# Patient Record
Sex: Female | Born: 1975 | Race: Black or African American | Hispanic: No | Marital: Single | Smoking: Never smoker
Health system: Southern US, Community
[De-identification: ages and names within clinical notes are randomized; demographics above are authoritative.]

## PROBLEM LIST (undated history)

## (undated) ENCOUNTER — Ambulatory Visit

## (undated) DIAGNOSIS — N390 Urinary tract infection, site not specified: Secondary | ICD-10-CM

## (undated) DIAGNOSIS — L0291 Cutaneous abscess, unspecified: Secondary | ICD-10-CM

## (undated) DIAGNOSIS — G8929 Other chronic pain: Secondary | ICD-10-CM

## (undated) DIAGNOSIS — E1140 Type 2 diabetes mellitus with diabetic neuropathy, unspecified: Secondary | ICD-10-CM

## (undated) DIAGNOSIS — D649 Anemia, unspecified: Secondary | ICD-10-CM

## (undated) DIAGNOSIS — H269 Unspecified cataract: Secondary | ICD-10-CM

## (undated) DIAGNOSIS — IMO0001 Reserved for inherently not codable concepts without codable children: Secondary | ICD-10-CM

## (undated) DIAGNOSIS — S72009A Fracture of unspecified part of neck of unspecified femur, initial encounter for closed fracture: Secondary | ICD-10-CM

## (undated) DIAGNOSIS — M549 Dorsalgia, unspecified: Secondary | ICD-10-CM

## (undated) DIAGNOSIS — B009 Herpesviral infection, unspecified: Secondary | ICD-10-CM

## (undated) DIAGNOSIS — G709 Myoneural disorder, unspecified: Secondary | ICD-10-CM

## (undated) DIAGNOSIS — M1990 Unspecified osteoarthritis, unspecified site: Secondary | ICD-10-CM

## (undated) DIAGNOSIS — F419 Anxiety disorder, unspecified: Secondary | ICD-10-CM

## (undated) DIAGNOSIS — Z9289 Personal history of other medical treatment: Secondary | ICD-10-CM

## (undated) DIAGNOSIS — T8859XA Other complications of anesthesia, initial encounter: Secondary | ICD-10-CM

## (undated) HISTORY — PX: EYE SURGERY: SHX253

## (undated) HISTORY — PX: BILATERAL SALPINGECTOMY: SHX5743

## (undated) HISTORY — PX: CATARACT EXTRACTION: SUR2

## (undated) HISTORY — PX: FRACTURE SURGERY: SHX138

---

## 2001-10-31 ENCOUNTER — Emergency Department (HOSPITAL_COMMUNITY): Admission: EM | Admit: 2001-10-31 | Discharge: 2001-10-31 | Payer: Self-pay | Admitting: Emergency Medicine

## 2001-10-31 ENCOUNTER — Encounter: Payer: Self-pay | Admitting: Emergency Medicine

## 2001-11-22 ENCOUNTER — Emergency Department (HOSPITAL_COMMUNITY): Admission: EM | Admit: 2001-11-22 | Discharge: 2001-11-23 | Payer: Self-pay | Admitting: Emergency Medicine

## 2001-12-15 ENCOUNTER — Other Ambulatory Visit: Admission: RE | Admit: 2001-12-15 | Discharge: 2001-12-15 | Payer: Self-pay | Admitting: Obstetrics and Gynecology

## 2001-12-30 ENCOUNTER — Encounter: Admission: RE | Admit: 2001-12-30 | Discharge: 2001-12-30 | Payer: Self-pay | Admitting: *Deleted

## 2002-01-06 ENCOUNTER — Encounter: Admission: RE | Admit: 2002-01-06 | Discharge: 2002-04-06 | Payer: Self-pay | Admitting: Obstetrics and Gynecology

## 2002-01-06 ENCOUNTER — Encounter: Admission: RE | Admit: 2002-01-06 | Discharge: 2002-01-06 | Payer: Self-pay | Admitting: *Deleted

## 2002-01-20 ENCOUNTER — Inpatient Hospital Stay (HOSPITAL_COMMUNITY): Admission: AD | Admit: 2002-01-20 | Discharge: 2002-01-20 | Payer: Self-pay | Admitting: Obstetrics and Gynecology

## 2002-01-21 ENCOUNTER — Encounter: Admission: RE | Admit: 2002-01-21 | Discharge: 2002-01-21 | Payer: Self-pay | Admitting: *Deleted

## 2002-01-27 ENCOUNTER — Encounter: Admission: RE | Admit: 2002-01-27 | Discharge: 2002-01-27 | Payer: Self-pay | Admitting: *Deleted

## 2002-02-02 ENCOUNTER — Encounter: Payer: Self-pay | Admitting: *Deleted

## 2002-02-02 ENCOUNTER — Ambulatory Visit (HOSPITAL_COMMUNITY): Admission: RE | Admit: 2002-02-02 | Discharge: 2002-02-02 | Payer: Self-pay | Admitting: *Deleted

## 2002-02-03 ENCOUNTER — Encounter: Admission: RE | Admit: 2002-02-03 | Discharge: 2002-02-03 | Payer: Self-pay | Admitting: *Deleted

## 2002-02-10 ENCOUNTER — Encounter: Admission: RE | Admit: 2002-02-10 | Discharge: 2002-02-10 | Payer: Self-pay | Admitting: *Deleted

## 2002-02-17 ENCOUNTER — Encounter: Admission: RE | Admit: 2002-02-17 | Discharge: 2002-02-17 | Payer: Self-pay | Admitting: *Deleted

## 2002-02-24 ENCOUNTER — Encounter: Admission: RE | Admit: 2002-02-24 | Discharge: 2002-02-24 | Payer: Self-pay | Admitting: *Deleted

## 2002-03-04 ENCOUNTER — Encounter: Admission: RE | Admit: 2002-03-04 | Discharge: 2002-03-04 | Payer: Self-pay | Admitting: *Deleted

## 2002-03-24 ENCOUNTER — Encounter: Admission: RE | Admit: 2002-03-24 | Discharge: 2002-03-24 | Payer: Self-pay | Admitting: *Deleted

## 2002-03-25 ENCOUNTER — Ambulatory Visit (HOSPITAL_COMMUNITY): Admission: RE | Admit: 2002-03-25 | Discharge: 2002-03-25 | Payer: Self-pay | Admitting: *Deleted

## 2002-04-07 ENCOUNTER — Encounter: Admission: RE | Admit: 2002-04-07 | Discharge: 2002-04-07 | Payer: Self-pay | Admitting: *Deleted

## 2002-04-21 ENCOUNTER — Encounter: Admission: RE | Admit: 2002-04-21 | Discharge: 2002-04-21 | Payer: Self-pay | Admitting: *Deleted

## 2002-05-06 ENCOUNTER — Encounter: Admission: RE | Admit: 2002-05-06 | Discharge: 2002-05-06 | Payer: Self-pay | Admitting: *Deleted

## 2002-05-07 ENCOUNTER — Ambulatory Visit (HOSPITAL_COMMUNITY): Admission: RE | Admit: 2002-05-07 | Discharge: 2002-05-07 | Payer: Self-pay | Admitting: *Deleted

## 2002-05-12 ENCOUNTER — Encounter: Admission: RE | Admit: 2002-05-12 | Discharge: 2002-05-12 | Payer: Self-pay | Admitting: *Deleted

## 2002-05-26 ENCOUNTER — Encounter: Admission: RE | Admit: 2002-05-26 | Discharge: 2002-05-26 | Payer: Self-pay | Admitting: *Deleted

## 2002-06-09 ENCOUNTER — Encounter (HOSPITAL_COMMUNITY): Admission: AD | Admit: 2002-06-09 | Discharge: 2002-07-09 | Payer: Self-pay | Admitting: *Deleted

## 2002-06-09 ENCOUNTER — Encounter: Admission: RE | Admit: 2002-06-09 | Discharge: 2002-06-09 | Payer: Self-pay | Admitting: *Deleted

## 2002-06-16 ENCOUNTER — Encounter: Admission: RE | Admit: 2002-06-16 | Discharge: 2002-06-16 | Payer: Self-pay | Admitting: *Deleted

## 2002-06-30 ENCOUNTER — Encounter: Admission: RE | Admit: 2002-06-30 | Discharge: 2002-06-30 | Payer: Self-pay | Admitting: *Deleted

## 2002-07-07 ENCOUNTER — Encounter: Admission: RE | Admit: 2002-07-07 | Discharge: 2002-07-07 | Payer: Self-pay | Admitting: *Deleted

## 2002-07-11 ENCOUNTER — Inpatient Hospital Stay (HOSPITAL_COMMUNITY): Admission: AD | Admit: 2002-07-11 | Discharge: 2002-07-16 | Payer: Self-pay | Admitting: Obstetrics and Gynecology

## 2002-07-14 ENCOUNTER — Encounter (INDEPENDENT_AMBULATORY_CARE_PROVIDER_SITE_OTHER): Payer: Self-pay | Admitting: Specialist

## 2002-07-20 ENCOUNTER — Inpatient Hospital Stay (HOSPITAL_COMMUNITY): Admission: AD | Admit: 2002-07-20 | Discharge: 2002-07-20 | Payer: Self-pay | Admitting: *Deleted

## 2002-07-25 ENCOUNTER — Inpatient Hospital Stay (HOSPITAL_COMMUNITY): Admission: AD | Admit: 2002-07-25 | Discharge: 2002-07-25 | Payer: Self-pay | Admitting: Family Medicine

## 2003-07-11 ENCOUNTER — Emergency Department (HOSPITAL_COMMUNITY): Admission: EM | Admit: 2003-07-11 | Discharge: 2003-07-11 | Payer: Self-pay | Admitting: Emergency Medicine

## 2003-07-25 ENCOUNTER — Other Ambulatory Visit: Admission: RE | Admit: 2003-07-25 | Discharge: 2003-07-25 | Payer: Self-pay | Admitting: Obstetrics and Gynecology

## 2005-01-01 ENCOUNTER — Other Ambulatory Visit: Admission: RE | Admit: 2005-01-01 | Discharge: 2005-01-01 | Payer: Self-pay | Admitting: Obstetrics and Gynecology

## 2005-03-07 ENCOUNTER — Inpatient Hospital Stay (HOSPITAL_COMMUNITY): Admission: EM | Admit: 2005-03-07 | Discharge: 2005-03-20 | Payer: Self-pay | Admitting: Emergency Medicine

## 2005-03-07 ENCOUNTER — Ambulatory Visit: Payer: Self-pay | Admitting: Physical Medicine & Rehabilitation

## 2005-03-20 ENCOUNTER — Inpatient Hospital Stay (HOSPITAL_COMMUNITY)
Admission: RE | Admit: 2005-03-20 | Discharge: 2005-03-29 | Payer: Self-pay | Admitting: Physical Medicine & Rehabilitation

## 2006-01-20 ENCOUNTER — Other Ambulatory Visit: Admission: RE | Admit: 2006-01-20 | Discharge: 2006-01-20 | Payer: Self-pay | Admitting: Obstetrics and Gynecology

## 2006-05-13 ENCOUNTER — Other Ambulatory Visit: Admission: RE | Admit: 2006-05-13 | Discharge: 2006-05-13 | Payer: Self-pay | Admitting: Obstetrics and Gynecology

## 2006-10-29 ENCOUNTER — Emergency Department (HOSPITAL_COMMUNITY): Admission: EM | Admit: 2006-10-29 | Discharge: 2006-10-29 | Payer: Self-pay | Admitting: Emergency Medicine

## 2007-01-26 ENCOUNTER — Encounter: Admission: RE | Admit: 2007-01-26 | Discharge: 2007-04-26 | Payer: Self-pay | Admitting: Obstetrics and Gynecology

## 2007-01-26 ENCOUNTER — Ambulatory Visit (HOSPITAL_COMMUNITY): Admission: RE | Admit: 2007-01-26 | Discharge: 2007-01-26 | Payer: Self-pay | Admitting: Obstetrics and Gynecology

## 2007-04-09 ENCOUNTER — Ambulatory Visit (HOSPITAL_COMMUNITY): Admission: RE | Admit: 2007-04-09 | Discharge: 2007-04-09 | Payer: Self-pay | Admitting: Obstetrics and Gynecology

## 2007-05-12 ENCOUNTER — Ambulatory Visit (HOSPITAL_COMMUNITY): Admission: RE | Admit: 2007-05-12 | Discharge: 2007-05-12 | Payer: Self-pay | Admitting: Obstetrics and Gynecology

## 2007-06-10 ENCOUNTER — Ambulatory Visit (HOSPITAL_COMMUNITY): Admission: RE | Admit: 2007-06-10 | Discharge: 2007-06-10 | Payer: Self-pay | Admitting: Obstetrics and Gynecology

## 2007-08-19 ENCOUNTER — Encounter (INDEPENDENT_AMBULATORY_CARE_PROVIDER_SITE_OTHER): Payer: Self-pay | Admitting: Obstetrics and Gynecology

## 2007-08-19 ENCOUNTER — Inpatient Hospital Stay (HOSPITAL_COMMUNITY): Admission: AD | Admit: 2007-08-19 | Discharge: 2007-08-23 | Payer: Self-pay | Admitting: Obstetrics and Gynecology

## 2007-12-27 ENCOUNTER — Emergency Department (HOSPITAL_COMMUNITY): Admission: EM | Admit: 2007-12-27 | Discharge: 2007-12-27 | Payer: Self-pay | Admitting: Emergency Medicine

## 2009-11-17 ENCOUNTER — Emergency Department (HOSPITAL_COMMUNITY): Admission: EM | Admit: 2009-11-17 | Discharge: 2009-11-17 | Payer: Self-pay | Admitting: Emergency Medicine

## 2009-11-17 ENCOUNTER — Encounter (INDEPENDENT_AMBULATORY_CARE_PROVIDER_SITE_OTHER): Payer: Self-pay | Admitting: Emergency Medicine

## 2009-11-17 ENCOUNTER — Ambulatory Visit: Payer: Self-pay | Admitting: Vascular Surgery

## 2010-10-07 ENCOUNTER — Encounter: Payer: Self-pay | Admitting: Obstetrics and Gynecology

## 2011-01-29 NOTE — Discharge Summary (Signed)
NAMEVEDANSHI, MASSARO           ACCOUNT NO.:  [id number]   MEDICAL RECORD NO.:  [id number]          PATIENT TYPE:  INP   LOCATION:  9306                          FACILITY:  [facility]   PHYSICIAN:  Naima A. Dillard, M.D. DATE OF BIRTH:  [birth year]   DATE OF ADMISSION:  08/19/2007  DATE OF DISCHARGE:  08/23/2007                               DISCHARGE SUMMARY   ADMISSION DIAGNOSES:  1. Intrauterine pregnancy at 39 weeks.  2. Insulin dependent diabetes.  3. Poor glucose control.  4. Obesity.  5. Poor compliance  6. Group B strep positive.   DISCHARGE DIAGNOSES:  1. Intrauterine pregnancy at 39 weeks.  2. Insulin dependent diabetes.  3. Poor glucose control.  4. Obesity.  5. Poor compliance  6. Group B strep positive.  7. Status post cesarean delivery of a female infant named Jamir weighing      6 pounds 10 ounces, Apgars 7 and 8.   HOSPITAL PROCEDURES:  1. Spinal anesthesia.  2. Repeat low transverse cesarean section.  3. Insulin drip  4. Blood sugar monitoring.   HOSPITAL COURSE:  The patient was admitted for repeat low transverse  cesarean section for above-listed reasons.  She had this done under  spinal anesthesia by Dr. Pennie Rushing.  AVL was 750 mL.  She was delivered of  a female infant weighing 6 pounds 10 ounces, Apgars 7 and 8, who was taken  to NICU.  She was taken to the women's unit and received routine postop  care.  Postop day #1, she was doing well, ambulating well, tolerating  food.  She did complain of some left shoulder bursitis which was  preexisting.  Blood sugars ranged 75-109.  Insulin drip was  discontinued, and she was begun on her Humulin 70/30 insulin regimen  from before.  She was seen by social work on that same day.  On postop  day #2, she continued to receive routine care.  Blood sugars ranged 67-  159.  She continued ambulation and routine care.  Postop day #3, she was  doing well, up and about with no problems.  Infant was still in NICU but  was doing  better.  JP was discontinued.  She had one episode of  hypoglycemia at 43 and insulin was decreased to 15 units in the morning  and 10 units at night, and she was also restarted on her Actos and  metformin.  Postop day #4, she was ready to go home.  Her vital signs  were stable.  Chest was clear.  Heart rate regular rate and rhythm.  Blood glucose ranged from 96-102.  Abdomen was soft and appropriately  tender.  Incision was clean, dry and intact with Steri-Strips.  JP site  was intact.  Lochia was small.  Extremities within normal limits.  She  was deemed to receive full benefit of hospital stay.  Discharged home.   CONDITION ON DISCHARGE:  Good.   DISCHARGE MEDICATIONS:  1. Motrin 600 mg p.o. q.6 h p.r.n.  2. Tylox one to two p.o. q.4 h p.r.n.  3. Actos 15 mg q.a.m.  4. Metformin 500 mg q.a.m.  5. Insulin Humulin 70/30 15 units in the morning and 10 units at      dinner.   DISCHARGE LABORATORIES:  Sodium 136, potassium 3.6, creatinine 0.6, AST  24, ALT 12, uric acid 4.9, hemoglobin 9.7, platelets 260.   DISCHARGE INSTRUCTIONS:  Per [facility] handout.   DISCHARGE FOLLOWUP:  To include visit with her internist in one week and  at [facility] in 6 weeks.      Marie L. Williams, C.N.M.      Naima A. Normand Sloop, M.D.  Electronically Signed    MLW/MEDQ  D:  08/23/2007  T:  08/24/2007  Job:  [id number]

## 2011-01-29 NOTE — Discharge Summary (Signed)
[name], [name]           ACCOUNT NO.:  [id number]   MEDICAL RECORD NO.:  [id number]          PATIENT TYPE:  INP   LOCATION:  9306                          [facility]  [facility]   PHYSICIAN:  Naima A. Dillard, M.D. DATE OF BIRTH:  Jun 06, 1976   DATE OF ADMISSION:  08/19/2007  DATE OF DISCHARGE:  08/23/2007                               DISCHARGE SUMMARY   ADMISSION DIAGNOSES:  1. Intrauterine pregnancy at [redacted] weeks gestation.  2. Insulin dependent diabetes.  3. Initiation of pregnancy with poor blood sugar control.  4. Obesity.  5. Poor compliance.  6. Group B strep positive.  7. Non-reactive NST with BPP 6/10.   DISCHARGE DIAGNOSES:  1. Intrauterine pregnancy at [redacted] weeks gestation.  2. Insulin dependent diabetes.  3. Initiation of pregnancy with poor blood sugar control.  4. Obesity.  5. Poor compliance.  6. Group B strep positive.  7. Non-reactive NST with BPP 6/10.  8. Status post Cesarean delivery of a female infant named Jamir,      weighing 6 pounds 10 ounces, Apgar's 7 and 8, who is in the NICU.   PROCEDURES:  1. Spinal anesthesia.  2. Repeat low transverse Cesarean section.  3. Insulin drip.  4. Blood sugar control.   HOSPITAL COURSE:  The patient was admitted for non-reactive NST with a  BPP score of 6/10. She was taken to the operating room where repeat low  transverse Cesarean section was performed under spinal anesthesia by Dr.  Pennie Rushing with birth of a female infant named Jamir, weighing 6 pounds 10  ounces. Apgar's 7 and 8. The baby was taken to the NICU. Mother was  taken to recovery and then to the third flood. On postoperative day 1,  she was doing well. She was pumping breast mild, ambulating without  difficulty and taking food without difficulty. She was having some  shoulder discomfort from a prior left shoulder bursitis. Labs were  within normal limits. Blood sugars ranged from 75 to 109. She was given  routine postoperative care. Insulin drip was  discontinued. She was  placed back on her Humulin insulin regimen. On postoperative day 2, she  was seen by social work for NICU support. Blood sugars ranged 67 to 159.  Vital signs were normal and she continued to receive routine  postoperative care. On postoperative day 3, she was feeling well. Infant  was doing well in the NICU. She had an episode of low blood sugar at 43  and decision was made to have her stay one more day in the hospital.  Other blood sugars were 104 to 130. Vital signs remained stable. Insulin  regimen was changed with a decrease of insulin to 15 units in the  morning and 10 units at night. Her Actos and Metformin were restarted,  as they were given before pregnancy. On postoperative day 4, she was  ready to go home. Blood sugars were ranging 96 to 102. Vital signs were  stable. Chest was clear. Heart regular rate and rhythm. Abdomen soft and  appropriately tender. Incision was clean, dry and intact with Steri-  Strips. Jackson-Pratt site was intact.  Jackson-Pratt was removed the day  before. Lochia was small. Extremities within normal limits. She was  deemed to have received full benefit of her hospital stay and was  discharged home.   CONDITION ON DISCHARGE:  Good.   DISCHARGE MEDICATIONS:  1. Motrin 600 mg p.o. q.6 hours.      Marie L. Williams, C.N.M.      Naima A. Normand Sloop, M.D.  Electronically Signed    MLW/MEDQ  D:  08/23/2007  T:  08/23/2007  Job:  [id number]

## 2011-01-29 NOTE — Op Note (Signed)
NAMEMICHAELENE, DUTAN           ACCOUNT NO.:  [id number]   MEDICAL RECORD NO.:  [id number]          PATIENT TYPE:  INP   LOCATION:  9306                          [facility]   PHYSICIAN:  Hal Morales, M.D.DATE OF BIRTH:  November 01, 1975   DATE OF PROCEDURE:  08/19/2007  DATE OF DISCHARGE:                               OPERATIVE REPORT   PREOPERATIVE DIAGNOSIS:  Intrauterine pregnancy at 53 weeks' gestation.  Insulin-dependent diabetes.  Prior cesarean section with desire for  repeat.  Abnormal antepartum testing.   POSTOPERATIVE DIAGNOSES:  Intrauterine pregnancy at 65 weeks' gestation.  Insulin-dependent diabetes.  Prior cesarean section with desire for  repeat.  Abnormal antepartum testing.   OPERATION:  Repeat low transverse cesarean section.   SURGEON:  Dr. Dierdre Forth.   FIRST ASSISTANT:  Erin Sons, certified nurse midwife.   ANESTHESIA:  Spinal.   ESTIMATED BLOOD LOSS:  750 mL.   COMPLICATIONS:  None.   FINDINGS:  The patient was delivered of a female infant whose name is  Jamir weighing 6 pounds 10.8 ounces with Apgars of 7 and 8 at one and  five minutes respectively.  The placenta contained an eccentrically  inserted three-vessel cord.  The uterus, tubes and ovaries were normal  for the gravid state.   PREOPERATIVE DISCUSSION:  The patient had been seen at [facility] for routine antepartum testing consisting of nonstress testing  which was nonreactive.  A biophysical profile was scored at 8/10.  In  light of these findings and the patient having poor control of her blood  sugars during her pregnancy at 53 weeks' gestation, the decision was  made to proceed toward delivery.  A discussion was held with the patient  with that recommendation and a discussion of the risks of anesthesia,  bleeding, infection and damage to adjacent organs was undertaken.  The  patient consented to proceed.   PROCEDURE:  The patient was taken to the operating  room after  appropriate identification and placed on the operating table.  A spinal  anesthetic was placed and she was placed in the supine position with a  left lateral tilt.  The abdomen, perineum were prepped with multiple  layers of Betadine and a Foley catheter inserted into the bladder and  connected to straight drainage.  The abdomen was draped as a sterile  field.  After assurance of adequate anesthesia the previous incision was  infiltrated with 20 mL of 0.25% Marcaine.  The incision was removed by  incising above and below the incision and sharply excising the skin and  underlying adipose tissue.  The remainder of the abdomen was opened and  the peritoneum entered.  The bladder blade was placed and the uterus was  incised approximately 2 cm above the uterovesical fold and that incision  taken laterally on either side bluntly.  The infant was delivered from  the occiput transverse position and after reduction of the loose nuchal  cord was delivered onto the mother's abdomen.  The cord was clamped and  cut and the infant handed off to the awaiting pediatricians.  The  appropriate cord blood  was drawn and the placenta noted to have  separated from the uterus and was removed from the operative field.  The  uterine incision was closed with a running interlocking suture of 0  Vicryl.  An imbricating suture of 0 Vicryl was placed.  Hemostasis was  achieved with several figure-of-eight sutures of 0 Vicryl.  Copious  irrigation was carried out.  The abdominal peritoneum was closed with a  running suture of 2-0 Vicryl.  The rectus muscles were reapproximated in  the midline with figure-of-eight suture of 2-0 Vicryl.  The rectus  fascia was closed with a running suture of 0 Vicryl and reinforced on  either side of midline with figure-of-eight sutures of 0 Vicryl.  The  subcutaneous tissue was irrigated and made hemostatic with Bovie  cautery.  The subcutaneous Jackson-Pratt drain was then  placed through a  stab incision in the left lower quadrant and sewn in with a suture of 0  silk.  The skin incision was closed with a subcuticular suture of 3-0  Monocryl.  Steri-Strips were applied.  A sterile dressing was applied  and the patient taken from the operating room to the recovery room in  satisfactory condition having time tolerated the procedure well with  sponge and instrument counts correct.   SPECIMENS TO PATHOLOGY:  Placenta.   The infant went to the NICU because of retracting and was to be observed  there.      Hal Morales, M.D.  Electronically Signed     VPH/MEDQ  D:  08/19/2007  T:  08/20/2007  Job:  [id number]
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
## 2011-02-01 NOTE — Consult Note (Signed)
   NAMEMAYTAL, MIJANGOS NO.:  [id number]   MEDICAL RECORD NO.:  [id number]                   PATIENT TYPE:   LOCATION:                                       FACILITY:   PHYSICIAN:  Nani Gasser, M.D.            DATE OF BIRTH:  Oct 30, 1975   DATE OF CONSULTATION:  DATE OF DISCHARGE:  07/16/2002                                   CONSULTATION   DISCHARGE DIAGNOSES:  1. Non-reassuring fetal stroke.  2. Insulin dependent diabetes mellitus.  3. Anemia.  4. Delivery of a single live born infant.  5. Induction of labor.   DISCHARGE MEDICATIONS:  1. Ibuprofen 600 mg one by mouth every six hours as needed.  2. Percocet 5/325 1-2 tabs every four hours as needed pain.  3. Iron tablet one by mouth twice a day.  4. Ortho-Evra patch.  5. Continue with current insulin regimen.  6. Prenatal vitamins one tab per day for six weeks.   FOLLOW UP:  She is to follow-up in six weeks at [facility] and to keep  follow-up appointment with Dr. Allyne Gee for her diabetes mellitus.   ACTIVITY:  No heavy lifting for six weeks. Nothing in vagina for six weeks.   HOSPITAL COURSE:  The patient is a 35 year old [place], Para I admitted  for induction of labor at 58 and 5/7th weeks. The patient received Cytotec  and Pitocin and induction. She also received Penicillin for Group B strep  positive status. She did also receive Stadol for pain medication and  Phenergan. She failed to progress and then was taken to the OR for low  transverse Cesarean section under spinal anesthesia. She delivered a girl  with Apgar's of 9 at one minute and 9 at five minutes. The patient also  received Cefotetan times two. The patient was also placed on __ during  labor. The patient was discharged on postoperative day three and staples  were removed at that time. The patient was bottle feeding and wanted Ortho-  Evra patch for contraception.     Nani Gasser, M.D.    CM/MEDQ  D:  11/04/2002  T:  11/04/2002  Job:  [id number]
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
## 2011-02-01 NOTE — Op Note (Signed)
   NAME:  [name], [name]                     ACCOUNT NO.:  [id number]   MEDICAL RECORD NO.:  [id number]                   PATIENT TYPE:  INP   LOCATION:  9145                                 FACILITY:  [facility]   PHYSICIAN:  Conni Elliot, M.D.             DATE OF BIRTH:  02-Nov-1975   DATE OF PROCEDURE:  07/13/2002  DATE OF DISCHARGE:  07/16/2002                                 OPERATIVE REPORT   PREOPERATIVE DIAGNOSES:  1. Repetitive variables with late component.  2. Arrest of active phase of labor.   POSTOPERATIVE DIAGNOSES:  1. Repetitive variables with late component.  2. Arrest of active phase of labor.  3. Persistent occiput posterior position.   SURGEON:  Conni Elliot, M.D.   ANESTHESIA:  Continuous lumbar epidural.   DESCRIPTION OF PROCEDURE:  The patient was brought to the operating room in  supine left lateral tilt position, given oxygen, and was prepped and draped  in a sterile fashion.  A low transverse Pfannenstiel incision was made.  Incision made through the skin and fascia, rectus muscles separated in the  midline, peritoneum entered, and bladder flap created.  A low transverse  uterine incision made.  The baby was delivered from a vertex presentation.  Cord double clamped and cut and the baby was given to the pediatrician.  The  placenta delivered spontaneously.  The uterus, bladder flap, anterior  peritoneum, fascia, and skin were closed in the usual fashion.  Estimated  blood loss less than 800 cc.  Sponge and instrument counts correct.                                                Conni Elliot, M.D.    ASG/MEDQ  D:  09/08/2002  T:  09/10/2002  Job:  [id number]

## 2011-06-24 LAB — COMPREHENSIVE METABOLIC PANEL
ALT: 12
Calcium: 8.8
Creatinine, Ser: 0.6
GFR calc non Af Amer: >60
Potassium: 3.6
Total Bilirubin: 0.4

## 2011-06-24 LAB — CBC
HCT: 29.1 — ABNORMAL LOW
Hemoglobin: 9.7 — ABNORMAL LOW
MCHC: 33.6
MCV: 79.3
Platelets: 261
RBC: 3.63 — ABNORMAL LOW
RDW: 14.8
WBC: 11.4 — ABNORMAL HIGH
WBC: 7.7

## 2011-06-24 LAB — LACTATE DEHYDROGENASE: LDH: 142

## 2011-09-17 DIAGNOSIS — Z9289 Personal history of other medical treatment: Secondary | ICD-10-CM

## 2011-09-17 HISTORY — DX: Personal history of other medical treatment: Z92.89

## 2012-06-08 ENCOUNTER — Other Ambulatory Visit: Payer: Self-pay | Admitting: Ophthalmology

## 2012-06-08 NOTE — H&P (Signed)
  Pre-operative History and Physical for Ophthalmic Surgery  [name] 06/08/2012                  Chief Complaint: Decreased vision  Diagnosis: Combined Cataract Right Eye  No known Allergies  Prior to Admission medications   Not on File    Planned Procedure:                                       Phacoemulsification, Posterior Chamber Intra-ocular Lens Right Eye                                      Acrysof MA50BM  + 16.50 Diopter PC IOL for implant   There were no vitals filed for this visit.  Pulse: 78         Temp: NE        Resp:  16       ROS: negative  No past medical history on file.  No past surgical history on file.   History   Social History  . Marital Status: Single    Spouse Name: N/A    Number of Children: N/A  . Years of Education: N/A   Occupational History  . Not on file.   Social History Main Topics  . Smoking status: Not on file  . Smokeless tobacco: Not on file  . Alcohol Use: Not on file  . Drug Use: Not on file  . Sexually Active: Not on file   Other Topics Concern  . Not on file   Social History Narrative  . No narrative on file     The following examination is for anesthesia clearance for minimally invasive Ophthalmic surgery. It is primarily to document heart and lung findings and is not intended to elucidate unknown general medical conditions inclusive of abdominal masses, lung lesions, etc.   General Constitution:  within normal limits   Alertness/Orientation:  Person, time place     yes   HEENT:  Eye Findings: Combined Cataract                    right eye  Neck: supple without masses  Chest/Lungs: clear to auscultation  Cardiac: Normal S1 and S2 without Murmur, S3 or S4  Neuro: non-focal  Impression:  Combined Cataract   Planned Procedure:  Phacoemulsification, Posterior Chamber Intraocular Lens, Right Eye    [name], MD        

## 2012-06-09 ENCOUNTER — Encounter (HOSPITAL_COMMUNITY): Payer: Self-pay | Admitting: Pharmacy Technician

## 2012-06-10 ENCOUNTER — Encounter (HOSPITAL_COMMUNITY): Payer: Self-pay

## 2012-06-12 ENCOUNTER — Ambulatory Visit (HOSPITAL_COMMUNITY): Payer: Medicaid Other | Admitting: *Deleted

## 2012-06-12 ENCOUNTER — Encounter (HOSPITAL_COMMUNITY): Payer: Self-pay | Admitting: *Deleted

## 2012-06-12 ENCOUNTER — Encounter (HOSPITAL_COMMUNITY): Payer: Self-pay | Admitting: Anesthesiology

## 2012-06-12 ENCOUNTER — Encounter (HOSPITAL_COMMUNITY)
Admission: RE | Disposition: A | Discharge status: Home / Self Care | Payer: Self-pay | Source: Ambulatory Visit | Attending: Ophthalmology

## 2012-06-12 ENCOUNTER — Ambulatory Visit (HOSPITAL_COMMUNITY)
Admission: RE | Admit: 2012-06-12 | Discharge: 2012-06-12 | Disposition: A | Discharge status: Home / Self Care | Payer: Medicaid Other | Source: Ambulatory Visit | Attending: Ophthalmology | Admitting: Ophthalmology

## 2012-06-12 DIAGNOSIS — Z794 Long term (current) use of insulin: Secondary | ICD-10-CM | POA: Insufficient documentation

## 2012-06-12 DIAGNOSIS — E119 Type 2 diabetes mellitus without complications: Secondary | ICD-10-CM | POA: Insufficient documentation

## 2012-06-12 DIAGNOSIS — H269 Unspecified cataract: Secondary | ICD-10-CM | POA: Insufficient documentation

## 2012-06-12 HISTORY — DX: Type 2 diabetes mellitus with diabetic neuropathy, unspecified: E11.40

## 2012-06-12 HISTORY — DX: Unspecified cataract: H26.9

## 2012-06-12 HISTORY — DX: Unspecified osteoarthritis, unspecified site: M19.90

## 2012-06-12 HISTORY — PX: CATARACT EXTRACTION W/PHACO: SHX586

## 2012-06-12 HISTORY — DX: Urinary tract infection, site not specified: N39.0

## 2012-06-12 HISTORY — DX: Other chronic pain: G89.29

## 2012-06-12 HISTORY — DX: Herpesviral infection, unspecified: B00.9

## 2012-06-12 HISTORY — DX: Fracture of unspecified part of neck of unspecified femur, initial encounter for closed fracture: S72.009A

## 2012-06-12 HISTORY — DX: Dorsalgia, unspecified: M54.9

## 2012-06-12 LAB — BASIC METABOLIC PANEL
BUN: 10 mg/dL (ref 6–23)
Chloride: 100 mEq/L (ref 96–112)
GFR calc Af Amer: >90 mL/min (ref >90)
Potassium: 3.6 mEq/L (ref 3.5–5.1)

## 2012-06-12 LAB — CBC
HCT: 35.1 % — ABNORMAL LOW (ref 36.0–46.0)
Hemoglobin: 11.9 g/dL — ABNORMAL LOW (ref 12.0–15.0)
WBC: 9.7 10*3/uL (ref 4.0–10.5)

## 2012-06-12 SURGERY — PHACOEMULSIFICATION, CATARACT, WITH IOL INSERTION
Anesthesia: Monitor Anesthesia Care | Site: Eye | Laterality: Right | Wound class: Clean

## 2012-06-12 MED ORDER — BACITRACIN-POLYMYXIN B 500-10000 UNIT/GM OP OINT
TOPICAL_OINTMENT | OPHTHALMIC | Status: DC | PRN
  Administered 2012-06-12: 1 via OPHTHALMIC

## 2012-06-12 MED ORDER — BUPIVACAINE HCL 0.75 % IJ SOLN
INTRAMUSCULAR | Status: AC
  Filled 2012-06-12: qty 10

## 2012-06-12 MED ORDER — SODIUM HYALURONATE 10 MG/ML IO SOLN
INTRAOCULAR | Status: AC
  Filled 2012-06-12: qty 0.85

## 2012-06-12 MED ORDER — CEFAZOLIN SUBCONJUNCTIVAL INJECTION 100 MG/0.5 ML
200.0000 mg | INJECTION | Freq: Once | SUBCONJUNCTIVAL | Status: DC
  Filled 2012-06-12: qty 1

## 2012-06-12 MED ORDER — ONDANSETRON HCL 4 MG/2ML IJ SOLN: 4.0000 mg | Freq: Once | INTRAMUSCULAR | Status: DC | PRN

## 2012-06-12 MED ORDER — TETRACAINE HCL 0.5 % OP SOLN
OPHTHALMIC | Status: AC
  Filled 2012-06-12: qty 2

## 2012-06-12 MED ORDER — MIDAZOLAM HCL 5 MG/5ML IJ SOLN
INTRAMUSCULAR | Status: DC | PRN
  Administered 2012-06-12: 1 mg via INTRAVENOUS

## 2012-06-12 MED ORDER — LIDOCAINE HCL (CARDIAC) 20 MG/ML IV SOLN
INTRAVENOUS | Status: DC | PRN
  Administered 2012-06-12: 60 mg via INTRAVENOUS

## 2012-06-12 MED ORDER — ACETAZOLAMIDE SODIUM 500 MG IJ SOLR
INTRAMUSCULAR | Status: AC
  Filled 2012-06-12: qty 500

## 2012-06-12 MED ORDER — CEFAZOLIN SUBCONJUNCTIVAL INJECTION 100 MG/0.5 ML
200.0000 mg | INJECTION | Freq: Once | SUBCONJUNCTIVAL | Status: AC
  Administered 2012-06-12: 200 mg via SUBCONJUNCTIVAL
  Filled 2012-06-12: qty 1

## 2012-06-12 MED ORDER — FENTANYL CITRATE 0.05 MG/ML IJ SOLN
INTRAMUSCULAR | Status: DC | PRN
  Administered 2012-06-12 (×2): 50 ug via INTRAVENOUS
  Administered 2012-06-12: 100 ug via INTRAVENOUS

## 2012-06-12 MED ORDER — HYPROMELLOSE (GONIOSCOPIC) 2.5 % OP SOLN
OPHTHALMIC | Status: DC | PRN
  Administered 2012-06-12: 2 [drp] via OPHTHALMIC

## 2012-06-12 MED ORDER — TETRACAINE HCL 0.5 % OP SOLN
2.0000 [drp] | OPHTHALMIC | Status: AC
  Administered 2012-06-12: 2 [drp] via OPHTHALMIC

## 2012-06-12 MED ORDER — GLYCOPYRROLATE 0.2 MG/ML IJ SOLN
INTRAMUSCULAR | Status: DC | PRN
  Administered 2012-06-12: 0.4 mg via INTRAVENOUS

## 2012-06-12 MED ORDER — TRIAMCINOLONE ACETONIDE 40 MG/ML IJ SUSP
INTRAMUSCULAR | Status: AC
  Filled 2012-06-12: qty 1

## 2012-06-12 MED ORDER — PREDNISOLONE ACETATE 1 % OP SUSP
OPHTHALMIC | Status: AC
  Filled 2012-06-12: qty 5

## 2012-06-12 MED ORDER — LIDOCAINE HCL 2 % IJ SOLN
INTRAMUSCULAR | Status: AC
  Filled 2012-06-12: qty 20

## 2012-06-12 MED ORDER — ROCURONIUM BROMIDE 100 MG/10ML IV SOLN
INTRAVENOUS | Status: DC | PRN
  Administered 2012-06-12: 35 mg via INTRAVENOUS

## 2012-06-12 MED ORDER — BSS IO SOLN
INTRAOCULAR | Status: AC
  Filled 2012-06-12: qty 500

## 2012-06-12 MED ORDER — LIDOCAINE HCL (PF) 2 % IJ SOLN
INTRAMUSCULAR | Status: DC | PRN
  Administered 2012-06-12: 2 mL

## 2012-06-12 MED ORDER — GATIFLOXACIN 0.5 % OP SOLN
1.0000 [drp] | OPHTHALMIC | Status: AC | PRN
  Administered 2012-06-12 (×3): 1 [drp] via OPHTHALMIC

## 2012-06-12 MED ORDER — HYDROMORPHONE HCL PF 1 MG/ML IJ SOLN
0.2500 mg | INTRAMUSCULAR | Status: DC | PRN
  Administered 2012-06-12: 0.5 mg via INTRAVENOUS

## 2012-06-12 MED ORDER — BUPIVACAINE HCL 0.75 % IJ SOLN
INTRAMUSCULAR | Status: DC | PRN
  Administered 2012-06-12: 2 mL

## 2012-06-12 MED ORDER — PHENYLEPHRINE HCL 2.5 % OP SOLN
1.0000 [drp] | OPHTHALMIC | Status: AC | PRN
  Administered 2012-06-12 (×3): 1 [drp] via OPHTHALMIC

## 2012-06-12 MED ORDER — EPINEPHRINE HCL 1 MG/ML IJ SOLN
INTRAMUSCULAR | Status: DC | PRN
  Administered 2012-06-12: .3 mL

## 2012-06-12 MED ORDER — WATER FOR IRRIGATION, STERILE IR SOLN
Status: DC | PRN
  Administered 2012-06-12: 300 mL

## 2012-06-12 MED ORDER — HYDROMORPHONE HCL PF 1 MG/ML IJ SOLN
INTRAMUSCULAR | Status: AC
  Filled 2012-06-12: qty 1

## 2012-06-12 MED ORDER — ONDANSETRON HCL 4 MG/2ML IJ SOLN
INTRAMUSCULAR | Status: DC | PRN
  Administered 2012-06-12: 4 mg via INTRAVENOUS

## 2012-06-12 MED ORDER — DEXAMETHASONE SODIUM PHOSPHATE 10 MG/ML IJ SOLN
INTRAMUSCULAR | Status: AC
  Filled 2012-06-12: qty 1

## 2012-06-12 MED ORDER — DEXAMETHASONE SODIUM PHOSPHATE 10 MG/ML IJ SOLN
INTRAMUSCULAR | Status: DC | PRN
  Administered 2012-06-12: 10 mg

## 2012-06-12 MED ORDER — SODIUM CHLORIDE 0.9 % IV SOLN
INTRAVENOUS | Status: DC | PRN
  Administered 2012-06-12: 08:00:00 via INTRAVENOUS

## 2012-06-12 MED ORDER — EPINEPHRINE HCL 1 MG/ML IJ SOLN
INTRAMUSCULAR | Status: AC
  Filled 2012-06-12: qty 1

## 2012-06-12 MED ORDER — BALANCED SALT IO SOLN
INTRAOCULAR | Status: DC | PRN
  Administered 2012-06-12: 15 mL via INTRAOCULAR

## 2012-06-12 MED ORDER — OXYCODONE HCL 5 MG PO TABS: 5.0000 mg | ORAL_TABLET | Freq: Once | ORAL | Status: DC | PRN

## 2012-06-12 MED ORDER — PHENYLEPHRINE HCL 2.5 % OP SOLN
OPHTHALMIC | Status: AC
  Filled 2012-06-12: qty 3

## 2012-06-12 MED ORDER — OXYCODONE HCL 5 MG/5ML PO SOLN: 5.0000 mg | Freq: Once | ORAL | Status: DC | PRN

## 2012-06-12 MED ORDER — BACITRACIN-POLYMYXIN B 500-10000 UNIT/GM OP OINT
TOPICAL_OINTMENT | OPHTHALMIC | Status: AC
  Filled 2012-06-12: qty 3.5

## 2012-06-12 MED ORDER — PROVISC 10 MG/ML IO SOLN
INTRAOCULAR | Status: DC | PRN
  Administered 2012-06-12: .85 mL via INTRAOCULAR

## 2012-06-12 MED ORDER — GATIFLOXACIN 0.5 % OP SOLN
OPHTHALMIC | Status: AC
  Filled 2012-06-12: qty 2.5

## 2012-06-12 MED ORDER — NA CHONDROIT SULF-NA HYALURON 40-30 MG/ML IO SOLN
INTRAOCULAR | Status: AC
  Filled 2012-06-12: qty 0.5

## 2012-06-12 MED ORDER — PROPOFOL 10 MG/ML IV BOLUS
INTRAVENOUS | Status: DC | PRN
  Administered 2012-06-12: 200 mg via INTRAVENOUS

## 2012-06-12 MED ORDER — NA CHONDROIT SULF-NA HYALURON 40-30 MG/ML IO SOLN
INTRAOCULAR | Status: DC | PRN
  Administered 2012-06-12: 0.5 mL via INTRAOCULAR

## 2012-06-12 MED ORDER — HYPROMELLOSE (GONIOSCOPIC) 2.5 % OP SOLN
OPHTHALMIC | Status: AC
  Filled 2012-06-12: qty 15

## 2012-06-12 MED ORDER — NEOSTIGMINE METHYLSULFATE 1 MG/ML IJ SOLN
INTRAMUSCULAR | Status: DC | PRN
  Administered 2012-06-12: 3 mg via INTRAVENOUS

## 2012-06-12 MED ORDER — PREDNISOLONE ACETATE 1 % OP SUSP
1.0000 [drp] | OPHTHALMIC | Status: AC
  Administered 2012-06-12: 1 [drp] via OPHTHALMIC

## 2012-06-12 SURGICAL SUPPLY — 64 items
APL SRG 3 HI ABS STRL LF PLS (MISCELLANEOUS) ×1
APPLICATOR COTTON TIP 6IN STRL (MISCELLANEOUS) ×2 IMPLANT
APPLICATOR DR MATTHEWS STRL (MISCELLANEOUS) ×2 IMPLANT
BAG FLD CLT MN 6.25X3.5 (WOUND CARE) ×1
BAG MINI COLL DRAIN (WOUND CARE) ×2 IMPLANT
BLADE EYE MINI 60D BEAVER (BLADE) IMPLANT
BLADE KERATOME 2.75 (BLADE) ×2 IMPLANT
BLADE STAB KNIFE 15DEG (BLADE) IMPLANT
CANNULA ANTERIOR CHAMBER 27GA (MISCELLANEOUS) IMPLANT
CLOTH BEACON ORANGE TIMEOUT ST (SAFETY) ×2 IMPLANT
DRAPE OPHTHALMIC 77X100 STRL (CUSTOM PROCEDURE TRAY) ×2 IMPLANT
DRAPE POUCH INSTRU U-SHP 10X18 (DRAPES) ×2 IMPLANT
DRSG TEGADERM 4X4.75 (GAUZE/BANDAGES/DRESSINGS) ×2 IMPLANT
FILTER BLUE MILLIPORE (MISCELLANEOUS) IMPLANT
GLOVE BIO SURGEON STRL SZ7.5 (GLOVE) ×1 IMPLANT
GLOVE BIOGEL PI IND STRL 7.5 (GLOVE) IMPLANT
GLOVE BIOGEL PI INDICATOR 7.5 (GLOVE) ×1
GLOVE SS BIOGEL STRL SZ 6.5 (GLOVE) ×1 IMPLANT
GLOVE SUPERSENSE BIOGEL SZ 6.5 (GLOVE) ×1
GOWN SRG XL XLNG 56XLVL 4 (GOWN DISPOSABLE) ×1 IMPLANT
GOWN STRL NON-REIN LRG LVL3 (GOWN DISPOSABLE) ×3 IMPLANT
GOWN STRL NON-REIN XL XLG LVL4 (GOWN DISPOSABLE) ×2
KIT BASIN OR (CUSTOM PROCEDURE TRAY) ×2 IMPLANT
KIT ROOM TURNOVER OR (KITS) IMPLANT
KNIFE GRIESHABER SHARP 2.5MM (MISCELLANEOUS) ×2 IMPLANT
LENS INTRAOCULAR MA50BM 20.0 (Intraocular Lens) ×1 IMPLANT
NDL 18GX1X1/2 (RX/OR ONLY) (NEEDLE) IMPLANT
NDL 25GX 5/8IN NON SAFETY (NEEDLE) ×1 IMPLANT
NDL FILTER BLUNT 18X1 1/2 (NEEDLE) IMPLANT
NDL HYPO 30X.5 LL (NEEDLE) ×2 IMPLANT
NEEDLE 18GX1X1/2 (RX/OR ONLY) (NEEDLE) ×2 IMPLANT
NEEDLE 22X1 1/2 (OR ONLY) (NEEDLE) ×2 IMPLANT
NEEDLE 25GX 5/8IN NON SAFETY (NEEDLE) ×2 IMPLANT
NEEDLE FILTER BLUNT 18X 1/2SAF (NEEDLE) ×1
NEEDLE FILTER BLUNT 18X1 1/2 (NEEDLE) ×1 IMPLANT
NEEDLE HYPO 30X.5 LL (NEEDLE) ×4 IMPLANT
NS IRRIG 1000ML POUR BTL (IV SOLUTION) ×2 IMPLANT
PACK CATARACT CUSTOM (CUSTOM PROCEDURE TRAY) ×2 IMPLANT
PACK CATARACT MCHSCP (PACKS) ×2 IMPLANT
PACK COMBINED CATERACT/VIT 23G (OPHTHALMIC RELATED) IMPLANT
PAD ARMBOARD 7.5X6 YLW CONV (MISCELLANEOUS) ×3 IMPLANT
PAD EYE OVAL STERILE LF (GAUZE/BANDAGES/DRESSINGS) ×1 IMPLANT
PHACO TIP KELMAN 45DEG (TIP) ×2 IMPLANT
PROBE ANTERIOR 20G W/INFUS NDL (MISCELLANEOUS) IMPLANT
ROLLS DENTAL (MISCELLANEOUS) IMPLANT
SHUTTLE MONARCH TYPE A (NEEDLE) ×2 IMPLANT
SOLUTION ANTI FOG 6CC (MISCELLANEOUS) IMPLANT
SPEAR EYE SURG WECK-CEL (MISCELLANEOUS) ×2 IMPLANT
SUT ETHILON 10-0 CS-B-6CS-B-6 (SUTURE)
SUT ETHILON 5 0 P 3 18 (SUTURE)
SUT ETHILON 9 0 TG140 8 (SUTURE) IMPLANT
SUT NYLON ETHILON 5-0 P-3 1X18 (SUTURE) IMPLANT
SUT PLAIN 6 0 TG1408 (SUTURE) IMPLANT
SUT POLY NON ABSORB 10-0 8 STR (SUTURE) IMPLANT
SUT VICRYL 6 0 S 29 12 (SUTURE) IMPLANT
SUTURE EHLN 10-0 CS-B-6CS-B-6 (SUTURE) IMPLANT
SYR 20CC LL (SYRINGE) IMPLANT
SYR 5ML LL (SYRINGE) IMPLANT
SYR TB 1ML LUER SLIP (SYRINGE) ×1 IMPLANT
SYRINGE 10CC LL (SYRINGE) IMPLANT
TAPE PAPER MEDFIX 1IN X 10YD (GAUZE/BANDAGES/DRESSINGS) ×1 IMPLANT
TOWEL OR 17X24 6PK STRL BLUE (TOWEL DISPOSABLE) ×4 IMPLANT
WATER STERILE IRR 1000ML POUR (IV SOLUTION) ×2 IMPLANT
WIPE INSTRUMENT VISIWIPE 73X73 (MISCELLANEOUS) ×2 IMPLANT

## 2012-06-12 NOTE — Anesthesia Preprocedure Evaluation (Addendum)
Anesthesia Evaluation  Patient identified by MRN, date of birth, ID band Patient awake    Reviewed: Allergy & Precautions, H&P , NPO status , Patient's Chart, lab work & pertinent test results  Airway Mallampati: II TM Distance: >3 FB Neck ROM: Full    Dental  (+) Teeth Intact and Dental Advisory Given   Pulmonary  breath sounds clear to auscultation  Pulmonary exam normal       Cardiovascular Rhythm:Regular Rate:Normal     Neuro/Psych    GI/Hepatic   Endo/Other  diabetes, Well Controlled, Type 2, Insulin DependentMorbid obesity  Renal/GU      Musculoskeletal   Abdominal Normal abdominal exam  (+)   Peds  Hematology   Anesthesia Other Findings   Reproductive/Obstetrics                          Anesthesia Physical Anesthesia Plan  ASA: III  Anesthesia Plan: General   Post-op Pain Management:    Induction: Intravenous  Airway Management Planned: Oral ETT  Additional Equipment:   Intra-op Plan:   Post-operative Plan: Extubation in OR  Informed Consent: I have reviewed the patients History and Physical, chart, labs and discussed the procedure including the risks, benefits and alternatives for the proposed anesthesia with the patient or authorized representative who has indicated his/her understanding and acceptance.   Dental advisory given  Plan Discussed with: CRNA, Anesthesiologist and Surgeon  Anesthesia Plan Comments:       Anesthesia Quick Evaluation

## 2012-06-12 NOTE — H&P (View-Only) (Signed)
  Pre-operative History and Physical for Ophthalmic Surgery  [name] 06/08/2012                  Chief Complaint: Decreased vision  Diagnosis: Combined Cataract Right Eye  No known Allergies  Prior to Admission medications   Not on File    Planned Procedure:                                       Phacoemulsification, Posterior Chamber Intra-ocular Lens Right Eye                                      Acrysof MA50BM  + 16.50 Diopter PC IOL for implant   There were no vitals filed for this visit.  Pulse: 78         Temp: NE        Resp:  16       ROS: negative  No past medical history on file.  No past surgical history on file.   History   Social History  . Marital Status: Single    Spouse Name: N/A    Number of Children: N/A  . Years of Education: N/A   Occupational History  . Not on file.   Social History Main Topics  . Smoking status: Not on file  . Smokeless tobacco: Not on file  . Alcohol Use: Not on file  . Drug Use: Not on file  . Sexually Active: Not on file   Other Topics Concern  . Not on file   Social History Narrative  . No narrative on file     The following examination is for anesthesia clearance for minimally invasive Ophthalmic surgery. It is primarily to document heart and lung findings and is not intended to elucidate unknown general medical conditions inclusive of abdominal masses, lung lesions, etc.   General Constitution:  within normal limits   Alertness/Orientation:  Person, time place     yes   HEENT:  Eye Findings: Combined Cataract                    right eye  Neck: supple without masses  Chest/Lungs: clear to auscultation  Cardiac: Normal S1 and S2 without Murmur, S3 or S4  Neuro: non-focal  Impression:  Combined Cataract   Planned Procedure:  Phacoemulsification, Posterior Chamber Intraocular Lens, Right Eye    [name], MD

## 2012-06-12 NOTE — Preoperative (Signed)
Beta Blockers   Reason not to administer Beta Blockers:Not Applicable 

## 2012-06-12 NOTE — Op Note (Signed)
Lillieana Saito 06/12/2012 Combined Cataract Right Eye  Procedure: Phacoemulsification, Posterior Chamber Intra-ocular Lens Operative Eye:  right eye  Surgeon: [name] Estimated Blood Loss: minimal Specimens for Pathology:  None Complications: anterior capsule radial  tear  The patient was prepared and draped in the usual manner for ocular surgery on the right eye. A Cook lid speculum was placed. A peripheral clear corneal incision was made at the surgical limbus centered at the 11:00 meridian. A separate clear corneal stab incision was made with a 15 degree blade at the 2:00 meridian to permit bi-manual technique. Provisc was instilled into the anterior chamber through that incision.  A keratome was used to create a self sealing incision entering the anterior chamber at the 11:00 meridian. A capsulorhexis was performed using a bent 25g needle. A radial anterior capsule tear developed at 5:30 at the site where the capsulorhexis was initiated.  The lens was hydrodissected and the nucleus was hydrodilineated using a Nichammin cannula. The Chang chopper was inserted and used to rotate the lens to insure adequate lens mobility. The phacoemulsification handpiece was inserted and a combined phaco-chop technique was employed, fracturing the lens into separate sections with subsequent removal with the phaco handpiece.   The I/A cannula was used to remove remaining lens cortex. Provisc was instilled and used to deepen the anterior chamber and posterior capsule bag. The Monarch injector was used to place a folded Acrysof MA50BM PC IOL, + 20.00  diopters, into the capsule bag. A Sinskey lens hook was used to dial in the trailing haptic. The IOL was placed horizontally with it's haptics to avoid the region of the radial tear.  The I/A cannula was used to remove the viscoelastic from the anterior chamber. BSS was used to bring IOP to the desired range and the wound was checked to insure it was watertight.  Subconjunctival injections of Ance 100/0.67ml and Dexamethasone 4mg /73ml were placed without complication. The lid speculum and drapes were removed and the patient's eye was patched with Polymixin/Bacitracin ophthalmic ointment. An eye shield was placed and the patient was extubated uneventfully and transferred to the recovery area with stable vital signs.  [name], MD

## 2012-06-12 NOTE — Progress Notes (Signed)
Notified dr crews of ekg, will review in or.

## 2012-06-12 NOTE — Transfer of Care (Signed)
Immediate Anesthesia Transfer of Care Note  Patient: [name]  Procedure(s) Performed: Procedure(s) (LRB) with comments: CATARACT EXTRACTION PHACO AND INTRAOCULAR LENS PLACEMENT (IOC) (Right)  Patient Location: PACU  Anesthesia Type: General  Level of Consciousness: awake, alert  and oriented  Airway & Oxygen Therapy: Patient Spontanous Breathing and Patient connected to nasal cannula oxygen  Post-op Assessment: Report given to PACU RN  Post vital signs: Reviewed and stable  Complications: No apparent anesthesia complications

## 2012-06-12 NOTE — Anesthesia Postprocedure Evaluation (Signed)
  Anesthesia Post-op Note  Patient: [name]  Procedure(s) Performed: Procedure(s) (LRB) with comments: CATARACT EXTRACTION PHACO AND INTRAOCULAR LENS PLACEMENT (IOC) (Right)  Patient Location: PACU  Anesthesia Type: General  Level of Consciousness: awake and alert   Airway and Oxygen Therapy: Patient Spontanous Breathing  Post-op Pain: mild  Post-op Assessment: Post-op Vital signs reviewed  Post-op Vital Signs: Reviewed  Complications: No apparent anesthesia complications

## 2012-06-12 NOTE — Interval H&P Note (Signed)
History and Physical Interval Note:  06/12/2012 8:17 AM  [name]  has presented today for surgery, with the diagnosis of Combined Cataract Right Eye  The various methods of treatment have been discussed with the patient and family. After consideration of risks, benefits and other options for treatment, the patient has consented to  Procedure(s) (LRB) with comments: CATARACT EXTRACTION PHACO AND INTRAOCULAR LENS PLACEMENT (IOC) (Right) as a surgical intervention .  The patient's history has been reviewed, patient examined, no change in status, stable for surgery.  I have reviewed the patient's chart and labs.  Questions were answered to the patient's satisfaction.     Elah Avellino,MD

## 2012-06-15 ENCOUNTER — Encounter (HOSPITAL_COMMUNITY): Payer: Self-pay | Admitting: Anesthesiology

## 2012-06-15 ENCOUNTER — Ambulatory Visit (HOSPITAL_COMMUNITY)
Admission: RE | Admit: 2012-06-15 | Discharge: 2012-06-15 | Disposition: A | Discharge status: Home / Self Care | Payer: Medicaid Other | Source: Ambulatory Visit | Attending: Ophthalmology | Admitting: Ophthalmology

## 2012-06-15 ENCOUNTER — Encounter (HOSPITAL_COMMUNITY)
Admission: RE | Disposition: A | Discharge status: Home / Self Care | Payer: Self-pay | Source: Ambulatory Visit | Attending: Ophthalmology

## 2012-06-15 ENCOUNTER — Encounter (HOSPITAL_COMMUNITY): Payer: Self-pay | Admitting: *Deleted

## 2012-06-15 ENCOUNTER — Ambulatory Visit (HOSPITAL_COMMUNITY): Payer: Medicaid Other | Admitting: Anesthesiology

## 2012-06-15 DIAGNOSIS — H269 Unspecified cataract: Secondary | ICD-10-CM | POA: Insufficient documentation

## 2012-06-15 DIAGNOSIS — E119 Type 2 diabetes mellitus without complications: Secondary | ICD-10-CM | POA: Insufficient documentation

## 2012-06-15 DIAGNOSIS — Z794 Long term (current) use of insulin: Secondary | ICD-10-CM | POA: Insufficient documentation

## 2012-06-15 HISTORY — PX: CATARACT EXTRACTION W/PHACO: SHX586

## 2012-06-15 LAB — GLUCOSE, CAPILLARY

## 2012-06-15 SURGERY — PHACOEMULSIFICATION, CATARACT, WITH IOL INSERTION
Anesthesia: General | Site: Eye | Laterality: Left | Wound class: Clean

## 2012-06-15 MED ORDER — DEXAMETHASONE SODIUM PHOSPHATE 10 MG/ML IJ SOLN
INTRAMUSCULAR | Status: DC | PRN
  Administered 2012-06-15: 10 mg

## 2012-06-15 MED ORDER — OXYCODONE HCL 5 MG PO TABS
ORAL_TABLET | ORAL | Status: AC
  Filled 2012-06-15: qty 1

## 2012-06-15 MED ORDER — PROPOFOL 10 MG/ML IV BOLUS
INTRAVENOUS | Status: DC | PRN
  Administered 2012-06-15: 150 mg via INTRAVENOUS

## 2012-06-15 MED ORDER — BUPIVACAINE HCL 0.75 % IJ SOLN
INTRAMUSCULAR | Status: DC | PRN
  Administered 2012-06-15: 10 mL

## 2012-06-15 MED ORDER — HYDROMORPHONE HCL PF 1 MG/ML IJ SOLN
INTRAMUSCULAR | Status: AC
  Filled 2012-06-15: qty 1

## 2012-06-15 MED ORDER — GATIFLOXACIN 0.5 % OP SOLN
1.0000 [drp] | OPHTHALMIC | Status: AC | PRN
  Administered 2012-06-15 (×3): 1 [drp] via OPHTHALMIC
  Filled 2012-06-15: qty 2.5

## 2012-06-15 MED ORDER — FENTANYL CITRATE 0.05 MG/ML IJ SOLN
INTRAMUSCULAR | Status: DC | PRN
  Administered 2012-06-15: 100 ug via INTRAVENOUS

## 2012-06-15 MED ORDER — EPINEPHRINE HCL 1 MG/ML IJ SOLN
INTRAMUSCULAR | Status: AC
  Filled 2012-06-15: qty 1

## 2012-06-15 MED ORDER — PROVISC 10 MG/ML IO SOLN
INTRAOCULAR | Status: DC | PRN
  Administered 2012-06-15: .85 mL via INTRAOCULAR

## 2012-06-15 MED ORDER — PHENYLEPHRINE HCL 2.5 % OP SOLN
1.0000 [drp] | OPHTHALMIC | Status: AC | PRN
  Administered 2012-06-15 (×3): 1 [drp] via OPHTHALMIC
  Filled 2012-06-15: qty 3

## 2012-06-15 MED ORDER — SODIUM CHLORIDE 0.9 % IV SOLN
INTRAVENOUS | Status: DC | PRN
  Administered 2012-06-15: 12:00:00 via INTRAVENOUS

## 2012-06-15 MED ORDER — CEFAZOLIN SUBCONJUNCTIVAL INJECTION 100 MG/0.5 ML
200.0000 mg | INJECTION | SUBCONJUNCTIVAL | Status: AC
  Administered 2012-06-15: 100 mg via SUBCONJUNCTIVAL
  Filled 2012-06-15: qty 1

## 2012-06-15 MED ORDER — HYPROMELLOSE (GONIOSCOPIC) 2.5 % OP SOLN
OPHTHALMIC | Status: DC | PRN
  Administered 2012-06-15: 1 [drp] via OPHTHALMIC

## 2012-06-15 MED ORDER — ACETAZOLAMIDE SODIUM 500 MG IJ SOLR
INTRAMUSCULAR | Status: AC
  Filled 2012-06-15: qty 500

## 2012-06-15 MED ORDER — SODIUM HYALURONATE 10 MG/ML IO SOLN
INTRAOCULAR | Status: AC
  Filled 2012-06-15: qty 0.85

## 2012-06-15 MED ORDER — NA CHONDROIT SULF-NA HYALURON 40-30 MG/ML IO SOLN
INTRAOCULAR | Status: AC
  Filled 2012-06-15: qty 0.5

## 2012-06-15 MED ORDER — TETRACAINE HCL 0.5 % OP SOLN
OPHTHALMIC | Status: AC
  Filled 2012-06-15: qty 2

## 2012-06-15 MED ORDER — BSS IO SOLN
INTRAOCULAR | Status: AC
  Filled 2012-06-15: qty 500

## 2012-06-15 MED ORDER — EPINEPHRINE HCL 1 MG/ML IJ SOLN
INTRAOCULAR | Status: DC | PRN
  Administered 2012-06-15: 12:00:00

## 2012-06-15 MED ORDER — BACITRACIN-POLYMYXIN B 500-10000 UNIT/GM OP OINT
TOPICAL_OINTMENT | OPHTHALMIC | Status: AC
  Filled 2012-06-15: qty 3.5

## 2012-06-15 MED ORDER — NEOSTIGMINE METHYLSULFATE 1 MG/ML IJ SOLN
INTRAMUSCULAR | Status: DC | PRN
  Administered 2012-06-15: 5 mg via INTRAVENOUS

## 2012-06-15 MED ORDER — SODIUM CHLORIDE 0.9 % IV SOLN
INTRAVENOUS | Status: DC
  Administered 2012-06-15: 11:00:00 via INTRAVENOUS

## 2012-06-15 MED ORDER — ONDANSETRON HCL 4 MG/2ML IJ SOLN: 4.0000 mg | Freq: Once | INTRAMUSCULAR | Status: DC | PRN

## 2012-06-15 MED ORDER — NA CHONDROIT SULF-NA HYALURON 40-30 MG/ML IO SOLN
INTRAOCULAR | Status: DC | PRN
  Administered 2012-06-15: 0.5 mL via INTRAOCULAR

## 2012-06-15 MED ORDER — ONDANSETRON HCL 4 MG/2ML IJ SOLN
INTRAMUSCULAR | Status: DC | PRN
  Administered 2012-06-15: 4 mg via INTRAVENOUS

## 2012-06-15 MED ORDER — ROCURONIUM BROMIDE 100 MG/10ML IV SOLN
INTRAVENOUS | Status: DC | PRN
  Administered 2012-06-15: 50 mg via INTRAVENOUS

## 2012-06-15 MED ORDER — TETRACAINE HCL 0.5 % OP SOLN
1.0000 [drp] | OPHTHALMIC | Status: AC
  Administered 2012-06-15: 2 [drp] via OPHTHALMIC
  Filled 2012-06-15: qty 2

## 2012-06-15 MED ORDER — TRIAMCINOLONE ACETONIDE 40 MG/ML IJ SUSP
INTRAMUSCULAR | Status: AC
  Filled 2012-06-15: qty 1

## 2012-06-15 MED ORDER — LACTATED RINGERS IV SOLN
INTRAVENOUS | Status: DC | PRN
  Administered 2012-06-15: 12:00:00 via INTRAVENOUS

## 2012-06-15 MED ORDER — LIDOCAINE HCL 2 % IJ SOLN
INTRAMUSCULAR | Status: AC
  Filled 2012-06-15: qty 20

## 2012-06-15 MED ORDER — BACITRACIN-POLYMYXIN B 500-10000 UNIT/GM OP OINT
TOPICAL_OINTMENT | OPHTHALMIC | Status: DC | PRN
  Administered 2012-06-15: 1 via OPHTHALMIC

## 2012-06-15 MED ORDER — HYDROMORPHONE HCL PF 1 MG/ML IJ SOLN
0.2500 mg | INTRAMUSCULAR | Status: DC | PRN
  Administered 2012-06-15 (×2): 0.5 mg via INTRAVENOUS

## 2012-06-15 MED ORDER — LIDOCAINE HCL (CARDIAC) 20 MG/ML IV SOLN
INTRAVENOUS | Status: DC | PRN
  Administered 2012-06-15: 75 mg via INTRAVENOUS

## 2012-06-15 MED ORDER — BALANCED SALT IO SOLN
INTRAOCULAR | Status: DC | PRN
  Administered 2012-06-15: 15 mL via INTRAOCULAR

## 2012-06-15 MED ORDER — BUPIVACAINE HCL 0.75 % IJ SOLN
INTRAMUSCULAR | Status: AC
  Filled 2012-06-15: qty 10

## 2012-06-15 MED ORDER — OXYCODONE HCL 5 MG PO TABS
5.0000 mg | ORAL_TABLET | Freq: Once | ORAL | Status: AC | PRN
  Administered 2012-06-15: 5 mg via ORAL

## 2012-06-15 MED ORDER — PREDNISOLONE ACETATE 1 % OP SUSP
1.0000 [drp] | OPHTHALMIC | Status: AC
  Administered 2012-06-15: 1 [drp] via OPHTHALMIC
  Filled 2012-06-15: qty 5

## 2012-06-15 MED ORDER — DEXAMETHASONE SODIUM PHOSPHATE 10 MG/ML IJ SOLN
INTRAMUSCULAR | Status: AC
  Filled 2012-06-15: qty 1

## 2012-06-15 MED ORDER — OXYCODONE HCL 5 MG/5ML PO SOLN: 5.0000 mg | Freq: Once | ORAL | Status: AC | PRN

## 2012-06-15 MED ORDER — GLYCOPYRROLATE 0.2 MG/ML IJ SOLN
INTRAMUSCULAR | Status: DC | PRN
  Administered 2012-06-15: .8 mg via INTRAVENOUS

## 2012-06-15 MED ORDER — HYPROMELLOSE (GONIOSCOPIC) 2.5 % OP SOLN
OPHTHALMIC | Status: AC
  Filled 2012-06-15: qty 15

## 2012-06-15 SURGICAL SUPPLY — 64 items
APL SRG 3 HI ABS STRL LF PLS (MISCELLANEOUS) ×1
APPLICATOR COTTON TIP 6IN STRL (MISCELLANEOUS) ×2 IMPLANT
APPLICATOR DR MATTHEWS STRL (MISCELLANEOUS) ×2 IMPLANT
BAG FLD CLT MN 6.25X3.5 (WOUND CARE) ×1
BAG MINI COLL DRAIN (WOUND CARE) ×2 IMPLANT
BLADE EYE MINI 60D BEAVER (BLADE) IMPLANT
BLADE KERATOME 2.75 (BLADE) ×2 IMPLANT
BLADE STAB KNIFE 15DEG (BLADE) IMPLANT
CANNULA ANTERIOR CHAMBER 27GA (MISCELLANEOUS) ×1 IMPLANT
CLOTH BEACON ORANGE TIMEOUT ST (SAFETY) ×2 IMPLANT
DRAPE OPHTHALMIC 77X100 STRL (CUSTOM PROCEDURE TRAY) ×2 IMPLANT
DRAPE POUCH INSTRU U-SHP 10X18 (DRAPES) ×2 IMPLANT
DRSG TEGADERM 4X4.75 (GAUZE/BANDAGES/DRESSINGS) ×2 IMPLANT
FILTER BLUE MILLIPORE (MISCELLANEOUS) IMPLANT
GLOVE SS BIOGEL STRL SZ 6.5 (GLOVE) ×1 IMPLANT
GLOVE SUPERSENSE BIOGEL SZ 6.5 (GLOVE) ×2
GOWN SRG XL XLNG 56XLVL 4 (GOWN DISPOSABLE) ×1 IMPLANT
GOWN STRL NON-REIN LRG LVL3 (GOWN DISPOSABLE) ×3 IMPLANT
GOWN STRL NON-REIN XL XLG LVL4 (GOWN DISPOSABLE) ×2
KIT BASIN OR (CUSTOM PROCEDURE TRAY) ×2 IMPLANT
KIT ROOM TURNOVER OR (KITS) ×1 IMPLANT
KNIFE GRIESHABER SHARP 2.5MM (MISCELLANEOUS) ×2 IMPLANT
LENS IOL ACRYSOF MP POST 19.5 (Intraocular Lens) ×1 IMPLANT
MASK EYE SHIELD (GAUZE/BANDAGES/DRESSINGS) ×1 IMPLANT
NDL 18GX1X1/2 (RX/OR ONLY) (NEEDLE) IMPLANT
NDL 25GX 5/8IN NON SAFETY (NEEDLE) ×1 IMPLANT
NDL FILTER BLUNT 18X1 1/2 (NEEDLE) IMPLANT
NDL HYPO 30X.5 LL (NEEDLE) ×2 IMPLANT
NEEDLE 18GX1X1/2 (RX/OR ONLY) (NEEDLE) IMPLANT
NEEDLE 22X1 1/2 (OR ONLY) (NEEDLE) ×1 IMPLANT
NEEDLE 25GX 5/8IN NON SAFETY (NEEDLE) ×2 IMPLANT
NEEDLE FILTER BLUNT 18X 1/2SAF (NEEDLE) ×1
NEEDLE FILTER BLUNT 18X1 1/2 (NEEDLE) ×1 IMPLANT
NEEDLE HYPO 30X.5 LL (NEEDLE) ×4 IMPLANT
NS IRRIG 1000ML POUR BTL (IV SOLUTION) ×2 IMPLANT
PACK CATARACT CUSTOM (CUSTOM PROCEDURE TRAY) ×2 IMPLANT
PACK CATARACT MCHSCP (PACKS) ×2 IMPLANT
PACK COMBINED CATERACT/VIT 23G (OPHTHALMIC RELATED) IMPLANT
PAD ARMBOARD 7.5X6 YLW CONV (MISCELLANEOUS) ×4 IMPLANT
PAD EYE OVAL STERILE LF (GAUZE/BANDAGES/DRESSINGS) ×1 IMPLANT
PHACO TIP KELMAN 45DEG (TIP) ×2 IMPLANT
PROBE ANTERIOR 20G W/INFUS NDL (MISCELLANEOUS) IMPLANT
ROLLS DENTAL (MISCELLANEOUS) IMPLANT
SHUTTLE MONARCH TYPE A (NEEDLE) ×3 IMPLANT
SOLUTION ANTI FOG 6CC (MISCELLANEOUS) ×1 IMPLANT
SPEAR EYE SURG WECK-CEL (MISCELLANEOUS) ×1 IMPLANT
SPONGE GAUZE 4X4 12PLY (GAUZE/BANDAGES/DRESSINGS) ×1 IMPLANT
SUT ETHILON 10-0 CS-B-6CS-B-6 (SUTURE)
SUT ETHILON 5 0 P 3 18 (SUTURE)
SUT ETHILON 9 0 TG140 8 (SUTURE) IMPLANT
SUT NYLON ETHILON 5-0 P-3 1X18 (SUTURE) IMPLANT
SUT PLAIN 6 0 TG1408 (SUTURE) IMPLANT
SUT POLY NON ABSORB 10-0 8 STR (SUTURE) IMPLANT
SUT VICRYL 6 0 S 29 12 (SUTURE) IMPLANT
SUTURE EHLN 10-0 CS-B-6CS-B-6 (SUTURE) IMPLANT
SYR 20CC LL (SYRINGE) IMPLANT
SYR 5ML LL (SYRINGE) IMPLANT
SYR TB 1ML LUER SLIP (SYRINGE) ×1 IMPLANT
SYRINGE 10CC LL (SYRINGE) IMPLANT
TAPE SURG TRANSPORE 1 IN (GAUZE/BANDAGES/DRESSINGS) IMPLANT
TAPE SURGICAL TRANSPORE 1 IN (GAUZE/BANDAGES/DRESSINGS) ×1
TOWEL OR 17X24 6PK STRL BLUE (TOWEL DISPOSABLE) ×4 IMPLANT
WATER STERILE IRR 1000ML POUR (IV SOLUTION) ×2 IMPLANT
WIPE INSTRUMENT VISIWIPE 73X73 (MISCELLANEOUS) ×2 IMPLANT

## 2012-06-15 NOTE — Progress Notes (Signed)
Patient arrived in OR holding area without an IV in place.

## 2012-06-15 NOTE — Anesthesia Preprocedure Evaluation (Signed)
Anesthesia Evaluation  Patient identified by MRN, date of birth, ID band Patient awake    Reviewed: Allergy & Precautions, NPO status , Patient's Chart, lab work & pertinent test results  Airway Mallampati: I TM Distance: >3 FB Neck ROM: Full    Dental  (+) Teeth Intact and Dental Advisory Given   Pulmonary  breath sounds clear to auscultation        Cardiovascular Rhythm:Regular Rate:Normal     Neuro/Psych    GI/Hepatic   Endo/Other  diabetes, Well Controlled, Type 2, Insulin Dependent  Renal/GU      Musculoskeletal   Abdominal   Peds  Hematology   Anesthesia Other Findings   Reproductive/Obstetrics                           Anesthesia Physical Anesthesia Plan  ASA: III  Anesthesia Plan: General   Post-op Pain Management:    Induction: Intravenous  Airway Management Planned: Oral ETT  Additional Equipment:   Intra-op Plan:   Post-operative Plan: Extubation in OR  Informed Consent: I have reviewed the patients History and Physical, chart, labs and discussed the procedure including the risks, benefits and alternatives for the proposed anesthesia with the patient or authorized representative who has indicated his/her understanding and acceptance.   Dental advisory given  Plan Discussed with: CRNA, Anesthesiologist and Surgeon  Anesthesia Plan Comments:         Anesthesia Quick Evaluation

## 2012-06-15 NOTE — Progress Notes (Signed)
DISCHARGE INSTRUCTIONS GONE OVER WITH PATIENT AND HER FRIEND. (ANESTHESIA, EMRG #, INSTRUCTIONS FROM DR GEIGER.)

## 2012-06-15 NOTE — Preoperative (Signed)
Beta Blockers   Reason not to administer Beta Blockers:Not Applicable 

## 2012-06-15 NOTE — Anesthesia Postprocedure Evaluation (Signed)
  Anesthesia Post-op Note  Patient: [name]  Procedure(s) Performed: Procedure(s) (LRB) with comments: CATARACT EXTRACTION PHACO AND INTRAOCULAR LENS PLACEMENT (IOC) (Left)  Patient Location: PACU  Anesthesia Type: General  Level of Consciousness: awake and alert   Airway and Oxygen Therapy: Patient Spontanous Breathing and Patient connected to nasal cannula oxygen  Post-op Pain: none  Post-op Assessment: Post-op Vital signs reviewed  Post-op Vital Signs: Reviewed  Complications: No apparent anesthesia complications

## 2012-06-15 NOTE — H&P (View-Only) (Signed)
  Pre-operative History and Physical for Ophthalmic Surgery  [name] 06/08/2012                  Chief Complaint: Decreased vision  Diagnosis: Combined Cataract Right Eye  No known Allergies  Prior to Admission medications   Not on File    Planned Procedure:                                       Phacoemulsification, Posterior Chamber Intra-ocular Lens Right Eye                                      Acrysof [id number]  + 16.50 Diopter PC IOL for implant   There were no vitals filed for this visit.  Pulse: 78         Temp: NE        Resp:  16       ROS: negative  No past medical history on file.  No past surgical history on file.   History   Social History  . Marital Status: Single    Spouse Name: N/A    Number of Children: N/A  . Years of Education: N/A   Occupational History  . Not on file.   Social History Main Topics  . Smoking status: Not on file  . Smokeless tobacco: Not on file  . Alcohol Use: Not on file  . Drug Use: Not on file  . Sexually Active: Not on file   Other Topics Concern  . Not on file   Social History Narrative  . No narrative on file     The following examination is for anesthesia clearance for minimally invasive Ophthalmic surgery. It is primarily to document heart and lung findings and is not intended to elucidate unknown general medical conditions inclusive of abdominal masses, lung lesions, etc.   General Constitution:  within normal limits   Alertness/Orientation:  Person, time place     yes   HEENT:  Eye Findings: Combined Cataract                    right eye  Neck: supple without masses  Chest/Lungs: clear to auscultation  Cardiac: Normal S1 and S2 without Murmur, S3 or S4  Neuro: non-focal  Impression:  Combined Cataract   Planned Procedure:  Phacoemulsification, Posterior Chamber Intraocular Lens, Right Eye    Erika Whiters, MD        

## 2012-06-15 NOTE — Transfer of Care (Signed)
Immediate Anesthesia Transfer of Care Note  Patient: [name]  Procedure(s) Performed: Procedure(s) (LRB) with comments: CATARACT EXTRACTION PHACO AND INTRAOCULAR LENS PLACEMENT (IOC) (Left)  Patient Location: PACU  Anesthesia Type: General  Level of Consciousness: awake and alert   Airway & Oxygen Therapy: Patient Spontanous Breathing and Patient connected to nasal cannula oxygen  Post-op Assessment: Report given to PACU RN and Post -op Vital signs reviewed and stable  Post vital signs: Reviewed and stable  Complications: No apparent anesthesia complications

## 2012-06-15 NOTE — Anesthesia Procedure Notes (Signed)
Procedure Name: Intubation Date/Time: 06/15/2012 12:10 PM Performed by: Gwenyth Allegra Pre-anesthesia Checklist: Patient identified, Timeout performed, Emergency Drugs available, Suction available and Patient being monitored Patient Re-evaluated:Patient Re-evaluated prior to inductionOxygen Delivery Method: Circle system utilized Preoxygenation: Pre-oxygenation with 100% oxygen Intubation Type: IV induction Ventilation: Mask ventilation without difficulty and Oral airway inserted - appropriate to patient size Laryngoscope Size: Mac and 3 Grade View: Grade II Tube type: Oral Tube size: 7.0 mm Number of attempts: 1 Airway Equipment and Method: Stylet and Patient positioned with wedge pillow Placement Confirmation: ETT inserted through vocal cords under direct vision,  breath sounds checked- equal and bilateral and positive ETCO2 Secured at: 21 cm Tube secured with: Tape Dental Injury: Teeth and Oropharynx as per pre-operative assessment

## 2012-06-15 NOTE — Interval H&P Note (Signed)
History and Physical Interval Note:  06/15/2012 11:26 AM  [name]  has presented today for surgery, with the diagnosis of combined cataract   The various methods of treatment have been discussed with the patient and family. After consideration of risks, benefits and other options for treatment, the patient has consented to  Procedure(s) (LRB) with comments: CATARACT EXTRACTION PHACO AND INTRAOCULAR LENS PLACEMENT (IOC) (Left) as a surgical intervention .  The patient's history has been reviewed, patient examined, no change in status, stable for surgery.  I have reviewed the patient's chart and labs.  Questions were answered to the patient's satisfaction.     [name], MD

## 2012-06-15 NOTE — Op Note (Signed)
Tyler Robidoux 06/15/2012 Cataract: Combined  Procedure: Phacoemulsification, Posterior Chamber Intra-ocular Lens Operative Eye:  left eye  Surgeon: [name] Estimated Blood Loss: minimal Specimens for Pathology:  None Complications: none  The patient was prepared and draped in the usual manner for ocular surgery on the left eye. A Cook lid speculum was placed. A peripheral clear corneal incision was made at the surgical limbus centered at the 11:00 meridian. A separate clear corneal stab incision was made with a 15 degree blade at the 2:00 meridian to permit bi-manual technique. Provisc was instilled into the anterior chamber through that incision.  A keratome was used to create a self sealing incision entering the anterior chamber at the 11:00 meridian. A capsulorhexis was performed using a bent 25g needle. The lens was hydrodissected and the nucleus was hydrodilineated using a Nichammin cannula. The Chang chopper was inserted and used to rotate the lens to insure adequate lens mobility. The phacoemulsification handpiece was inserted and a combined phaco-chop technique was employed, fracturing the lens into separate sections with subsequent removal with the phaco handpiece.   The I/A cannula was used to remove remaining lens cortex. Provisc was instilled and used to deepen the anterior chamber and posterior capsule bag. The Monarch injector was used to place a folded Acrysof MA50BM PC IOL, + 19.50  diopters, into the capsule bag. A Sinskey lens hook was used to dial in the trailing haptic.  The I/A cannula was used to remove the viscoelastic from the anterior chamber. BSS was used to bring IOP to the desired range and the wound was checked to insure it was watertight. Subconjunctival injections of Ance 100/0.29ml and Dexamethasone 4mg /68ml were placed without complication. The lid speculum and drapes were removed and the patient's eye was patched with Polymixin/Bacitracin ophthalmic ointment.  An eye shield was placed and the patient was transferred alert and conversant from the operating room to the post-operative recovery area.   [name], MD

## 2012-06-16 ENCOUNTER — Encounter (HOSPITAL_COMMUNITY): Payer: Self-pay | Admitting: Ophthalmology

## 2012-07-11 ENCOUNTER — Encounter (HOSPITAL_COMMUNITY): Payer: Self-pay | Admitting: Emergency Medicine

## 2012-07-11 ENCOUNTER — Emergency Department (HOSPITAL_COMMUNITY)
Admission: EM | Admit: 2012-07-11 | Discharge: 2012-07-11 | Disposition: A | Discharge status: Home / Self Care | Payer: No Typology Code available for payment source | Attending: Emergency Medicine | Admitting: Emergency Medicine

## 2012-07-11 ENCOUNTER — Emergency Department (HOSPITAL_COMMUNITY): Payer: No Typology Code available for payment source

## 2012-07-11 DIAGNOSIS — Y92410 Unspecified street and highway as the place of occurrence of the external cause: Secondary | ICD-10-CM | POA: Insufficient documentation

## 2012-07-11 DIAGNOSIS — B009 Herpesviral infection, unspecified: Secondary | ICD-10-CM | POA: Insufficient documentation

## 2012-07-11 DIAGNOSIS — G8929 Other chronic pain: Secondary | ICD-10-CM | POA: Insufficient documentation

## 2012-07-11 DIAGNOSIS — Z87440 Personal history of urinary (tract) infections: Secondary | ICD-10-CM | POA: Insufficient documentation

## 2012-07-11 DIAGNOSIS — Z794 Long term (current) use of insulin: Secondary | ICD-10-CM | POA: Insufficient documentation

## 2012-07-11 DIAGNOSIS — Z8739 Personal history of other diseases of the musculoskeletal system and connective tissue: Secondary | ICD-10-CM | POA: Insufficient documentation

## 2012-07-11 DIAGNOSIS — M549 Dorsalgia, unspecified: Secondary | ICD-10-CM

## 2012-07-11 DIAGNOSIS — Z79899 Other long term (current) drug therapy: Secondary | ICD-10-CM | POA: Insufficient documentation

## 2012-07-11 DIAGNOSIS — Z87312 Personal history of (healed) stress fracture: Secondary | ICD-10-CM | POA: Insufficient documentation

## 2012-07-11 DIAGNOSIS — E1149 Type 2 diabetes mellitus with other diabetic neurological complication: Secondary | ICD-10-CM | POA: Insufficient documentation

## 2012-07-11 DIAGNOSIS — Y9389 Activity, other specified: Secondary | ICD-10-CM | POA: Insufficient documentation

## 2012-07-11 DIAGNOSIS — M25512 Pain in left shoulder: Secondary | ICD-10-CM

## 2012-07-11 DIAGNOSIS — R0789 Other chest pain: Secondary | ICD-10-CM

## 2012-07-11 DIAGNOSIS — IMO0002 Reserved for concepts with insufficient information to code with codable children: Secondary | ICD-10-CM | POA: Insufficient documentation

## 2012-07-11 DIAGNOSIS — S46909A Unspecified injury of unspecified muscle, fascia and tendon at shoulder and upper arm level, unspecified arm, initial encounter: Secondary | ICD-10-CM | POA: Insufficient documentation

## 2012-07-11 DIAGNOSIS — E1142 Type 2 diabetes mellitus with diabetic polyneuropathy: Secondary | ICD-10-CM | POA: Insufficient documentation

## 2012-07-11 DIAGNOSIS — S298XXA Other specified injuries of thorax, initial encounter: Secondary | ICD-10-CM | POA: Insufficient documentation

## 2012-07-11 DIAGNOSIS — S4980XA Other specified injuries of shoulder and upper arm, unspecified arm, initial encounter: Secondary | ICD-10-CM | POA: Insufficient documentation

## 2012-07-11 MED ORDER — DIAZEPAM 5 MG PO TABS: 5.0000 mg | ORAL_TABLET | Freq: Three times a day (TID) | ORAL | Status: DC | PRN

## 2012-07-11 MED ORDER — IBUPROFEN 800 MG PO TABS
800.0000 mg | ORAL_TABLET | Freq: Once | ORAL | Status: AC
  Administered 2012-07-11: 800 mg via ORAL
  Filled 2012-07-11: qty 1

## 2012-07-11 MED ORDER — IBUPROFEN 800 MG PO TABS: 800.0000 mg | ORAL_TABLET | Freq: Three times a day (TID) | ORAL | Status: DC | PRN
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
## 2012-07-11 NOTE — ED Provider Notes (Signed)
Medical screening examination/treatment/procedure(s) were performed by non-physician practitioner and as supervising physician I was immediately available for consultation/collaboration.  Doug Sou, MD 07/11/12 1626
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
## 2012-10-20 ENCOUNTER — Encounter (HOSPITAL_COMMUNITY): Payer: Self-pay | Admitting: Emergency Medicine

## 2012-10-20 ENCOUNTER — Emergency Department (HOSPITAL_COMMUNITY): Payer: Medicaid Other

## 2012-10-20 ENCOUNTER — Emergency Department (HOSPITAL_COMMUNITY)
Admission: EM | Admit: 2012-10-20 | Discharge: 2012-10-20 | Disposition: A | Discharge status: Home / Self Care | Payer: Medicaid Other | Attending: Emergency Medicine | Admitting: Emergency Medicine

## 2012-10-20 DIAGNOSIS — Z3202 Encounter for pregnancy test, result negative: Secondary | ICD-10-CM | POA: Insufficient documentation

## 2012-10-20 DIAGNOSIS — M129 Arthropathy, unspecified: Secondary | ICD-10-CM | POA: Insufficient documentation

## 2012-10-20 DIAGNOSIS — Z8669 Personal history of other diseases of the nervous system and sense organs: Secondary | ICD-10-CM | POA: Insufficient documentation

## 2012-10-20 DIAGNOSIS — Z794 Long term (current) use of insulin: Secondary | ICD-10-CM | POA: Insufficient documentation

## 2012-10-20 DIAGNOSIS — R51 Headache: Secondary | ICD-10-CM

## 2012-10-20 DIAGNOSIS — Z8619 Personal history of other infectious and parasitic diseases: Secondary | ICD-10-CM | POA: Insufficient documentation

## 2012-10-20 DIAGNOSIS — Z79899 Other long term (current) drug therapy: Secondary | ICD-10-CM | POA: Insufficient documentation

## 2012-10-20 DIAGNOSIS — G8929 Other chronic pain: Secondary | ICD-10-CM | POA: Insufficient documentation

## 2012-10-20 DIAGNOSIS — E1142 Type 2 diabetes mellitus with diabetic polyneuropathy: Secondary | ICD-10-CM | POA: Insufficient documentation

## 2012-10-20 DIAGNOSIS — IMO0002 Reserved for concepts with insufficient information to code with codable children: Secondary | ICD-10-CM | POA: Insufficient documentation

## 2012-10-20 DIAGNOSIS — E1149 Type 2 diabetes mellitus with other diabetic neurological complication: Secondary | ICD-10-CM | POA: Insufficient documentation

## 2012-10-20 DIAGNOSIS — Z87440 Personal history of urinary (tract) infections: Secondary | ICD-10-CM | POA: Insufficient documentation

## 2012-10-20 DIAGNOSIS — Z8781 Personal history of (healed) traumatic fracture: Secondary | ICD-10-CM | POA: Insufficient documentation

## 2012-10-20 LAB — POCT I-STAT, CHEM 8
BUN: 6 mg/dL (ref 6–23)
Chloride: 100 mEq/L (ref 96–112)
Potassium: 3.9 mEq/L (ref 3.5–5.1)
Sodium: 139 mEq/L (ref 135–145)

## 2012-10-20 LAB — POCT PREGNANCY, URINE: Preg Test, Ur: NEGATIVE

## 2012-10-20 MED ORDER — SODIUM CHLORIDE 0.9 % IV SOLN: INTRAVENOUS | Status: DC

## 2012-10-20 MED ORDER — MORPHINE SULFATE 4 MG/ML IJ SOLN
4.0000 mg | Freq: Once | INTRAMUSCULAR | Status: DC
  Filled 2012-10-20: qty 1

## 2012-10-20 MED ORDER — LORAZEPAM 2 MG/ML IJ SOLN
1.0000 mg | Freq: Once | INTRAMUSCULAR | Status: DC
  Filled 2012-10-20: qty 1

## 2012-10-20 MED ORDER — KETOROLAC TROMETHAMINE 30 MG/ML IJ SOLN
30.0000 mg | Freq: Once | INTRAMUSCULAR | Status: AC
  Administered 2012-10-20: 30 mg via INTRAVENOUS
  Filled 2012-10-20: qty 1

## 2012-10-20 NOTE — ED Provider Notes (Signed)
History     CSN: [id number]  Arrival date & time 10/20/12  1743   First MD Initiated Contact with Patient 10/20/12 1807      Chief Complaint  Patient presents with  . Headache  . Hypertension    (Consider location/radiation/quality/duration/timing/severity/associated sxs/prior treatment) Patient is a 37 y.o. female presenting with headaches and hypertension. The history is provided by the patient.  Headache   Hypertension Associated symptoms include headaches.   patient here with headache that started earlier today. Pain is localized in her frontal portion of her head without slurred speech or photophobia or fever. Headache has been persistent and nothing makes it better or worse. Just feels slightly anxious. EMS was called and she was noted to be hypertensive. Blood pressure gradually resolve on its own.  Past Medical History  Diagnosis Date  . Diabetes mellitus   . Urinary tract infection     hx of  . Diabetic neuropathy   . Arthritis   . Herpes virus disease   . Chronic back pain   . Hip fracture     left side  . Cataracts, bilateral     Past Surgical History  Procedure Date  . Fracture surgery     surgery left hip  . Cesarean section     x 2  . Cataract extraction     left  . Cataract extraction w/phaco 06/12/2012    Procedure: CATARACT EXTRACTION PHACO AND INTRAOCULAR LENS PLACEMENT (IOC);  Surgeon: [name], MD;  Location: [facility] OR;  Service: Ophthalmology;  Laterality: Right;  . Cataract extraction w/phaco 06/15/2012    Procedure: CATARACT EXTRACTION PHACO AND INTRAOCULAR LENS PLACEMENT (IOC);  Surgeon: [name], MD;  Location: [facility] OR;  Service: Ophthalmology;  Laterality: Left;    No family history on file.  History  Substance Use Topics  . Smoking status: Never Smoker   . Smokeless tobacco: Not on file  . Alcohol Use: No    OB History    Grav Para Term Preterm Abortions TAB SAB Ect Mult Living                  Review of Systems   Neurological: Positive for headaches.  All other systems reviewed and are negative.    Allergies  Phenergan  Home Medications   Current Outpatient Rx  Name  Route  Sig  Dispense  Refill  . BRIMONIDINE TARTRATE-TIMOLOL 0.2-0.5 % OP SOLN   Right Eye   Place 1 drop into the right eye daily.         Marland Kitchen DIAZEPAM 5 MG PO TABS   Oral   Take 1 tablet (5 mg total) by mouth every 8 (eight) hours as needed (muscle spasm, pain).   12 tablet   0   . GATIFLOXACIN 0.5 % OP SOLN   Right Eye   Place 1 drop into the right eye 4 (four) times daily.         . IBUPROFEN 800 MG PO TABS   Oral   Take 1 tablet (800 mg total) by mouth every 8 (eight) hours as needed for pain.   21 tablet   0   . INSULIN ISOPHANE & REGULAR (70-30) 100 UNIT/ML [place] SUSP   Subcutaneous   Inject 30-45 Units into the skin 2 (two) times daily with a meal. 45 units in am,  30 units pm         . PREDNISOLONE ACETATE OP   Right Eye   Place 1 drop into  the right eye 4 (four) times daily.         Marland Kitchen SIMVASTATIN 10 MG PO TABS   Oral   Take 10 mg by mouth at bedtime.         . TRAMADOL HCL 50 MG PO TABS   Oral   Take 50 mg by mouth every 8 (eight) hours as needed. For pain         . VALACYCLOVIR HCL 500 MG PO TABS   Oral   Take 500 mg by mouth daily.           BP 132/82  Temp 98.1 F (36.7 C) (Oral)  Resp 10  SpO2 99%  LMP 10/05/2012  Physical Exam  Nursing note and vitals reviewed. Constitutional: She is oriented to person, place, and time. She appears well-developed and well-nourished.  Non-toxic appearance. No distress.  HENT:  Head: Normocephalic and atraumatic.  Eyes: Conjunctivae normal, EOM and lids are normal. Pupils are equal, round, and reactive to light.  Neck: Normal range of motion. Neck supple. No tracheal deviation present. No mass present.  Cardiovascular: Normal rate, regular rhythm and normal heart sounds.  Exam reveals no gallop.   No murmur heard. Pulmonary/Chest:  Effort normal and breath sounds normal. No stridor. No respiratory distress. She has no decreased breath sounds. She has no wheezes. She has no rhonchi. She has no rales.  Abdominal: Soft. Normal appearance and bowel sounds are normal. She exhibits no distension. There is no tenderness. There is no rebound and no CVA tenderness.  Musculoskeletal: Normal range of motion. She exhibits no edema and no tenderness.  Neurological: She is alert and oriented to person, place, and time. She has normal strength. No cranial nerve deficit or sensory deficit. GCS eye subscore is 4. GCS verbal subscore is 5. GCS motor subscore is 6.  Skin: Skin is warm and dry. No abrasion and no rash noted.  Psychiatric: She has a normal mood and affect. Her speech is normal and behavior is normal.    ED Course  Procedures (including critical care time)  Labs Reviewed - No data to display No results found.   No diagnosis found.    MDM  Pt given pain meds and feels better--repeat neuro exam stable, no concern for sah or meningitis        Toy Baker, MD 10/20/12 2012

## 2012-10-20 NOTE — ED Notes (Signed)
Per EMS: Pt has a severe headache and felt as if she was going to faint. Hypertensive (200/100) for EMS with no hx. SBP dropped to 140s en route. Pt is also very anxious.

## 2012-10-20 NOTE — ED Notes (Signed)
Pt states the sensation on her left side is different than right side - L is numb/tingling in face, arm, and leg.

## 2012-11-14 DIAGNOSIS — L0291 Cutaneous abscess, unspecified: Secondary | ICD-10-CM

## 2012-11-14 HISTORY — DX: Cutaneous abscess, unspecified: L02.91

## 2012-11-14 HISTORY — PX: HAND SURGERY: SHX662

## 2012-11-19 ENCOUNTER — Encounter (HOSPITAL_COMMUNITY): Payer: Self-pay | Admitting: Emergency Medicine

## 2012-11-19 ENCOUNTER — Emergency Department (HOSPITAL_COMMUNITY)
Admission: EM | Admit: 2012-11-19 | Discharge: 2012-11-19 | Disposition: A | Discharge status: Home / Self Care | Payer: Medicaid Other | Attending: Emergency Medicine | Admitting: Emergency Medicine

## 2012-11-19 DIAGNOSIS — Z8739 Personal history of other diseases of the musculoskeletal system and connective tissue: Secondary | ICD-10-CM | POA: Insufficient documentation

## 2012-11-19 DIAGNOSIS — L03019 Cellulitis of unspecified finger: Secondary | ICD-10-CM | POA: Insufficient documentation

## 2012-11-19 DIAGNOSIS — Z87440 Personal history of urinary (tract) infections: Secondary | ICD-10-CM | POA: Insufficient documentation

## 2012-11-19 DIAGNOSIS — Z8619 Personal history of other infectious and parasitic diseases: Secondary | ICD-10-CM | POA: Insufficient documentation

## 2012-11-19 DIAGNOSIS — Z8781 Personal history of (healed) traumatic fracture: Secondary | ICD-10-CM | POA: Insufficient documentation

## 2012-11-19 DIAGNOSIS — Z794 Long term (current) use of insulin: Secondary | ICD-10-CM | POA: Insufficient documentation

## 2012-11-19 DIAGNOSIS — E1149 Type 2 diabetes mellitus with other diabetic neurological complication: Secondary | ICD-10-CM | POA: Insufficient documentation

## 2012-11-19 DIAGNOSIS — L03011 Cellulitis of right finger: Secondary | ICD-10-CM

## 2012-11-19 DIAGNOSIS — E1142 Type 2 diabetes mellitus with diabetic polyneuropathy: Secondary | ICD-10-CM | POA: Insufficient documentation

## 2012-11-19 DIAGNOSIS — Z8669 Personal history of other diseases of the nervous system and sense organs: Secondary | ICD-10-CM | POA: Insufficient documentation

## 2012-11-19 DIAGNOSIS — Z79899 Other long term (current) drug therapy: Secondary | ICD-10-CM | POA: Insufficient documentation

## 2012-11-19 LAB — GLUCOSE, CAPILLARY: Glucose-Capillary: 266 mg/dL — ABNORMAL HIGH (ref 70–99)

## 2012-11-19 MED ORDER — TRAMADOL HCL 50 MG PO TABS: 50.0000 mg | ORAL_TABLET | Freq: Three times a day (TID) | ORAL | Status: DC | PRN

## 2012-11-19 MED ORDER — CEPHALEXIN 250 MG PO CAPS: 250.0000 mg | ORAL_CAPSULE | Freq: Three times a day (TID) | ORAL | Status: DC

## 2012-11-19 MED ORDER — BUPIVACAINE HCL (PF) 0.5 % IJ SOLN
INTRAMUSCULAR | Status: AC
  Administered 2012-11-19: 5 mg
  Filled 2012-11-19: qty 30

## 2012-11-19 MED ORDER — CEPHALEXIN 250 MG PO CAPS
250.0000 mg | ORAL_CAPSULE | Freq: Once | ORAL | Status: AC
  Administered 2012-11-19: 250 mg via ORAL
  Filled 2012-11-19: qty 1

## 2012-11-19 NOTE — ED Notes (Signed)
CBG 266, glucometer not recognizing pt ID. Confirmed at bedside.

## 2012-11-19 NOTE — ED Notes (Signed)
Pt c/o posterior right middle finger swelling that started on Monday. No trauma noted. Pt had her nails done Saturday, had soreness Sunday, painful Monday, and swelling Tuesday. Hx DM. Allergic to Phenergan.

## 2012-11-19 NOTE — ED Provider Notes (Signed)
History     CSN: [id number]  Arrival date & time 11/19/12  0011   First MD Initiated Contact with Patient 11/19/12 [phone]      Chief Complaint  Patient presents with  . Hand Pain    (Consider location/radiation/quality/duration/timing/severity/associated sxs/prior treatment) HPI Comments: Patient had manacure on Saturday on Sunday noted pain and swelling on finger that has progressed NO drainage noted but red swollen areas around cuticle  Patient is a 37 y.o. female presenting with hand pain. The history is provided by the patient.  Hand Pain This is a new problem. The current episode started in the past 7 days. The problem occurs constantly. Pertinent negatives include no fever or numbness. She has tried nothing for the symptoms.    Past Medical History  Diagnosis Date  . Diabetes mellitus   . Urinary tract infection     hx of  . Diabetic neuropathy   . Arthritis   . Herpes virus disease   . Chronic back pain   . Hip fracture     left side  . Cataracts, bilateral     Past Surgical History  Procedure Laterality Date  . Fracture surgery      surgery left hip  . Cesarean section      x 2  . Cataract extraction      left  . Cataract extraction w/phaco  06/12/2012    Procedure: CATARACT EXTRACTION PHACO AND INTRAOCULAR LENS PLACEMENT (IOC);  Surgeon: [name], MD;  Location: [facility] OR;  Service: Ophthalmology;  Laterality: Right;  . Cataract extraction w/phaco  06/15/2012    Procedure: CATARACT EXTRACTION PHACO AND INTRAOCULAR LENS PLACEMENT (IOC);  Surgeon: [name], MD;  Location: [facility] OR;  Service: Ophthalmology;  Laterality: Left;    No family history on file.  History  Substance Use Topics  . Smoking status: Never Smoker   . Smokeless tobacco: Not on file  . Alcohol Use: No    OB History   Grav Para Term Preterm Abortions TAB SAB Ect Mult Living                  Review of Systems  Constitutional: Negative for fever.  Skin: Positive for wound.   Neurological: Negative for numbness.  All other systems reviewed and are negative.    Allergies  Phenergan  Home Medications   Current Outpatient Rx  Name  Route  Sig  Dispense  Refill  . insulin NPH-insulin regular (NOVOLIN 70/30) (70-30) 100 UNIT/ML injection   Subcutaneous   Inject 30-45 Units into the skin 2 (two) times daily with a meal. 45 units in am,  30 units pm         . simvastatin (ZOCOR) 10 MG tablet   Oral   Take 10 mg by mouth at bedtime.         . cephALEXin (KEFLEX) 250 MG capsule   Oral   Take 1 capsule (250 mg total) by mouth 3 (three) times daily.   30 capsule   0   . traMADol (ULTRAM) 50 MG tablet   Oral   Take 1 tablet (50 mg total) by mouth every 8 (eight) hours as needed. For pain   30 tablet   0     BP 130/71  Pulse 85  Temp(Src) 97.5 F (36.4 C) (Oral)  SpO2 99%  LMP 11/14/2012  Physical Exam  Constitutional: She is oriented to person, place, and time. She appears well-developed and well-nourished.  HENT:  Head: Normocephalic.  Eyes: Pupils are equal, round, and reactive to light.  Neck: Normal range of motion.  Cardiovascular: Normal rate.   Pulmonary/Chest: Effort normal.  Musculoskeletal: Normal range of motion.  Neurological: She is alert and oriented to person, place, and time.  Skin: Skin is warm. There is erythema.    ED Course  Drain paronychia Date/Time: 11/19/2012 1:35 AM Performed by: Arman Filter Authorized by: Arman Filter Consent: Verbal consent obtained. Risks and benefits: risks, benefits and alternatives were discussed Consent given by: patient Patient understanding: patient states understanding of the procedure being performed Required items: required blood products, implants, devices, and special equipment available Patient identity confirmed: verbally with patient Time out: Immediately prior to procedure a "time out" was called to verify the correct patient, procedure, equipment, support staff and  site/side marked as required. Preparation: Patient was prepped and draped in the usual sterile fashion. Local anesthesia used: yes Anesthesia: digital block Local anesthetic: lidocaine 1% without epinephrine and bupivacaine 0.5% without epinephrine Anesthetic total: 4 ml Patient sedated: no Patient tolerance: Patient tolerated the procedure well with no immediate complications.   (including critical care time)  Labs Reviewed  GLUCOSE, CAPILLARY - Abnormal; Notable for the following:    Glucose-Capillary 266 (*)    All other components within normal limits   No results found.   1. Paronychia of finger, right       MDM  Drained for small amount of purulent material  Finger tip softener         Arman Filter, NP 11/19/12 [phone]

## 2012-11-19 NOTE — ED Provider Notes (Signed)
Medical screening examination/treatment/procedure(s) were performed by non-physician practitioner and as supervising physician I was immediately available for consultation/collaboration.  Kannan Proia M Bennie Scaff, MD 11/19/12 0226 

## 2012-11-19 NOTE — ED Notes (Signed)
NP at bedside.

## 2012-11-23 ENCOUNTER — Encounter (HOSPITAL_COMMUNITY): Payer: Self-pay | Admitting: Anesthesiology

## 2012-11-23 ENCOUNTER — Emergency Department (HOSPITAL_COMMUNITY): Payer: Medicaid Other

## 2012-11-23 ENCOUNTER — Encounter (HOSPITAL_COMMUNITY)
Admission: EM | Disposition: A | Discharge status: Home / Self Care | Payer: Self-pay | Source: Home / Self Care | Attending: Orthopedic Surgery

## 2012-11-23 ENCOUNTER — Inpatient Hospital Stay (HOSPITAL_COMMUNITY)
Admission: EM | Admit: 2012-11-23 | Discharge: 2012-11-27 | DRG: 603 | Disposition: A | Discharge status: Home / Self Care | Payer: Medicaid Other | Attending: Orthopedic Surgery | Admitting: Orthopedic Surgery

## 2012-11-23 DIAGNOSIS — M129 Arthropathy, unspecified: Secondary | ICD-10-CM | POA: Diagnosis present

## 2012-11-23 DIAGNOSIS — E1149 Type 2 diabetes mellitus with other diabetic neurological complication: Secondary | ICD-10-CM | POA: Diagnosis present

## 2012-11-23 DIAGNOSIS — Z23 Encounter for immunization: Secondary | ICD-10-CM

## 2012-11-23 DIAGNOSIS — E1142 Type 2 diabetes mellitus with diabetic polyneuropathy: Secondary | ICD-10-CM | POA: Diagnosis present

## 2012-11-23 DIAGNOSIS — G8929 Other chronic pain: Secondary | ICD-10-CM | POA: Diagnosis present

## 2012-11-23 DIAGNOSIS — E119 Type 2 diabetes mellitus without complications: Secondary | ICD-10-CM | POA: Diagnosis present

## 2012-11-23 DIAGNOSIS — Z794 Long term (current) use of insulin: Secondary | ICD-10-CM

## 2012-11-23 DIAGNOSIS — B951 Streptococcus, group B, as the cause of diseases classified elsewhere: Secondary | ICD-10-CM | POA: Diagnosis present

## 2012-11-23 DIAGNOSIS — L03119 Cellulitis of unspecified part of limb: Secondary | ICD-10-CM

## 2012-11-23 DIAGNOSIS — L03019 Cellulitis of unspecified finger: Secondary | ICD-10-CM | POA: Diagnosis present

## 2012-11-23 DIAGNOSIS — L02519 Cutaneous abscess of unspecified hand: Principal | ICD-10-CM | POA: Diagnosis present

## 2012-11-23 DIAGNOSIS — H269 Unspecified cataract: Secondary | ICD-10-CM | POA: Diagnosis present

## 2012-11-23 DIAGNOSIS — Z792 Long term (current) use of antibiotics: Secondary | ICD-10-CM

## 2012-11-23 DIAGNOSIS — M549 Dorsalgia, unspecified: Secondary | ICD-10-CM | POA: Diagnosis present

## 2012-11-23 HISTORY — PX: I & D EXTREMITY: SHX5045

## 2012-11-23 HISTORY — DX: Cutaneous abscess, unspecified: L02.91

## 2012-11-23 LAB — BASIC METABOLIC PANEL WITH GFR
BUN: 10 mg/dL (ref 6–23)
CO2: 25 meq/L (ref 19–32)
Calcium: 9.6 mg/dL (ref 8.4–10.5)
Chloride: 89 meq/L — ABNORMAL LOW (ref 96–112)
Creatinine, Ser: 0.73 mg/dL (ref 0.50–1.10)
GFR calc Af Amer: >90 mL/min (ref >90)
GFR calc non Af Amer: >90 mL/min (ref >90)
Glucose, Bld: 328 mg/dL — ABNORMAL HIGH (ref 70–99)
Potassium: 3.8 meq/L (ref 3.5–5.1)
Sodium: 125 meq/L — ABNORMAL LOW (ref 135–145)

## 2012-11-23 LAB — CBC WITH DIFFERENTIAL/PLATELET
Basophils Absolute: 0 K/uL (ref 0.0–0.1)
Basophils Absolute: 0 10*3/uL (ref 0.0–0.1)
Basophils Relative: 0 % (ref 0–1)
Eosinophils Absolute: 0 10*3/uL (ref 0.0–0.7)
Eosinophils Absolute: 0 10*3/uL (ref 0.0–0.7)
Eosinophils Relative: 0 % (ref 0–5)
HCT: 37.5 % (ref 36.0–46.0)
HCT: 35.9 % — ABNORMAL LOW (ref 36.0–46.0)
Hemoglobin: 13.1 g/dL (ref 12.0–15.0)
Lymphocytes Relative: 20 % (ref 12–46)
Lymphs Abs: 3.4 K/uL (ref 0.7–4.0)
Lymphs Abs: 3.9 10*3/uL (ref 0.7–4.0)
MCH: 27 pg (ref 26.0–34.0)
MCHC: 34.9 g/dL (ref 30.0–36.0)
MCHC: 35.7 g/dL (ref 30.0–36.0)
MCV: 77.2 fL — ABNORMAL LOW (ref 78.0–100.0)
MCV: 75.4 fL — ABNORMAL LOW (ref 78.0–100.0)
Monocytes Absolute: 1 K/uL (ref 0.1–1.0)
Monocytes Absolute: 1.2 10*3/uL — ABNORMAL HIGH (ref 0.1–1.0)
Monocytes Relative: 6 % (ref 3–12)
Neutro Abs: 12.4 K/uL — ABNORMAL HIGH (ref 1.7–7.7)
Neutro Abs: 11.9 10*3/uL — ABNORMAL HIGH (ref 1.7–7.7)
Neutrophils Relative %: 74 % (ref 43–77)
Platelets: 290 K/uL (ref 150–400)
Platelets: 261 10*3/uL (ref 150–400)
RBC: 4.86 MIL/uL (ref 3.87–5.11)
RDW: 13.2 % (ref 11.5–15.5)
RDW: 13.5 % (ref 11.5–15.5)
WBC: 16.8 10*3/uL — ABNORMAL HIGH (ref 4.0–10.5)
WBC: 17 10*3/uL — ABNORMAL HIGH (ref 4.0–10.5)

## 2012-11-23 LAB — GLUCOSE, CAPILLARY
Glucose-Capillary: 232 mg/dL — ABNORMAL HIGH (ref 70–99)
Glucose-Capillary: 275 mg/dL — ABNORMAL HIGH (ref 70–99)

## 2012-11-23 LAB — COMPREHENSIVE METABOLIC PANEL
Albumin: 3.4 g/dL — ABNORMAL LOW (ref 3.5–5.2)
Alkaline Phosphatase: 85 U/L (ref 39–117)
BUN: 10 mg/dL (ref 6–23)
Calcium: 9.5 mg/dL (ref 8.4–10.5)
Creatinine, Ser: 0.74 mg/dL (ref 0.50–1.10)
GFR calc Af Amer: >90 mL/min (ref >90)
Glucose, Bld: 326 mg/dL — ABNORMAL HIGH (ref 70–99)
Potassium: 3.9 mEq/L (ref 3.5–5.1)
Total Protein: 8.5 g/dL — ABNORMAL HIGH (ref 6.0–8.3)

## 2012-11-23 LAB — LACTIC ACID, PLASMA: Lactic Acid, Venous: 1.2 mmol/L (ref 0.5–2.2)

## 2012-11-23 SURGERY — IRRIGATION AND DEBRIDEMENT EXTREMITY
Anesthesia: General | Site: Finger | Laterality: Right | Wound class: Dirty or Infected

## 2012-11-23 MED ORDER — ONDANSETRON 4 MG PO TBDP
ORAL_TABLET | ORAL | Status: AC
  Filled 2012-11-23: qty 2

## 2012-11-23 MED ORDER — HYDROMORPHONE HCL PF 1 MG/ML IJ SOLN
1.0000 mg | INTRAMUSCULAR | Status: AC
  Administered 2012-11-23: 1 mg via INTRAVENOUS
  Filled 2012-11-23: qty 1

## 2012-11-23 MED ORDER — SODIUM CHLORIDE 0.9 % IV SOLN
1000.0000 mL | Freq: Once | INTRAVENOUS | Status: AC
  Administered 2012-11-23: 1000 mL via INTRAVENOUS

## 2012-11-23 MED ORDER — SODIUM CHLORIDE 0.9 % IV SOLN
1000.0000 mL | INTRAVENOUS | Status: DC
  Administered 2012-11-23: 1000 mL via INTRAVENOUS

## 2012-11-23 MED ORDER — CLINDAMYCIN PHOSPHATE 600 MG/50ML IV SOLN
600.0000 mg | Freq: Once | INTRAVENOUS | Status: AC
  Administered 2012-11-23: 600 mg via INTRAVENOUS
  Filled 2012-11-23: qty 50

## 2012-11-23 MED ORDER — ACETAMINOPHEN 325 MG PO TABS
650.0000 mg | ORAL_TABLET | Freq: Once | ORAL | Status: AC
  Administered 2012-11-23: 650 mg via ORAL
  Filled 2012-11-23: qty 2

## 2012-11-23 MED ORDER — LACTATED RINGERS IV SOLN
INTRAVENOUS | Status: DC | PRN
  Administered 2012-11-23: via INTRAVENOUS

## 2012-11-23 MED ORDER — ONDANSETRON 4 MG PO TBDP
8.0000 mg | ORAL_TABLET | Freq: Once | ORAL | Status: AC
  Administered 2012-11-23: 8 mg via ORAL
  Filled 2012-11-23: qty 1

## 2012-11-23 SURGICAL SUPPLY — 48 items
BANDAGE ELASTIC 3 VELCRO ST LF (GAUZE/BANDAGES/DRESSINGS) IMPLANT
BANDAGE ELASTIC 4 VELCRO ST LF (GAUZE/BANDAGES/DRESSINGS) ×2 IMPLANT
BANDAGE GAUZE ELAST BULKY 4 IN (GAUZE/BANDAGES/DRESSINGS) ×2 IMPLANT
BNDG CMPR 9X4 STRL LF SNTH (GAUZE/BANDAGES/DRESSINGS)
BNDG COHESIVE 4X5 TAN STRL (GAUZE/BANDAGES/DRESSINGS) ×1 IMPLANT
BNDG ESMARK 4X9 LF (GAUZE/BANDAGES/DRESSINGS) IMPLANT
CHLORAPREP W/TINT 26ML (MISCELLANEOUS) ×2 IMPLANT
CLOTH BEACON ORANGE TIMEOUT ST (SAFETY) ×2 IMPLANT
COVER SURGICAL LIGHT HANDLE (MISCELLANEOUS) ×2 IMPLANT
CUFF TOURNIQUET SINGLE 18IN (TOURNIQUET CUFF) ×2 IMPLANT
CUFF TOURNIQUET SINGLE 24IN (TOURNIQUET CUFF) IMPLANT
DRAPE SURG 17X23 STRL (DRAPES) ×2 IMPLANT
DRSG ADAPTIC 3X8 NADH LF (GAUZE/BANDAGES/DRESSINGS) ×2 IMPLANT
ELECT REM PT RETURN 9FT ADLT (ELECTROSURGICAL)
ELECTRODE REM PT RTRN 9FT ADLT (ELECTROSURGICAL) IMPLANT
EVACUATOR 1/8 PVC DRAIN (DRAIN) IMPLANT
GAUZE XEROFORM 1X8 LF (GAUZE/BANDAGES/DRESSINGS) ×1 IMPLANT
GLOVE BIO SURGEON STRL SZ7.5 (GLOVE) ×3 IMPLANT
GLOVE BIOGEL PI IND STRL 8 (GLOVE) ×1 IMPLANT
GLOVE BIOGEL PI INDICATOR 8 (GLOVE) ×2
GOWN PREVENTION PLUS XXLARGE (GOWN DISPOSABLE) ×2 IMPLANT
GOWN STRL NON-REIN LRG LVL3 (GOWN DISPOSABLE) ×6 IMPLANT
HANDPIECE INTERPULSE COAX TIP (DISPOSABLE)
KIT BASIN OR (CUSTOM PROCEDURE TRAY) ×2 IMPLANT
KIT ROOM TURNOVER OR (KITS) ×2 IMPLANT
MANIFOLD NEPTUNE II (INSTRUMENTS) ×2 IMPLANT
NS IRRIG 1000ML POUR BTL (IV SOLUTION) ×2 IMPLANT
PACK ORTHO EXTREMITY (CUSTOM PROCEDURE TRAY) ×2 IMPLANT
PAD ARMBOARD 7.5X6 YLW CONV (MISCELLANEOUS) ×4 IMPLANT
PADDING CAST ABS 4INX4YD NS (CAST SUPPLIES) ×1
PADDING CAST ABS COTTON 4X4 ST (CAST SUPPLIES) IMPLANT
SET HNDPC FAN SPRY TIP SCT (DISPOSABLE) IMPLANT
SLING ARM FOAM STRAP XLG (SOFTGOODS) ×1 IMPLANT
SPONGE GAUZE 4X4 12PLY (GAUZE/BANDAGES/DRESSINGS) ×2 IMPLANT
SPONGE LAP 18X18 X RAY DECT (DISPOSABLE) IMPLANT
STOCKINETTE IMPERVIOUS 9X36 MD (GAUZE/BANDAGES/DRESSINGS) IMPLANT
SUCTION FRAZIER TIP 10 FR DISP (SUCTIONS) ×1 IMPLANT
SUT ETHILON 4 0 P 3 18 (SUTURE) ×1 IMPLANT
SUT ETHILON 4 0 PS 2 18 (SUTURE) IMPLANT
SUT VICRYL RAPIDE 4/0 PS 2 (SUTURE) IMPLANT
TOWEL OR 17X24 6PK STRL BLUE (TOWEL DISPOSABLE) ×2 IMPLANT
TOWEL OR 17X26 10 PK STRL BLUE (TOWEL DISPOSABLE) ×2 IMPLANT
TUBE ANAEROBIC SPECIMEN COL (MISCELLANEOUS) IMPLANT
TUBE CONNECTING 12X1/4 (SUCTIONS) ×2 IMPLANT
TUBE FEEDING 8FR 16IN STR KANG (MISCELLANEOUS) ×1 IMPLANT
UNDERPAD 30X30 INCONTINENT (UNDERPADS AND DIAPERS) ×2 IMPLANT
WATER STERILE IRR 1000ML POUR (IV SOLUTION) ×2 IMPLANT
YANKAUER SUCT BULB TIP NO VENT (SUCTIONS) ×1 IMPLANT

## 2012-11-23 NOTE — Anesthesia Preprocedure Evaluation (Addendum)
Anesthesia Evaluation  Patient identified by MRN, date of birth, ID band Patient awake    Reviewed: Allergy & Precautions, H&P , NPO status , Patient's Chart, lab work & pertinent test results, reviewed documented beta blocker date and time   Airway Mallampati: II      Dental  (+) Teeth Intact and Dental Advisory Given   Pulmonary  breath sounds clear to auscultation        Cardiovascular Rhythm:Regular Rate:Normal     Neuro/Psych    GI/Hepatic   Endo/Other  diabetes, Poorly Controlled, Insulin DependentMorbid obesity  Renal/GU      Musculoskeletal  (+) Arthritis -,   Abdominal (+) + obese,   Peds  Hematology   Anesthesia Other Findings   Reproductive/Obstetrics                        Anesthesia Physical Anesthesia Plan  ASA: III  Anesthesia Plan: General   Post-op Pain Management:    Induction: Intravenous  Airway Management Planned: Oral ETT  Additional Equipment:   Intra-op Plan:   Post-operative Plan: Extubation in OR  Informed Consent: I have reviewed the patients History and Physical, chart, labs and discussed the procedure including the risks, benefits and alternatives for the proposed anesthesia with the patient or authorized representative who has indicated his/her understanding and acceptance.   Dental advisory given  Plan Discussed with: CRNA and Anesthesiologist  Anesthesia Plan Comments: (R. Long finger abscess with cellulitis Type 1 DM glucose 232 Obesity  Plan GA with oral ETT  Kipp Brood, MD)       Anesthesia Quick Evaluation

## 2012-11-23 NOTE — ED Provider Notes (Signed)
History     CSN: [id number]  Arrival date & time 11/23/12  1653   First MD Initiated Contact with Patient 11/23/12 1929      Chief Complaint  Patient presents with  . Hand Pain    (Consider location/radiation/quality/duration/timing/severity/associated sxs/prior treatment) HPI  37 y/o female presents to the ED from her primary care office for infection of the right middle finger.  The patient was had a manicure on sat 11/14/2012.  She was seen at [facility] emergency department on 11/19/2012 where she had incision and drainage for paronychia.  Patient was discharged on Keflex.  Since that time she has had worsening swelling, severe pain and discharge from the finger.  Patient was seen by her primary care physician today.  She had severe pain in the finger including chills and body aches.  She was sent to the emergency department for admission by her physician.  Past Medical History  Diagnosis Date  . Diabetes mellitus   . Urinary tract infection     hx of  . Diabetic neuropathy   . Arthritis   . Herpes virus disease   . Chronic back pain   . Hip fracture     left side  . Cataracts, bilateral     Past Surgical History  Procedure Laterality Date  . Fracture surgery      surgery left hip  . Cesarean section      x 2  . Cataract extraction      left  . Cataract extraction w/phaco  06/12/2012    Procedure: CATARACT EXTRACTION PHACO AND INTRAOCULAR LENS PLACEMENT (IOC);  Surgeon: [name], MD;  Location: [facility] OR;  Service: Ophthalmology;  Laterality: Right;  . Cataract extraction w/phaco  06/15/2012    Procedure: CATARACT EXTRACTION PHACO AND INTRAOCULAR LENS PLACEMENT (IOC);  Surgeon: [name], MD;  Location: [facility] OR;  Service: Ophthalmology;  Laterality: Left;    No family history on file.  History  Substance Use Topics  . Smoking status: Never Smoker   . Smokeless tobacco: Not on file  . Alcohol Use: No    OB History   Grav Para Term Preterm Abortions TAB SAB  Ect Mult Living                  Review of Systems Ten systems reviewed and are negative for acute change, except as noted in the HPI.   Allergies  Phenergan and Tramadol  Home Medications   Current Outpatient Rx  Name  Route  Sig  Dispense  Refill  . cyclobenzaprine (FLEXERIL) 10 MG tablet   Oral   Take 10 mg by mouth 3 (three) times daily as needed for muscle spasms.         . insulin NPH-insulin regular (NOVOLIN 70/30) (70-30) 100 UNIT/ML injection   Subcutaneous   Inject 30-45 Units into the skin 2 (two) times daily with a meal. 45 units in am,  30 units pm         . simvastatin (ZOCOR) 10 MG tablet   Oral   Take 10 mg by mouth at bedtime.           BP 127/60  Pulse 114  Temp(Src) 100.5 F (38.1 C) (Oral)  Resp 18  SpO2 100%  LMP 11/14/2012  Physical Exam  Musculoskeletal:       Hands:  Physical Exam  Nursing note and vitals reviewed. Constitutional: She is oriented to person, place, and time.  Obese female.  She appears very  uncomfortable.  No acute distress. HENT:  Head: Normocephalic and atraumatic.  Eyes: Conjunctivae normal and EOM are normal. Pupils are equal, round, and reactive to light. No scleral icterus.  Neck: Normal range of motion.  Cardiovascular: Tachycardic, regular rhythm and normal heart sounds.  Exam reveals no gallop and no friction rub.   No murmur heard. Pulmonary/Chest: Effort normal and breath sounds normal. No respiratory distress.  Abdominal: Soft. Bowel sounds are normal. She exhibits no distension and no mass. There is no tenderness. There is no guarding.  Neurological: She is alert and oriented to person, place, and time.  Skin: Skin is warm and dry. She is not diaphoretic.    ED Course  Procedures (including critical care time)  Labs Reviewed  CBC WITH DIFFERENTIAL - Abnormal; Notable for the following:    WBC 16.8 (*)    MCV 77.2 (*)    Neutro Abs 12.4 (*)    All other components within normal limits  BASIC  METABOLIC PANEL - Abnormal; Notable for the following:    Sodium 125 (*)    Chloride 89 (*)    Glucose, Bld 328 (*)    All other components within normal limits  GLUCOSE, CAPILLARY - Abnormal; Notable for the following:    Glucose-Capillary 310 (*)    All other components within normal limits  CBC WITH DIFFERENTIAL - Abnormal; Notable for the following:    WBC 17.0 (*)    HCT 35.9 (*)    MCV 75.4 (*)    Neutro Abs 11.9 (*)    Monocytes Absolute 1.2 (*)    All other components within normal limits  COMPREHENSIVE METABOLIC PANEL - Abnormal; Notable for the following:    Sodium 127 (*)    Chloride 89 (*)    Glucose, Bld 326 (*)    Total Protein 8.5 (*)    Albumin 3.4 (*)    Total Bilirubin 0.2 (*)    All other components within normal limits  GLUCOSE, CAPILLARY - Abnormal; Notable for the following:    Glucose-Capillary 275 (*)    All other components within normal limits  CULTURE, BLOOD (ROUTINE X 2)  CULTURE, BLOOD (ROUTINE X 2)  URINE CULTURE  LACTIC ACID, PLASMA  URINALYSIS, ROUTINE W REFLEX MICROSCOPIC   No results found.   1. Cellulitis and abscess of hand       MDM  10:50 PM Patient with meets early sepsis criteria.  Begun on clindamycin IV.  I have consulted with Dr. Janee Morn od [facility] who will admit the patient for surgery and debridement.        Arthor Captain, PA-C 11/23/12 2302  Arthor Captain, PA-C 11/24/12 0001

## 2012-11-23 NOTE — ED Notes (Signed)
Presents with right middle finger infection, purulent drainage, hand edema, foul odor since this AM. Pt is tachycardic 130s and febrile 100.5. Pain is described as severe.  Pt was seen at [facility] ER on Thursday morning for right middle finger pain, no swelling or drainage at the time. CMS intact.

## 2012-11-23 NOTE — Consult Note (Signed)
ORTHOPAEDIC CONSULTATION  REQUESTING PHYSICIAN: Gavin Pound. Ghim, MD  Chief Complaint: Right long finger pain and swelling and drainage  HPI: [name] is a 37 y.o. female who complains of  right long finger pain, swelling, and drainage. She is an insulin-requiring diabetic who reports that she had a manicure on Saturday, 11-14-12 and the following day began to have some pain and swelling of the digit. She presented to the emergency department at [facility] on 11-19-12, where apparently a paronychia was treated or drained in some fashion she was placed on Keflex. No specific followup reevaluation was arranged. The patient presented to her primary care physician today because of increasing and worsening pain and swelling, beginning of fevers and chills, and increased drainage from the finger. She was sent from her primary physician to the emergency department.  Past Medical History  Diagnosis Date  . Diabetes mellitus   . Urinary tract infection     hx of  . Diabetic neuropathy   . Arthritis   . Herpes virus disease   . Chronic back pain   . Hip fracture     left side  . Cataracts, bilateral    Past Surgical History  Procedure Laterality Date  . Fracture surgery      surgery left hip  . Cesarean section      x 2  . Cataract extraction      left  . Cataract extraction w/phaco  06/12/2012    Procedure: CATARACT EXTRACTION PHACO AND INTRAOCULAR LENS PLACEMENT (IOC);  Surgeon: [name], MD;  Location: [facility] OR;  Service: Ophthalmology;  Laterality: Right;  . Cataract extraction w/phaco  06/15/2012    Procedure: CATARACT EXTRACTION PHACO AND INTRAOCULAR LENS PLACEMENT (IOC);  Surgeon: [name], MD;  Location: [facility] OR;  Service: Ophthalmology;  Laterality: Left;   History   Social History  . Marital Status: Single    Spouse Name: N/A    Number of Children: N/A  . Years of Education: N/A   Social History Main Topics  . Smoking status: Never Smoker   . Smokeless  tobacco: Not on file  . Alcohol Use: No  . Drug Use: No  . Sexually Active: Not Currently   Other Topics Concern  . Not on file   Social History Narrative  . No narrative on file   No family history on file. Allergies  Allergen Reactions  . Phenergan (Promethazine Hcl)     Does not know reaction  . Tramadol     Unknown   Prior to Admission medications   Medication Sig Start Date End Date Taking? Authorizing Provider  cyclobenzaprine (FLEXERIL) 10 MG tablet Take 10 mg by mouth 3 (three) times daily as needed for muscle spasms.   Yes Historical Provider, MD  insulin NPH-insulin regular (NOVOLIN 70/30) (70-30) 100 UNIT/ML injection Inject 30-45 Units into the skin 2 (two) times daily with a meal. 45 units in am,  30 units pm   Yes Historical Provider, MD  simvastatin (ZOCOR) 10 MG tablet Take 10 mg by mouth at bedtime.   Yes Historical Provider, MD   No results found.  Positive ROS: All other systems have been reviewed and were otherwise negative with the exception of those mentioned in the HPI and as above.  Physical Exam: Vitals: Refer to EMR. Constitutional:  WD, WN, NAD HEENT:  NCAT, EOMI Neuro/Psych:  Alert & oriented to person, place, and time; appropriate mood & affect Lymphatic: No generalized UE edema or lymphadenopathy Extremities /  MSK:  The extremities are normal with respect to appearance, ranges of motion, joint stability, muscle strength/tone, sensation, & perfusion except as otherwise noted:   The right long finger is swollen. There is tightness of the digit. With pressure on the pulp, purulence emanates from the edge of the nail and the nail fold on the radial side. The fingers held with only a slight flexed posture and can be fully extended without excruciating pain but the digit itself is fairly tense with circumferential swelling. There is minimal pain with palpation in the palm along any of the flexor tendons. She can detect light touch sensibility in the finger  but reports that it is a little tingly.  Assessment: Right long finger paronychia/felon with possible spreading subcutaneous infection. This may also represent early suppurative flexor tenosynovitis.  Plan: I discussed these findings with the patient recommended surgical drainage in the operative theater. Complications of such infections are reviewed, including chronic pain and stiffness, and even digital loss. Questions were invited and answered and informed consent obtained. She'll be admitted for broad-spectrum IV antibiotics following the operative treatment and infectious diseases will be consulted to manage the medical aspects of her treatment, including current antibiotic selection, appropriate antibiotics for discharge, duration of antibiotic therapy, etc.

## 2012-11-23 NOTE — ED Notes (Signed)
Consent signed and with patient to OR with patient stickers and EKG

## 2012-11-23 NOTE — Preoperative (Signed)
Beta Blockers   Reason not to administer Beta Blockers:Not Applicable. No home beta blockers 

## 2012-11-24 ENCOUNTER — Encounter (HOSPITAL_COMMUNITY): Payer: Self-pay | Admitting: Anesthesiology

## 2012-11-24 ENCOUNTER — Encounter (HOSPITAL_COMMUNITY): Payer: Self-pay | Admitting: General Practice

## 2012-11-24 ENCOUNTER — Emergency Department (HOSPITAL_COMMUNITY): Payer: Medicaid Other | Admitting: Anesthesiology

## 2012-11-24 LAB — CBC WITH DIFFERENTIAL/PLATELET
Basophils Absolute: 0 10*3/uL (ref 0.0–0.1)
Lymphs Abs: 3.7 10*3/uL (ref 0.7–4.0)
MCH: 26.6 pg (ref 26.0–34.0)
MCV: 76.6 fL — ABNORMAL LOW (ref 78.0–100.0)
Monocytes Absolute: 1.4 10*3/uL — ABNORMAL HIGH (ref 0.1–1.0)
Neutrophils Relative %: 71 % (ref 43–77)
Platelets: 246 10*3/uL (ref 150–400)
RDW: 13.6 % (ref 11.5–15.5)
WBC: 17.6 10*3/uL — ABNORMAL HIGH (ref 4.0–10.5)

## 2012-11-24 LAB — GLUCOSE, CAPILLARY
Glucose-Capillary: 232 mg/dL — ABNORMAL HIGH (ref 70–99)
Glucose-Capillary: 263 mg/dL — ABNORMAL HIGH (ref 70–99)

## 2012-11-24 MED ORDER — SODIUM CHLORIDE 0.9 % IR SOLN
Status: DC | PRN
  Administered 2012-11-24: 1000 mL

## 2012-11-24 MED ORDER — ONDANSETRON HCL 4 MG/2ML IJ SOLN
INTRAMUSCULAR | Status: DC | PRN
  Administered 2012-11-24: 4 mg via INTRAVENOUS

## 2012-11-24 MED ORDER — INSULIN NPH ISOPHANE & REGULAR (70-30) 100 UNIT/ML ~~LOC~~ SUSP: 30.0000 [IU] | Freq: Two times a day (BID) | SUBCUTANEOUS | Status: DC

## 2012-11-24 MED ORDER — HYDROMORPHONE HCL PF 1 MG/ML IJ SOLN
0.2500 mg | INTRAMUSCULAR | Status: DC | PRN
  Administered 2012-11-24 (×2): 0.5 mg via INTRAVENOUS

## 2012-11-24 MED ORDER — ONDANSETRON HCL 4 MG PO TABS
4.0000 mg | ORAL_TABLET | Freq: Four times a day (QID) | ORAL | Status: DC | PRN
  Filled 2012-11-24: qty 1

## 2012-11-24 MED ORDER — PROPOFOL 10 MG/ML IV BOLUS
INTRAVENOUS | Status: DC | PRN
  Administered 2012-11-24: 170 mg via INTRAVENOUS
  Administered 2012-11-24: 30 mg via INTRAVENOUS

## 2012-11-24 MED ORDER — LIDOCAINE HCL (CARDIAC) 20 MG/ML IV SOLN
INTRAVENOUS | Status: DC | PRN
  Administered 2012-11-24: 50 mg via INTRAVENOUS

## 2012-11-24 MED ORDER — DIPHENHYDRAMINE HCL 25 MG PO CAPS: 25.0000 mg | ORAL_CAPSULE | Freq: Four times a day (QID) | ORAL | Status: DC | PRN

## 2012-11-24 MED ORDER — HYDROMORPHONE HCL PF 1 MG/ML IJ SOLN
INTRAMUSCULAR | Status: AC
  Filled 2012-11-24: qty 1

## 2012-11-24 MED ORDER — SIMVASTATIN 10 MG PO TABS
10.0000 mg | ORAL_TABLET | Freq: Every day | ORAL | Status: DC
  Administered 2012-11-24 – 2012-11-26 (×3): 10 mg via ORAL
  Filled 2012-11-24 (×4): qty 1

## 2012-11-24 MED ORDER — CYCLOBENZAPRINE HCL 10 MG PO TABS: 10.0000 mg | ORAL_TABLET | Freq: Three times a day (TID) | ORAL | Status: DC | PRN

## 2012-11-24 MED ORDER — ONDANSETRON HCL 4 MG/2ML IJ SOLN: 4.0000 mg | Freq: Four times a day (QID) | INTRAMUSCULAR | Status: DC | PRN

## 2012-11-24 MED ORDER — HYDROMORPHONE HCL PF 1 MG/ML IJ SOLN
0.5000 mg | INTRAMUSCULAR | Status: DC | PRN
  Administered 2012-11-26: 0.5 mg via INTRAVENOUS
  Filled 2012-11-24: qty 1

## 2012-11-24 MED ORDER — MAGNESIUM HYDROXIDE 400 MG/5ML PO SUSP: 30.0000 mL | Freq: Every day | ORAL | Status: DC | PRN

## 2012-11-24 MED ORDER — INFLUENZA VIRUS VACC SPLIT PF IM SUSP
0.5000 mL | INTRAMUSCULAR | Status: AC
  Filled 2012-11-24 (×3): qty 0.5

## 2012-11-24 MED ORDER — ACETAMINOPHEN 10 MG/ML IV SOLN
1000.0000 mg | Freq: Once | INTRAVENOUS | Status: AC | PRN
  Administered 2012-11-24: 1000 mg via INTRAVENOUS

## 2012-11-24 MED ORDER — PIPERACILLIN-TAZOBACTAM 3.375 G IVPB
3.3750 g | Freq: Three times a day (TID) | INTRAVENOUS | Status: DC
  Administered 2012-11-24 – 2012-11-26 (×8): 3.375 g via INTRAVENOUS
  Filled 2012-11-24 (×10): qty 50

## 2012-11-24 MED ORDER — INSULIN ASPART PROT & ASPART (70-30 MIX) 100 UNIT/ML ~~LOC~~ SUSP
45.0000 [IU] | Freq: Every day | SUBCUTANEOUS | Status: DC
  Administered 2012-11-24 – 2012-11-27 (×4): 45 [IU] via SUBCUTANEOUS
  Filled 2012-11-24: qty 10

## 2012-11-24 MED ORDER — MIDAZOLAM HCL 5 MG/5ML IJ SOLN
INTRAMUSCULAR | Status: DC | PRN
  Administered 2012-11-24: 2 mg via INTRAVENOUS

## 2012-11-24 MED ORDER — SUCCINYLCHOLINE CHLORIDE 20 MG/ML IJ SOLN
INTRAMUSCULAR | Status: DC | PRN
  Administered 2012-11-24: 140 mg via INTRAVENOUS

## 2012-11-24 MED ORDER — INSULIN ASPART 100 UNIT/ML ~~LOC~~ SOLN
SUBCUTANEOUS | Status: DC | PRN
  Administered 2012-11-24: 8 [IU] via SUBCUTANEOUS

## 2012-11-24 MED ORDER — VANCOMYCIN HCL 10 G IV SOLR
1500.0000 mg | Freq: Once | INTRAVENOUS | Status: AC
  Administered 2012-11-24: 1500 mg via INTRAVENOUS
  Filled 2012-11-24: qty 1500

## 2012-11-24 MED ORDER — ONDANSETRON HCL 4 MG/2ML IJ SOLN: 4.0000 mg | Freq: Once | INTRAMUSCULAR | Status: DC | PRN

## 2012-11-24 MED ORDER — DOCUSATE SODIUM 100 MG PO CAPS
100.0000 mg | ORAL_CAPSULE | Freq: Two times a day (BID) | ORAL | Status: DC
  Administered 2012-11-24 – 2012-11-27 (×7): 100 mg via ORAL
  Filled 2012-11-24 (×9): qty 1

## 2012-11-24 MED ORDER — OXYCODONE-ACETAMINOPHEN 5-325 MG PO TABS
1.0000 | ORAL_TABLET | ORAL | Status: DC | PRN
  Administered 2012-11-24 – 2012-11-25 (×6): 2 via ORAL
  Administered 2012-11-25: 1 via ORAL
  Administered 2012-11-25 – 2012-11-27 (×5): 2 via ORAL
  Filled 2012-11-24 (×12): qty 2

## 2012-11-24 MED ORDER — VITAMIN C 500 MG PO TABS
1000.0000 mg | ORAL_TABLET | Freq: Every day | ORAL | Status: DC
  Administered 2012-11-24 – 2012-11-27 (×4): 1000 mg via ORAL
  Filled 2012-11-24 (×4): qty 2

## 2012-11-24 MED ORDER — VANCOMYCIN HCL IN DEXTROSE 1-5 GM/200ML-% IV SOLN
1000.0000 mg | Freq: Three times a day (TID) | INTRAVENOUS | Status: DC
  Administered 2012-11-24 – 2012-11-26 (×7): 1000 mg via INTRAVENOUS
  Filled 2012-11-24 (×8): qty 200

## 2012-11-24 MED ORDER — INSULIN ASPART PROT & ASPART (70-30 MIX) 100 UNIT/ML ~~LOC~~ SUSP
30.0000 [IU] | Freq: Every day | SUBCUTANEOUS | Status: DC
  Administered 2012-11-24 – 2012-11-25 (×2): 30 [IU] via SUBCUTANEOUS
  Filled 2012-11-24: qty 10

## 2012-11-24 MED ORDER — ACETAMINOPHEN 10 MG/ML IV SOLN
INTRAVENOUS | Status: AC
  Filled 2012-11-24: qty 100

## 2012-11-24 MED ORDER — FENTANYL CITRATE 0.05 MG/ML IJ SOLN
INTRAMUSCULAR | Status: DC | PRN
  Administered 2012-11-24: 50 ug via INTRAVENOUS
  Administered 2012-11-24: 150 ug via INTRAVENOUS
  Administered 2012-11-24 (×2): 25 ug via INTRAVENOUS

## 2012-11-24 MED ORDER — HYDROCODONE-ACETAMINOPHEN 5-325 MG PO TABS: 1.0000 | ORAL_TABLET | ORAL | Status: DC | PRN

## 2012-11-24 NOTE — Progress Notes (Signed)
UR COMPLETED  

## 2012-11-24 NOTE — Transfer of Care (Signed)
Immediate Anesthesia Transfer of Care Note  Patient: [name]  Procedure(s) Performed: Procedure(s): IRRIGATION AND DEBRIDEMENT EXTREMITY (Right)  Patient Location: PACU  Anesthesia Type:General  Level of Consciousness: sedated  Airway & Oxygen Therapy: Patient Spontanous Breathing and Patient connected to nasal cannula oxygen  Post-op Assessment: Report given to PACU RN and Post -op Vital signs reviewed and stable  Post vital signs: Reviewed and stable  Complications: No apparent anesthesia complications

## 2012-11-24 NOTE — Progress Notes (Signed)
ANTIBIOTIC CONSULT NOTE - INITIAL  Pharmacy Consult for Vancomycin and Zosyn  Indication: Right hand/finger infection  Allergies  Allergen Reactions  . Phenergan (Promethazine Hcl)     Does not know reaction  . Tramadol     Unknown    Patient Measurements:   Body Weight: 108 kg (05/2012)  Vital Signs: Temp: 99.5 F (37.5 C) (03/11 0100) Temp src: Oral (03/10 2049) BP: 138/73 mmHg (03/11 0115) Pulse Rate: 111 (03/11 0115) Intake/Output from previous day: 03/10 0701 - 03/11 0700 In: 600 [I.V.:600] Out: 0  Intake/Output from this shift: Total I/O In: 600 [I.V.:600] Out: 0   Labs:  Recent Labs  11/23/12 1729 11/23/12 2031  WBC 16.8* 17.0*  HGB 13.1 12.8  PLT 290 261  CREATININE 0.73 0.74   The CrCl is unknown because both a height and weight (above a minimum accepted value) are required for this calculation. No results found for this basename: VANCOTROUGH, VANCOPEAK, VANCORANDOM, GENTTROUGH, GENTPEAK, GENTRANDOM, TOBRATROUGH, TOBRAPEAK, TOBRARND, AMIKACINPEAK, AMIKACINTROU, AMIKACIN,  in the last 72 hours   Microbiology: No results found for this or any previous visit (from the past 720 hour(s)).  Medical History: Past Medical History  Diagnosis Date  . Diabetes mellitus   . Urinary tract infection     hx of  . Diabetic neuropathy   . Arthritis   . Herpes virus disease   . Chronic back pain   . Hip fracture     left side  . Cataracts, bilateral     Medications:  Prescriptions prior to admission  Medication Sig Dispense Refill  . cyclobenzaprine (FLEXERIL) 10 MG tablet Take 10 mg by mouth 3 (three) times daily as needed for muscle spasms.      . insulin NPH-insulin regular (NOVOLIN 70/30) (70-30) 100 UNIT/ML injection Inject 30-45 Units into the skin 2 (two) times daily with a meal. 45 units in am,  30 units pm      . simvastatin (ZOCOR) 10 MG tablet Take 10 mg by mouth at bedtime.       Assessment: 37 yo female s/p I&D right hand and finger for  empiric antibiotics  Goal of Therapy:  Vancomycin trough level 10-15 mcg/ml  Plan:  Vancomycin 1500 mg IV now, then 1 g IV q8h Zosyn 3.375 g IV q8h  Eddie Candle 11/24/2012,1:19 AM

## 2012-11-24 NOTE — Anesthesia Postprocedure Evaluation (Signed)
  Anesthesia Post-op Note  Patient: [name]  Procedure(s) Performed: Procedure(s): IRRIGATION AND DEBRIDEMENT EXTREMITY (Right)  Patient Location: PACU  Anesthesia Type:General  Level of Consciousness: awake, alert  and oriented  Airway and Oxygen Therapy: Patient Spontanous Breathing and Patient connected to nasal cannula oxygen  Post-op Pain: mild  Post-op Assessment: Post-op Vital signs reviewed, Patient's Cardiovascular Status Stable, Respiratory Function Stable, Patent Airway, No signs of Nausea or vomiting and Pain level controlled  Post-op Vital Signs: stable  Complications: No apparent anesthesia complications

## 2012-11-24 NOTE — ED Provider Notes (Signed)
Medical screening examination/treatment/procedure(s) were conducted as a shared visit with non-physician practitioner(s) and myself.  I personally evaluated the patient during the encounter  Pt with low grade fever, mildly tachycardic.   Pt with manicure several days prior, increase in pain.  Attempted incision made in ED 4 days ago for paronychia with some purulent drainage.  Put on keflex.  Pt's symptoms worsened gradually, saw PMD today and due to presence of systemic features and sig swelling and pain to finger and hand, sent to the ED for further.  Hand surgeon consulted.  Will start empiric abx and will need admission and likely operation for I&D.  Clinicaly concern for infectious tenosynovitis is present with associated cellulitis.  Pt with sig h/o DM.    Gavin Pound. Ghim, MD 11/24/12 1517

## 2012-11-24 NOTE — Op Note (Signed)
11/23/2012 - 11/24/2012  1:12 AM  PATIENT:  [name]  37 y.o. female  PRE-OPERATIVE DIAGNOSIS:  Right long finger infection  POST-OPERATIVE DIAGNOSIS:  Same  PROCEDURE:  Right long finger removal of nail plate, incision and drainage of finger, incision and drainage of palm and dorsum of hand  SURGEON: Cliffton Asters. Janee Morn, MD  PHYSICIAN ASSISTANT: None  ANESTHESIA:  general  SPECIMENS:  None  DRAINS:   None  PREOPERATIVE INDICATIONS:  [name] is a  37 y.o. female with a diagnosis of right long finger infection, with draining pus.  The risks benefits and alternatives were discussed with the patient preoperatively including but not limited to the risks of infection, bleeding, nerve injury, cardiopulmonary complications, the need for revision surgery, among others, and the patient verbalized understanding and consented to proceed.  OPERATIVE IMPLANTS: None  OPERATIVE FINDINGS: Subcutaneous purulence, mostly on the dorsum of the finger.  OPERATIVE PROCEDURE:  The patient was escorted to the operative theatre and placed in a supine position.  A surgical "time-out" was performed during which the planned procedure, proposed operative site, and the correct patient identity were compared to the operative consent and agreement confirmed by the circulating nurse according to current facility policy.  Following application of a tourniquet to the operative extremity, the exposed skin was prepped with Chloraprep and draped in the usual sterile fashion.  The limb was exsanguinated with an Esmarch bandage and the tourniquet inflated to approximately higher than systolic BP.  The nail plate was removed. There was pus under it and some necrosis of the base of the nail bed. There was extension of the space into the dorsal subcutaneous space. An incision was made on the mid lateral line radially along the distal phalanx, middle phalanx, proximal phalanx. Through these incisions,  spreading dissection was carried out on the dorsum and frank purulence was discovered there. With a volar spreading volar to the flexor sheath there did not appear to be any pus there. There was another incision made on the dorsum of the hand at the level of the MP joint so that irrigation can be introduced there and washed out the other distal wounds. In addition incision was made over the A1 pulley volarly and subcutaneous volar space washed in that manner as well. Cultures were obtained from 2 different sites, one of the nail bed and the other from the subcutaneous space of the proximal phalanx level. Once everything was copiously irrigated, tourniquet was released and the stitch was placed into the proximal palmar wound as well as the proximal dorsal wound. The others were allowed to remain draining. A dressing was applied consisting of Xeroform on the nail bed and Adaptic on the remainder of the and a bulky hand dressing to follow. She was awakened and taken to room stable condition breathing spontaneously.  DISPOSITION: She'll be admitted for IV antibiotics, infectious disease consultation and arrangement for outpatient antibiotics.

## 2012-11-24 NOTE — Progress Notes (Signed)
Inpatient Diabetes Program Recommendations  AACE/ADA: New Consensus Statement on Inpatient Glycemic Control (2013)  Target Ranges:  Prepandial:   less than 140 mg/dL      Peak postprandial:   less than 180 mg/dL (1-2 hours)      Critically ill patients:  140 - 180 mg/dL  Results for [name], [name] (MRN [id number]) as of 11/24/2012 11:13  Ref. Range 11/23/2012 17:52 11/23/2012 21:34 11/23/2012 23:53 11/24/2012 01:10 11/24/2012 06:47  Glucose-Capillary Latest Range: 70-99 mg/dL 409 (H) 811 (H) 914 (H) 232 (H) 263 (H)   Inpatient Diabetes Program Recommendations Correction (SSI): Start Novolog moderate scale TID + HS  HgbA1C: order to assess prehospital glucose control  Thank you  Piedad Climes BSN, RN,CDE Inpatient Diabetes Coordinator [phone] (team pager)

## 2012-11-25 ENCOUNTER — Encounter (HOSPITAL_COMMUNITY): Payer: Self-pay | Admitting: Orthopedic Surgery

## 2012-11-25 DIAGNOSIS — L089 Local infection of the skin and subcutaneous tissue, unspecified: Secondary | ICD-10-CM

## 2012-11-25 DIAGNOSIS — E119 Type 2 diabetes mellitus without complications: Secondary | ICD-10-CM | POA: Diagnosis present

## 2012-11-25 LAB — HEMOGLOBIN A1C
Hgb A1c MFr Bld: 11.3 % — ABNORMAL HIGH (ref <5.7)
Mean Plasma Glucose: 278 mg/dL — ABNORMAL HIGH (ref <117)

## 2012-11-25 LAB — CBC
HCT: 30.2 % — ABNORMAL LOW (ref 36.0–46.0)
MCV: 76.6 fL — ABNORMAL LOW (ref 78.0–100.0)
Platelets: 250 10*3/uL (ref 150–400)
RBC: 3.94 MIL/uL (ref 3.87–5.11)
WBC: 17.3 10*3/uL — ABNORMAL HIGH (ref 4.0–10.5)

## 2012-11-25 LAB — C-REACTIVE PROTEIN: CRP: 31 mg/dL — ABNORMAL HIGH (ref <0.60)

## 2012-11-25 LAB — GLUCOSE, CAPILLARY
Glucose-Capillary: 153 mg/dL — ABNORMAL HIGH (ref 70–99)
Glucose-Capillary: 68 mg/dL — ABNORMAL LOW (ref 70–99)
Glucose-Capillary: 114 mg/dL — ABNORMAL HIGH (ref 70–99)

## 2012-11-25 LAB — VANCOMYCIN, TROUGH: Vancomycin Tr: 30.3 ug/mL (ref 10.0–20.0)

## 2012-11-25 MED ORDER — OXYCODONE-ACETAMINOPHEN 5-325 MG PO TABS: 1.0000 | ORAL_TABLET | ORAL | Status: DC | PRN

## 2012-11-25 MED ORDER — INSULIN ASPART 100 UNIT/ML ~~LOC~~ SOLN
0.0000 [IU] | Freq: Three times a day (TID) | SUBCUTANEOUS | Status: DC
  Administered 2012-11-25 – 2012-11-26 (×2): 5 [IU] via SUBCUTANEOUS

## 2012-11-25 MED ORDER — DSS 100 MG PO CAPS: 100.0000 mg | ORAL_CAPSULE | Freq: Two times a day (BID) | ORAL | Status: DC

## 2012-11-25 MED ORDER — ASCORBIC ACID 1000 MG PO TABS: 1000.0000 mg | ORAL_TABLET | Freq: Every day | ORAL | Status: DC

## 2012-11-25 NOTE — Progress Notes (Signed)
pateint refused pain medication at 1750 because she had not eaten anything. Offered applesauce, graham crackers, and saltine crackers. Did not want any of these foods. Refused pain medication again at 1850.

## 2012-11-25 NOTE — Progress Notes (Signed)
Subjective: POD 1 R LF I&D of abscess Systemic symptoms better, hand/FA swollen and painful   Objective: Vital signs in last 24 hours: Temp:  [98 F (36.7 C)-99.8 F (37.7 C)] 98 F (36.7 C) (03/11 2330) Pulse Rate:  [87-109] 87 (03/11 2330) Resp:  [18-20] 18 (03/11 2330) BP: (126-130)/(65-68) 130/68 mmHg (03/11 2330) SpO2:  [100 %] 100 % (03/11 2330)  Intake/Output from previous day: 03/11 0701 - 03/12 0700 In: 560 [P.O.:560] Out: -  Intake/Output this shift:     Recent Labs  11/23/12 1729 11/23/12 2031 11/24/12 0602  HGB 13.1 12.8 10.9*    Recent Labs  11/23/12 2031 11/24/12 0602  WBC 17.0* 17.6*  RBC 4.76 4.10  HCT 35.9* 31.4*  PLT 261 246    Recent Labs  11/23/12 1729 11/23/12 2031  NA 125* 127*  K 3.8 3.9  CL 89* 89*  CO2 25 24  BUN 10 10  CREATININE 0.73 0.74  GLUCOSE 328* 326*  CALCIUM 9.6 9.5   No results found for this basename: LABPT, INR,  in the last 72 hours  R LF and hand/distal forearm swollen.  Fingers stiff, held in near full extension No active drainage Proximal nail fold/bed necrotic  Gram stain--polymicrobial  Assessment/Plan: Systemic symptoms improved, but hand not necessarily so. May need change in antibiotics--could ultimately need return to OR for repeat I&D including more proximal levels if improvement not demonstrated Light dressing on finger Labs today--CBC, ESR, CRP, HgA1C Strict elevation ID consult today   THOMPSON, DAVID A. 11/25/2012, 8:16 AM

## 2012-11-25 NOTE — Progress Notes (Signed)
ANTIBIOTIC CONSULT NOTE - INITIAL  Pharmacy Consult for Vancomycin and Zosyn  Indication: Right hand/finger infection  Allergies  Allergen Reactions  . Phenergan (Promethazine Hcl)     Does not know reaction  . Tramadol     Unknown    Patient Measurements:   Body Weight: 108 kg (05/2012)  Vital Signs: Temp: 99 F (37.2 C) (03/12 0815) BP: 136/69 mmHg (03/12 0815) Pulse Rate: 108 (03/12 0815) Intake/Output from previous day: 03/11 0701 - 03/12 0700 In: 560 [P.O.:560] Out: -  Intake/Output from this shift: Total I/O In: 240 [P.O.:240] Out: -   Labs:  Recent Labs  11/23/12 1729 11/23/12 2031 11/24/12 0602 11/25/12 0746  WBC 16.8* 17.0* 17.6* 17.3*  HGB 13.1 12.8 10.9* 10.5*  PLT 290 261 246 250  CREATININE 0.73 0.74  --   --    The CrCl is unknown because both a height and weight (above a minimum accepted value) are required for this calculation.  Recent Labs  11/25/12 0750  VANCOTROUGH 30.3*     Microbiology: Recent Results (from the past 720 hour(s))  CULTURE, BLOOD (ROUTINE X 2)     Status: None   Collection Time    11/23/12  8:35 PM      Result Value Range Status   Specimen Description BLOOD LEFT ARM   Final   Special Requests BOTTLES DRAWN AEROBIC ONLY 10CC   Final   Culture  Setup Time 11/24/2012 02:53   Final   Culture     Final   Value:        BLOOD CULTURE RECEIVED NO GROWTH TO DATE CULTURE WILL BE HELD FOR 5 DAYS BEFORE ISSUING A FINAL NEGATIVE REPORT   Report Status PENDING   Incomplete  CULTURE, BLOOD (ROUTINE X 2)     Status: None   Collection Time    11/23/12  8:40 PM      Result Value Range Status   Specimen Description BLOOD RIGHT ARM   Final   Special Requests BOTTLES DRAWN AEROBIC ONLY 10CC   Final   Culture  Setup Time 11/24/2012 02:53   Final   Culture     Final   Value:        BLOOD CULTURE RECEIVED NO GROWTH TO DATE CULTURE WILL BE HELD FOR 5 DAYS BEFORE ISSUING A FINAL NEGATIVE REPORT   Report Status PENDING   Incomplete   FUNGUS CULTURE W SMEAR     Status: None   Collection Time    11/24/12 12:45 AM      Result Value Range Status   Specimen Description ABSCESS   Final   Special Requests     Final   Value: RT LONG FINGER NAIL BED PATIENT ON FOLLOWING CLINDAMYCIN   Fungal Smear NO YEAST OR FUNGAL ELEMENTS SEEN   Final   Culture CULTURE IN PROGRESS FOR FOUR WEEKS   Final   Report Status PENDING   Incomplete  CULTURE, ROUTINE-ABSCESS     Status: None   Collection Time    11/24/12 12:45 AM      Result Value Range Status   Specimen Description ABSCESS   Final   Special Requests     Final   Value: RT LONG FINGER NAIL BED PATIENT ON FOLLOWING CLINDAMYCIN   Gram Stain PENDING   Incomplete   Culture Culture reincubated for better growth   Final   Report Status PENDING   Incomplete  ANAEROBIC CULTURE     Status: None   Collection Time  11/24/12 12:45 AM      Result Value Range Status   Specimen Description ABSCESS   Final   Special Requests     Final   Value: RT LONG FINGER NAIL BED PATIENT ON FOLLOWING CLINDAMYCIN   Gram Stain     Final   Value: NO WBC SEEN     NO SQUAMOUS EPITHELIAL CELLS SEEN     ABUNDANT GRAM NEGATIVE RODS     FEW GRAM POSITIVE RODS     FEW GRAM POSITIVE COCCI IN PAIRS   Culture     Final   Value: NO ANAEROBES ISOLATED; CULTURE IN PROGRESS FOR 5 DAYS   Report Status PENDING   Incomplete  CULTURE, ROUTINE-ABSCESS     Status: None   Collection Time    11/24/12 12:50 AM      Result Value Range Status   Specimen Description ABSCESS   Final   Special Requests RIGHT LONG FINGER PATIENT ON FOLLOWING CLINDAMYCIN   Final   Gram Stain     Final   Value: NO WBC SEEN     RARE SQUAMOUS EPITHELIAL CELLS PRESENT     RARE GRAM NEGATIVE RODS     RARE GRAM NEGATIVE COCCOBACILLI     RARE GRAM POSITIVE COCCI IN PAIRS   Culture Culture reincubated for better growth   Final   Report Status PENDING   Incomplete  ANAEROBIC CULTURE     Status: None   Collection Time    11/24/12 12:50 AM       Result Value Range Status   Specimen Description ABSCESS   Final   Special Requests     Final   Value: RIGHT LONGER FINGER PATIENT ON FOLLOWING CLINDAMYCIN   Gram Stain PENDING   Incomplete   Culture     Final   Value: NO ANAEROBES ISOLATED; CULTURE IN PROGRESS FOR 5 DAYS   Report Status PENDING   Incomplete  FUNGUS CULTURE W SMEAR     Status: None   Collection Time    11/24/12 12:50 AM      Result Value Range Status   Specimen Description ABSCESS   Final   Special Requests RIGHT LONG FINGER PATIENT ON FOLLOWING CLINDAMYCIN   Final   Fungal Smear NO YEAST OR FUNGAL ELEMENTS SEEN   Final   Culture CULTURE IN PROGRESS FOR FOUR WEEKS   Final   Report Status PENDING   Incomplete  URINE CULTURE     Status: None   Collection Time    11/24/12  9:46 AM      Result Value Range Status   Specimen Description URINE, CLEAN CATCH   Final   Special Requests NONE   Final   Culture  Setup Time 11/24/2012 10:29   Final   Colony Count PENDING   Incomplete   Culture Culture reincubated for better growth   Final   Report Status PENDING   Incomplete    Assessment: 37 yo female s/p I&D right hand and finger for empiric antibiotics.  Vancomycin trough reported as 30.3 this morning.  RN reports that the dose was being infused when the level was drawn so this is falsely elevated.  Goal of Therapy:  Vancomycin trough level 10-15 mcg/ml  Plan:  Continue vancomycin 1g IV q8h - recheck vancomycin trough tomorrow morning with a BMET. Zosyn 3.375 g IV q8h  Mickeal Skinner 11/25/2012,10:50 AM

## 2012-11-25 NOTE — Consult Note (Signed)
[facility]     Reason for Consult: finger infection    Referring Physician: Dr. Janee Morn  Active Problems:   Diabetes   . docusate sodium  100 mg Oral BID  . influenza  inactive virus vaccine  0.5 mL Intramuscular Tomorrow-1000  . insulin aspart  0-15 Units Subcutaneous TID WC  . insulin aspart protamine-insulin aspart  30 Units Subcutaneous Q supper  . insulin aspart protamine-insulin aspart  45 Units Subcutaneous Q breakfast  . piperacillin-tazobactam (ZOSYN)  IV  3.375 g Intravenous Q8H  . simvastatin  10 mg Oral q1800  . vancomycin  1,000 mg Intravenous Q8H  . vitamin C  1,000 mg Oral Daily    Recommendations: Continue vancomycin and zosyn for now, I do not feel she needs a change at this time Keflex (stopped on about the 9th) She will likely be able to go out on oral antibiotics, depending on culture, so no PICC at this time  Assessment: She developed a superficial finger infection with no deep tissue or bone involvement.  Her gram stain shows multiple organisms with GNR being predominate on one of the gram stains.  The cultures are growing.  With the history of starting at the nail salon, [employer], mycoplasma certainly possible (and appropriate cultures done)  Antibiotics: Vancomycin and zosyn day 2  HPI: [name] is a 37 y.o. female with diabetes who was in her usual state of heatlh prior to a nail salon visit and developed swelling and pain in her right middle finger.  She went to the ED and was started on keflex, but did not improve and went back to the ED on 3/6 and had it drained with a small amount of pus expressed.  She then represented to ED after seeing her PCP and was taken to OR by Dr. Charlesetta Shanks for infection.  No bone or joint involvement.  Did remove nail.  Patient has experienced fever and chills during this time.     Review of Systems: Pertinent items are noted in HPI.  Past Medical History  Diagnosis Date  . Diabetes  mellitus   . Urinary tract infection     hx of  . Diabetic neuropathy   . Arthritis   . Herpes virus disease   . Chronic back pain   . Hip fracture     left side  . Cataracts, bilateral   . Abscess 11/2012    RIGHT FINGER    History  Substance Use Topics  . Smoking status: Never Smoker   . Smokeless tobacco: Never Used  . Alcohol Use: No    History reviewed. No pertinent family history. Allergies  Allergen Reactions  . Phenergan (Promethazine Hcl)     Does not know reaction  . Tramadol     Unknown    OBJECTIVE: Blood pressure 136/69, pulse 108, temperature 99 F (37.2 C), temperature source Oral, resp. rate 18, last menstrual period 11/14/2012, SpO2 100.00%. General: Awake, alert, oriented x 3 Skin: no rashes Lungs: CTA B Cor: RRR without m/r/g Abdomen: soft, ntnd, obese, +bs Ext: right hand wrapped  Microbiology: Recent Results (from the past 240 hour(s))  CULTURE, BLOOD (ROUTINE X 2)     Status: None   Collection Time    11/23/12  8:35 PM      Result Value Range Status   Specimen Description BLOOD LEFT ARM   Final   Special Requests BOTTLES DRAWN AEROBIC ONLY 10CC   Final   Culture  Setup  Time 11/24/2012 02:53   Final   Culture     Final   Value:        BLOOD CULTURE RECEIVED NO GROWTH TO DATE CULTURE WILL BE HELD FOR 5 DAYS BEFORE ISSUING A FINAL NEGATIVE REPORT   Report Status PENDING   Incomplete  CULTURE, BLOOD (ROUTINE X 2)     Status: None   Collection Time    11/23/12  8:40 PM      Result Value Range Status   Specimen Description BLOOD RIGHT ARM   Final   Special Requests BOTTLES DRAWN AEROBIC ONLY 10CC   Final   Culture  Setup Time 11/24/2012 02:53   Final   Culture     Final   Value:        BLOOD CULTURE RECEIVED NO GROWTH TO DATE CULTURE WILL BE HELD FOR 5 DAYS BEFORE ISSUING A FINAL NEGATIVE REPORT   Report Status PENDING   Incomplete  FUNGUS CULTURE W SMEAR     Status: None   Collection Time    11/24/12 12:45 AM      Result Value Range  Status   Specimen Description ABSCESS   Final   Special Requests     Final   Value: RT LONG FINGER NAIL BED PATIENT ON FOLLOWING CLINDAMYCIN   Fungal Smear NO YEAST OR FUNGAL ELEMENTS SEEN   Final   Culture CULTURE IN PROGRESS FOR FOUR WEEKS   Final   Report Status PENDING   Incomplete  CULTURE, ROUTINE-ABSCESS     Status: None   Collection Time    11/24/12 12:45 AM      Result Value Range Status   Specimen Description ABSCESS   Final   Special Requests     Final   Value: RT LONG FINGER NAIL BED PATIENT ON FOLLOWING CLINDAMYCIN   Gram Stain PENDING   Incomplete   Culture Culture reincubated for better growth   Final   Report Status PENDING   Incomplete  ANAEROBIC CULTURE     Status: None   Collection Time    11/24/12 12:45 AM      Result Value Range Status   Specimen Description ABSCESS   Final   Special Requests     Final   Value: RT LONG FINGER NAIL BED PATIENT ON FOLLOWING CLINDAMYCIN   Gram Stain     Final   Value: NO WBC SEEN     NO SQUAMOUS EPITHELIAL CELLS SEEN     ABUNDANT GRAM NEGATIVE RODS     FEW GRAM POSITIVE RODS     FEW GRAM POSITIVE COCCI IN PAIRS   Culture     Final   Value: NO ANAEROBES ISOLATED; CULTURE IN PROGRESS FOR 5 DAYS   Report Status PENDING   Incomplete  CULTURE, ROUTINE-ABSCESS     Status: None   Collection Time    11/24/12 12:50 AM      Result Value Range Status   Specimen Description ABSCESS   Final   Special Requests RIGHT LONG FINGER PATIENT ON FOLLOWING CLINDAMYCIN   Final   Gram Stain     Final   Value: NO WBC SEEN     RARE SQUAMOUS EPITHELIAL CELLS PRESENT     RARE GRAM NEGATIVE RODS     RARE GRAM NEGATIVE COCCOBACILLI     RARE GRAM POSITIVE COCCI IN PAIRS   Culture Culture reincubated for better growth   Final   Report Status PENDING   Incomplete  ANAEROBIC CULTURE  Status: None   Collection Time    11/24/12 12:50 AM      Result Value Range Status   Specimen Description ABSCESS   Final   Special Requests     Final   Value:  RIGHT LONGER FINGER PATIENT ON FOLLOWING CLINDAMYCIN   Gram Stain PENDING   Incomplete   Culture     Final   Value: NO ANAEROBES ISOLATED; CULTURE IN PROGRESS FOR 5 DAYS   Report Status PENDING   Incomplete  FUNGUS CULTURE W SMEAR     Status: None   Collection Time    11/24/12 12:50 AM      Result Value Range Status   Specimen Description ABSCESS   Final   Special Requests RIGHT LONG FINGER PATIENT ON FOLLOWING CLINDAMYCIN   Final   Fungal Smear NO YEAST OR FUNGAL ELEMENTS SEEN   Final   Culture CULTURE IN PROGRESS FOR FOUR WEEKS   Final   Report Status PENDING   Incomplete  URINE CULTURE     Status: None   Collection Time    11/24/12  9:46 AM      Result Value Range Status   Specimen Description URINE, CLEAN CATCH   Final   Special Requests NONE   Final   Culture  Setup Time 11/24/2012 10:29   Final   Colony Count PENDING   Incomplete   Culture Culture reincubated for better growth   Final   Report Status PENDING   Incomplete    Staci Righter, MD [facility] [phone] pager  [phone] cell 11/25/2012, 11:05 AM

## 2012-11-25 NOTE — Progress Notes (Signed)
Patient requested to take her Humalog 70/30 at 8pm because her blood sugar was 96, and she had not eaten yet.

## 2012-11-26 LAB — GLUCOSE, CAPILLARY
Glucose-Capillary: 69 mg/dL — ABNORMAL LOW (ref 70–99)
Glucose-Capillary: 92 mg/dL (ref 70–99)
Glucose-Capillary: 100 mg/dL — ABNORMAL HIGH (ref 70–99)
Glucose-Capillary: 205 mg/dL — ABNORMAL HIGH (ref 70–99)
Glucose-Capillary: 170 mg/dL — ABNORMAL HIGH (ref 70–99)
Glucose-Capillary: 167 mg/dL — ABNORMAL HIGH (ref 70–99)

## 2012-11-26 LAB — BASIC METABOLIC PANEL
CO2: 28 mEq/L (ref 19–32)
Chloride: 97 mEq/L (ref 96–112)
Creatinine, Ser: 1.28 mg/dL — ABNORMAL HIGH (ref 0.50–1.10)
GFR calc Af Amer: 61 mL/min — ABNORMAL LOW (ref >90)
Potassium: 3.7 mEq/L (ref 3.5–5.1)

## 2012-11-26 LAB — CULTURE, ROUTINE-ABSCESS

## 2012-11-26 LAB — CBC
Hemoglobin: 9.9 g/dL — ABNORMAL LOW (ref 12.0–15.0)
MCHC: 34.4 g/dL (ref 30.0–36.0)
Platelets: 299 10*3/uL (ref 150–400)
RDW: 14.1 % (ref 11.5–15.5)

## 2012-11-26 LAB — VANCOMYCIN, TROUGH: Vancomycin Tr: 20.7 ug/mL — ABNORMAL HIGH (ref 10.0–20.0)

## 2012-11-26 LAB — URINE CULTURE

## 2012-11-26 MED ORDER — VANCOMYCIN HCL 1000 MG IV SOLR
750.0000 mg | Freq: Three times a day (TID) | INTRAVENOUS | Status: DC
  Filled 2012-11-26: qty 750

## 2012-11-26 MED ORDER — CEFTRIAXONE SODIUM 1 G IJ SOLR
1.0000 g | INTRAMUSCULAR | Status: DC
  Administered 2012-11-26: 1 g via INTRAVENOUS
  Filled 2012-11-26 (×3): qty 10

## 2012-11-26 MED ORDER — AMOXICILLIN 500 MG PO CAPS
500.0000 mg | ORAL_CAPSULE | Freq: Three times a day (TID) | ORAL | Status: DC
  Filled 2012-11-26 (×3): qty 1

## 2012-11-26 MED ORDER — VANCOMYCIN HCL IN DEXTROSE 1-5 GM/200ML-% IV SOLN
1000.0000 mg | Freq: Two times a day (BID) | INTRAVENOUS | Status: DC
  Filled 2012-11-26 (×2): qty 200

## 2012-11-26 NOTE — Progress Notes (Signed)
CMP cancelled because one had already been ordered for today. CBC being drawn at 1130.

## 2012-11-26 NOTE — Progress Notes (Signed)
Pt CBG 92, no s/s of hypoglycemia.  Will continue to monitor for status changes.

## 2012-11-26 NOTE — Progress Notes (Signed)
Subjective: POD 2 R LF I&D of abscess Systemic symptoms better, hand/FA swollen and painful, but edema may be a little decreased.  Objective: Vital signs in last 24 hours: Temp:  [97.9 F (36.6 C)-98.5 F (36.9 C)] 97.9 F (36.6 C) (03/13 0652) Pulse Rate:  [102-115] 102 (03/13 0652) Resp:  [20] 20 (03/13 0652) BP: (140-145)/(74-78) 145/78 mmHg (03/13 0652) SpO2:  [100 %] 100 % (03/13 0652)  Intake/Output from previous day: 03/12 0701 - 03/13 0700 In: 480 [P.O.:480] Out: -  Intake/Output this shift:     Recent Labs  11/23/12 1729 11/23/12 2031 11/24/12 0602 11/25/12 0746  HGB 13.1 12.8 10.9* 10.5*    Recent Labs  11/24/12 0602 11/25/12 0746  WBC 17.6* 17.3*  RBC 4.10 3.94  HCT 31.4* 30.2*  PLT 246 250    Recent Labs  11/23/12 1729 11/23/12 2031  NA 125* 127*  K 3.8 3.9  CL 89* 89*  CO2 25 24  BUN 10 10  CREATININE 0.73 0.74  GLUCOSE 328* 326*  CALCIUM 9.6 9.5   No results found for this basename: LABPT, INR,  in the last 72 hours  R LF and hand/distal forearm swollen, but seems less than y'day.  Fingers stiff, held in near full extension  No volar/palmar pain with passive full extension of LF--only dorsal pain with LF movement No active drainage Proximal nail fold/bed necrotic  Cxs--multiple organisms, no Staph Aureus or Strep A  Assessment/Plan: Systemic symptoms improved, hand seems to be turning the corner Infection medication management per ID--thank you. D/C dressing, patient may shower normally Labs today--CBC, and basic metabolic panel--if hyponatremic will c/s hostpitalist to direct patient's diabetic and medical management  THOMPSON, DAVID A. 11/26/2012, 8:45 AM

## 2012-11-26 NOTE — Progress Notes (Addendum)
Pt CBG 69, offered pt grape icee, no other beverage on unit to offer.  Will continue to monitor for status changes. Pt refused D50 stating "it drops my sugar too low".

## 2012-11-26 NOTE — Progress Notes (Addendum)
[facility]  Date of Admission:  11/23/2012  Antibiotics: Vancomycin and zosyn day 3  Subjective: Still with pain in finger  Objective: Temp:  [97.9 F (36.6 C)-99.1 F (37.3 C)] 99.1 F (37.3 C) (03/13 1403) Pulse Rate:  [102-103] 103 (03/13 1403) Resp:  [16-20] 16 (03/13 1403) BP: (145-154)/(76-78) 154/76 mmHg (03/13 1403) SpO2:  [100 %] 100 % (03/13 1403)  General: AAO x 3, nad Skin: no rashes Lungs: CTA B Finger with significant swellling throughout digit, 2 incisions areas on medial side  Lab Results Lab Results  Component Value Date   WBC 16.8* 11/26/2012   HGB 9.9* 11/26/2012   HCT 28.8* 11/26/2012   MCV 77.2* 11/26/2012   PLT 299 11/26/2012    Lab Results  Component Value Date   CREATININE 1.28* 11/26/2012   BUN 8 11/26/2012   NA 137 11/26/2012   K 3.7 11/26/2012   CL 97 11/26/2012   CO2 28 11/26/2012    Lab Results  Component Value Date   ALT 15 11/23/2012   AST 20 11/23/2012   ALKPHOS 85 11/23/2012   BILITOT 0.2* 11/23/2012      Microbiology: Recent Results (from the past 240 hour(s))  CULTURE, BLOOD (ROUTINE X 2)     Status: None   Collection Time    11/23/12  8:35 PM      Result Value Range Status   Specimen Description BLOOD LEFT ARM   Final   Special Requests BOTTLES DRAWN AEROBIC ONLY 10CC   Final   Culture  Setup Time 11/24/2012 02:53   Final   Culture     Final   Value:        BLOOD CULTURE RECEIVED NO GROWTH TO DATE CULTURE WILL BE HELD FOR 5 DAYS BEFORE ISSUING A FINAL NEGATIVE REPORT   Report Status PENDING   Incomplete  CULTURE, BLOOD (ROUTINE X 2)     Status: None   Collection Time    11/23/12  8:40 PM      Result Value Range Status   Specimen Description BLOOD RIGHT ARM   Final   Special Requests BOTTLES DRAWN AEROBIC ONLY 10CC   Final   Culture  Setup Time 11/24/2012 02:53   Final   Culture     Final   Value:        BLOOD CULTURE RECEIVED NO GROWTH TO DATE CULTURE WILL BE HELD FOR 5 DAYS BEFORE ISSUING A  FINAL NEGATIVE REPORT   Report Status PENDING   Incomplete  AFB CULTURE WITH SMEAR     Status: None   Collection Time    11/24/12 12:45 AM      Result Value Range Status   Specimen Description ABSCESS   Final   Special Requests     Final   Value: RT LONG FINGER NAIL BED PATIENT ON FOLLOWING CLINDAMYCIN   ACID FAST SMEAR NO ACID FAST BACILLI SEEN   Final   Culture     Final   Value: CULTURE WILL BE EXAMINED FOR 6 WEEKS BEFORE ISSUING A FINAL REPORT   Report Status PENDING   Incomplete  FUNGUS CULTURE W SMEAR     Status: None   Collection Time    11/24/12 12:45 AM      Result Value Range Status   Specimen Description ABSCESS   Final   Special Requests     Final   Value: RT LONG FINGER NAIL BED PATIENT ON FOLLOWING CLINDAMYCIN   Fungal Smear NO  YEAST OR FUNGAL ELEMENTS SEEN   Final   Culture CULTURE IN PROGRESS FOR FOUR WEEKS   Final   Report Status PENDING   Incomplete  CULTURE, ROUTINE-ABSCESS     Status: None   Collection Time    11/24/12 12:45 AM      Result Value Range Status   Specimen Description ABSCESS   Final   Special Requests     Final   Value: RT LONG FINGER NAIL BED PATIENT ON FOLLOWING CLINDAMYCIN   Gram Stain     Final   Value: NO WBC SEEN     NO SQUAMOUS EPITHELIAL CELLS SEEN     ABUNDANT GRAM NEGATIVE RODS     FEW GRAM POSITIVE RODS     FEW GRAM POSITIVE COCCI IN PAIRS   Culture     Final   Value: FEW GROUP B STREP(S.AGALACTIAE)ISOLATED     Note: TESTING AGAINST S. AGALACTIAE NOT ROUTINELY PERFORMED DUE TO PREDICTABILITY OF AMP/PEN/VAN SUSCEPTIBILITY.   Report Status 11/26/2012 FINAL   Final  ANAEROBIC CULTURE     Status: None   Collection Time    11/24/12 12:45 AM      Result Value Range Status   Specimen Description ABSCESS   Final   Special Requests     Final   Value: RT LONG FINGER NAIL BED PATIENT ON FOLLOWING CLINDAMYCIN   Gram Stain     Final   Value: NO WBC SEEN     NO SQUAMOUS EPITHELIAL CELLS SEEN     ABUNDANT GRAM NEGATIVE RODS     FEW GRAM  POSITIVE RODS     FEW GRAM POSITIVE COCCI IN PAIRS   Culture     Final   Value: NO ANAEROBES ISOLATED; CULTURE IN PROGRESS FOR 5 DAYS   Report Status PENDING   Incomplete  CULTURE, ROUTINE-ABSCESS     Status: None   Collection Time    11/24/12 12:50 AM      Result Value Range Status   Specimen Description ABSCESS   Final   Special Requests RIGHT LONG FINGER PATIENT ON FOLLOWING CLINDAMYCIN   Final   Gram Stain     Final   Value: NO WBC SEEN     RARE SQUAMOUS EPITHELIAL CELLS PRESENT     RARE GRAM NEGATIVE RODS     RARE GRAM NEGATIVE COCCOBACILLI     RARE GRAM POSITIVE COCCI IN PAIRS   Culture     Final   Value: MULTIPLE ORGANISMS PRESENT, NONE PREDOMINANT NO STAPHYLOCOCCUS AUREUS ISOLATED NO GROUP A STREP (S.PYOGENES) ISOLATED   Report Status 11/26/2012 FINAL   Final  ANAEROBIC CULTURE     Status: None   Collection Time    11/24/12 12:50 AM      Result Value Range Status   Specimen Description ABSCESS   Final   Special Requests     Final   Value: RIGHT LONGER FINGER PATIENT ON FOLLOWING CLINDAMYCIN   Gram Stain PENDING   Incomplete   Culture     Final   Value: NO ANAEROBES ISOLATED; CULTURE IN PROGRESS FOR 5 DAYS   Report Status PENDING   Incomplete  AFB CULTURE WITH SMEAR     Status: None   Collection Time    11/24/12 12:50 AM      Result Value Range Status   Specimen Description ABSCESS   Final   Special Requests RIGHT LONG FINGER PATIENT ON FOLLOWING CLINDAMYCIN   Final   ACID FAST SMEAR NO ACID FAST BACILLI  SEEN   Final   Culture     Final   Value: CULTURE WILL BE EXAMINED FOR 6 WEEKS BEFORE ISSUING A FINAL REPORT   Report Status PENDING   Incomplete  FUNGUS CULTURE W SMEAR     Status: None   Collection Time    11/24/12 12:50 AM      Result Value Range Status   Specimen Description ABSCESS   Final   Special Requests RIGHT LONG FINGER PATIENT ON FOLLOWING CLINDAMYCIN   Final   Fungal Smear NO YEAST OR FUNGAL ELEMENTS SEEN   Final   Culture CULTURE IN PROGRESS FOR  FOUR WEEKS   Final   Report Status PENDING   Incomplete  URINE CULTURE     Status: None   Collection Time    11/24/12  9:46 AM      Result Value Range Status   Specimen Description URINE, CLEAN CATCH   Final   Special Requests NONE   Final   Culture  Setup Time 11/24/2012 10:29   Final   Colony Count 20,OOO COLONIES/ML   Final   Culture     Final   Value: Multiple bacterial morphotypes present, none predominant. Suggest appropriate recollection if clinically indicated.   Report Status 11/26/2012 FINAL   Final    Studies/Results: No results found.  Assessment/Plan: 1)  Finger abscess - culture growing Group B Streptococcus.  Other culture with mixed bacteria.   -I will treat GBS and will use IV Rocephin for now in case she returns to OR -she can go home with amoxicillin po 500 mg tid when ready, no need for PICC  Staci Righter, MD [facility] [phone] pager   11/26/2012, 3:57 PM

## 2012-11-26 NOTE — Progress Notes (Signed)
ANTIBIOTIC CONSULT NOTE - INITIAL  Pharmacy Consult for Vancomycin and Zosyn  Indication: Right hand/finger infection  Allergies  Allergen Reactions  . Phenergan (Promethazine Hcl)     Does not know reaction  . Tramadol     Unknown    Patient Measurements:   Body Weight: 108 kg (05/2012)  Vital Signs: Temp: 97.9 F (36.6 C) (03/13 0652) BP: 145/78 mmHg (03/13 0652) Pulse Rate: 102 (03/13 0652) Intake/Output from previous day: 03/12 0701 - 03/13 0700 In: 480 [P.O.:480] Out: -  Intake/Output from this shift:    Labs:  Recent Labs  11/23/12 1729 11/23/12 2031 11/24/12 0602 11/25/12 0746 11/26/12 0815 11/26/12 1130  WBC 16.8* 17.0* 17.6* 17.3*  --  16.8*  HGB 13.1 12.8 10.9* 10.5*  --  9.9*  PLT 290 261 246 250  --  299  CREATININE 0.73 0.74  --   --  1.28*  --    The CrCl is unknown because both a height and weight (above a minimum accepted value) are required for this calculation.  Recent Labs  11/25/12 0750 11/26/12 0815  VANCOTROUGH 30.3* 20.7*     Microbiology: Recent Results (from the past 720 hour(s))  CULTURE, BLOOD (ROUTINE X 2)     Status: None   Collection Time    11/23/12  8:35 PM      Result Value Range Status   Specimen Description BLOOD LEFT ARM   Final   Special Requests BOTTLES DRAWN AEROBIC ONLY 10CC   Final   Culture  Setup Time 11/24/2012 02:53   Final   Culture     Final   Value:        BLOOD CULTURE RECEIVED NO GROWTH TO DATE CULTURE WILL BE HELD FOR 5 DAYS BEFORE ISSUING A FINAL NEGATIVE REPORT   Report Status PENDING   Incomplete  CULTURE, BLOOD (ROUTINE X 2)     Status: None   Collection Time    11/23/12  8:40 PM      Result Value Range Status   Specimen Description BLOOD RIGHT ARM   Final   Special Requests BOTTLES DRAWN AEROBIC ONLY 10CC   Final   Culture  Setup Time 11/24/2012 02:53   Final   Culture     Final   Value:        BLOOD CULTURE RECEIVED NO GROWTH TO DATE CULTURE WILL BE HELD FOR 5 DAYS BEFORE ISSUING A FINAL  NEGATIVE REPORT   Report Status PENDING   Incomplete  AFB CULTURE WITH SMEAR     Status: None   Collection Time    11/24/12 12:45 AM      Result Value Range Status   Specimen Description ABSCESS   Final   Special Requests     Final   Value: RT LONG FINGER NAIL BED PATIENT ON FOLLOWING CLINDAMYCIN   ACID FAST SMEAR NO ACID FAST BACILLI SEEN   Final   Culture     Final   Value: CULTURE WILL BE EXAMINED FOR 6 WEEKS BEFORE ISSUING A FINAL REPORT   Report Status PENDING   Incomplete  FUNGUS CULTURE W SMEAR     Status: None   Collection Time    11/24/12 12:45 AM      Result Value Range Status   Specimen Description ABSCESS   Final   Special Requests     Final   Value: RT LONG FINGER NAIL BED PATIENT ON FOLLOWING CLINDAMYCIN   Fungal Smear NO YEAST OR FUNGAL ELEMENTS SEEN  Final   Culture CULTURE IN PROGRESS FOR FOUR WEEKS   Final   Report Status PENDING   Incomplete  CULTURE, ROUTINE-ABSCESS     Status: None   Collection Time    11/24/12 12:45 AM      Result Value Range Status   Specimen Description ABSCESS   Final   Special Requests     Final   Value: RT LONG FINGER NAIL BED PATIENT ON FOLLOWING CLINDAMYCIN   Gram Stain PENDING   Incomplete   Culture Culture reincubated for better growth   Final   Report Status PENDING   Incomplete  ANAEROBIC CULTURE     Status: None   Collection Time    11/24/12 12:45 AM      Result Value Range Status   Specimen Description ABSCESS   Final   Special Requests     Final   Value: RT LONG FINGER NAIL BED PATIENT ON FOLLOWING CLINDAMYCIN   Gram Stain     Final   Value: NO WBC SEEN     NO SQUAMOUS EPITHELIAL CELLS SEEN     ABUNDANT GRAM NEGATIVE RODS     FEW GRAM POSITIVE RODS     FEW GRAM POSITIVE COCCI IN PAIRS   Culture     Final   Value: NO ANAEROBES ISOLATED; CULTURE IN PROGRESS FOR 5 DAYS   Report Status PENDING   Incomplete  CULTURE, ROUTINE-ABSCESS     Status: None   Collection Time    11/24/12 12:50 AM      Result Value Range  Status   Specimen Description ABSCESS   Final   Special Requests RIGHT LONG FINGER PATIENT ON FOLLOWING CLINDAMYCIN   Final   Gram Stain     Final   Value: NO WBC SEEN     RARE SQUAMOUS EPITHELIAL CELLS PRESENT     RARE GRAM NEGATIVE RODS     RARE GRAM NEGATIVE COCCOBACILLI     RARE GRAM POSITIVE COCCI IN PAIRS   Culture     Final   Value: MULTIPLE ORGANISMS PRESENT, NONE PREDOMINANT NO STAPHYLOCOCCUS AUREUS ISOLATED NO GROUP A STREP (S.PYOGENES) ISOLATED   Report Status 11/26/2012 FINAL   Final  ANAEROBIC CULTURE     Status: None   Collection Time    11/24/12 12:50 AM      Result Value Range Status   Specimen Description ABSCESS   Final   Special Requests     Final   Value: RIGHT LONGER FINGER PATIENT ON FOLLOWING CLINDAMYCIN   Gram Stain PENDING   Incomplete   Culture     Final   Value: NO ANAEROBES ISOLATED; CULTURE IN PROGRESS FOR 5 DAYS   Report Status PENDING   Incomplete  AFB CULTURE WITH SMEAR     Status: None   Collection Time    11/24/12 12:50 AM      Result Value Range Status   Specimen Description ABSCESS   Final   Special Requests RIGHT LONG FINGER PATIENT ON FOLLOWING CLINDAMYCIN   Final   ACID FAST SMEAR NO ACID FAST BACILLI SEEN   Final   Culture     Final   Value: CULTURE WILL BE EXAMINED FOR 6 WEEKS BEFORE ISSUING A FINAL REPORT   Report Status PENDING   Incomplete  FUNGUS CULTURE W SMEAR     Status: None   Collection Time    11/24/12 12:50 AM      Result Value Range Status   Specimen Description ABSCESS  Final   Special Requests RIGHT LONG FINGER PATIENT ON FOLLOWING CLINDAMYCIN   Final   Fungal Smear NO YEAST OR FUNGAL ELEMENTS SEEN   Final   Culture CULTURE IN PROGRESS FOR FOUR WEEKS   Final   Report Status PENDING   Incomplete  URINE CULTURE     Status: None   Collection Time    11/24/12  9:46 AM      Result Value Range Status   Specimen Description URINE, CLEAN CATCH   Final   Special Requests NONE   Final   Culture  Setup Time 11/24/2012 10:29    Final   Colony Count 20,OOO COLONIES/ML   Final   Culture     Final   Value: Multiple bacterial morphotypes present, none predominant. Suggest appropriate recollection if clinically indicated.   Report Status 11/26/2012 FINAL   Final    Assessment: 37 yo female s/p I&D right hand and finger for empiric antibiotics.  Vancomycin trough reported as 20.7 this morning on the dose of 1g IV q8h.  Level is above goal of 10-15mg /L.  Creatinine up to 1.28 today from baseline of 0.74.   Goal of Therapy:  Vancomycin trough level 10-15 mcg/ml  Plan:  Change Vancomycin to 1g IV q12. Zosyn 3.375 g IV q8h  Mickeal Skinner 11/26/2012,1:21 PM

## 2012-11-26 NOTE — Progress Notes (Signed)
Inpatient Diabetes Program Recommendations  AACE/ADA: New Consensus Statement on Inpatient Glycemic Control (2013)  Target Ranges:  Prepandial:   less than 140 mg/dL      Peak postprandial:   less than 180 mg/dL (1-2 hours)      Critically ill patients:  140 - 180 mg/dL    Patient admitted with cellulitis.  A1c 11.3% (11/25/12).  Patient sees Dr. Concepcion Elk and Dr. Margaretmary Bayley for diabetes management as an outpatient.  Discussed A1C results with patient and explained what an A1C is.  Reminded patient that her goal A1c is 7% or less per ADA guidelines and per her PCP.  Patient told me that she is trying to work closely with her physicians to improve her CBG control.  Discussed with patient the fact that her infection may have an impact on her A1c results.  Gave patient educational pamphlet on A1c and her results as well.  Encouraged patient to check her CBGs at least tid at home and to take her CBG data to her PCP at every visit.  RNs to provide ongoing basic DM education at bedside with this patient.    Will follow. Ambrose Finland RN, MSN, CDE Diabetes Coordinator Inpatient Diabetes Program [phone]

## 2012-11-27 LAB — GLUCOSE, CAPILLARY
Glucose-Capillary: 144 mg/dL — ABNORMAL HIGH (ref 70–99)
Glucose-Capillary: 154 mg/dL — ABNORMAL HIGH (ref 70–99)

## 2012-11-27 MED ORDER — AMOXICILLIN-POT CLAVULANATE 500-125 MG PO TABS: 1.0000 | ORAL_TABLET | Freq: Three times a day (TID) | ORAL | Status: DC

## 2012-11-27 MED ORDER — AMOXICILLIN 500 MG PO CAPS
500.0000 mg | ORAL_CAPSULE | Freq: Three times a day (TID) | ORAL | Status: DC
  Administered 2012-11-27: 500 mg via ORAL
  Filled 2012-11-27 (×3): qty 1

## 2012-11-27 NOTE — Discharge Summary (Signed)
Physician Discharge Summary  Patient ID: [name] MRN: [id number] DOB/AGE: [birth year] 37 y.o.  Admit date: 11/23/2012 Discharge date: 11/27/2012  Admission Diagnoses:  Right hand infection  Discharge Diagnoses:  Active Problems:   Diabetes right hand infection  Past Medical History  Diagnosis Date  . Diabetes mellitus   . Urinary tract infection     hx of  . Diabetic neuropathy   . Arthritis   . Herpes virus disease   . Chronic back pain   . Hip fracture     left side  . Cataracts, bilateral   . Abscess 11/2012    RIGHT FINGER    Surgeries: Procedure(s): IRRIGATION AND DEBRIDEMENT EXTREMITY on 11/23/2012 - 11/24/2012   Consultants (if any):    Discharged Condition: Improved  Hospital Course: [name] is an 37 y.o. female who was admitted 11/23/2012 with a diagnosis of right hand infection and went to the operating room on 11/23/2012 - 11/24/2012 and underwent the above named procedures.  She improved on IV antibiotics, and based upon cxs, ID recs were for d/c on Augmentin 500 TID.  On day of D/C, patient was clinically improving, with decreasing edema, increasing ROM, no signs of flexor tenosynovitis.  She was given perioperative antibiotics:  Anti-infectives   Start     Dose/Rate Route Frequency Ordered Stop   11/26/12 2000  vancomycin (VANCOCIN) IVPB 1000 mg/200 mL premix  Status:  Discontinued     1,000 mg 200 mL/hr over 60 Minutes Intravenous Every 12 hours 11/26/12 1323 11/26/12 1503   11/26/12 1700  cefTRIAXone (ROCEPHIN) 1 g in dextrose 5 % 50 mL IVPB     1 g 100 mL/hr over 30 Minutes Intravenous Every 24 hours 11/26/12 1519     11/26/12 1600  vancomycin (VANCOCIN) 750 mg in sodium chloride 0.9 % 150 mL IVPB  Status:  Discontinued     750 mg 150 mL/hr over 60 Minutes Intravenous Every 8 hours 11/26/12 1320 11/26/12 1323   11/26/12 1600  amoxicillin (AMOXIL) capsule 500 mg  Status:  Discontinued     500 mg Oral 3 times per day 11/26/12 1504  11/26/12 1519   11/24/12 0800  vancomycin (VANCOCIN) IVPB 1000 mg/200 mL premix  Status:  Discontinued     1,000 mg 200 mL/hr over 60 Minutes Intravenous Every 8 hours 11/24/12 0154 11/26/12 1320   11/24/12 0200  vancomycin (VANCOCIN) 1,500 mg in sodium chloride 0.9 % 500 mL IVPB     1,500 mg 250 mL/hr over 120 Minutes Intravenous  Once 11/24/12 0154 11/24/12 0552   11/24/12 0200  piperacillin-tazobactam (ZOSYN) IVPB 3.375 g  Status:  Discontinued     3.375 g 12.5 mL/hr over 240 Minutes Intravenous Every 8 hours 11/24/12 0154 11/26/12 1503   11/23/12 2030  clindamycin (CLEOCIN) IVPB 600 mg     600 mg 100 mL/hr over 30 Minutes Intravenous  Once 11/23/12 2018 11/23/12 2130    .  She was given sequential compression devices, early ambulation,  for DVT prophylaxis.  She benefited maximally from the hospital stay and there were no complications.    Recent vital signs:  Filed Vitals:   11/27/12 0445  BP: 128/65  Pulse: 93  Temp: 98.1 F (36.7 C)  Resp: 18    Recent laboratory studies:  Lab Results  Component Value Date   HGB 9.9* 11/26/2012   HGB 10.5* 11/25/2012   HGB 10.9* 11/24/2012   Lab Results  Component Value Date   WBC 16.8* 11/26/2012   PLT  299 11/26/2012   No results found for this basename: INR   Lab Results  Component Value Date   NA 137 11/26/2012   K 3.7 11/26/2012   CL 97 11/26/2012   CO2 28 11/26/2012   BUN 8 11/26/2012   CREATININE 1.28* 11/26/2012   GLUCOSE 114* 11/26/2012    Discharge Medications:     Medication List    TAKE these medications       ascorbic acid 1000 MG tablet  Commonly known as:  VITAMIN C  Take 1 tablet (1,000 mg total) by mouth daily.     cyclobenzaprine 10 MG tablet  Commonly known as:  FLEXERIL  Take 10 mg by mouth 3 (three) times daily as needed for muscle spasms.     DSS 100 MG Caps  Take 100 mg by mouth 2 (two) times daily.     insulin NPH-insulin regular (70-30) 100 UNIT/ML injection  Commonly known as:  NOVOLIN 70/30   Inject 30-45 Units into the skin 2 (two) times daily with a meal. 45 units in am,  30 units pm     oxyCODONE-acetaminophen 5-325 MG per tablet  Commonly known as:  PERCOCET/ROXICET  Take 1-2 tablets by mouth every 4 (four) hours as needed for pain.     simvastatin 10 MG tablet  Commonly known as:  ZOCOR  Take 10 mg by mouth at bedtime.        Diagnostic Studies: Dg Chest 2 View  11/24/2012  *RADIOLOGY REPORT*  Clinical Data: Shortness of breath  CHEST - 2 VIEW  Comparison: 07/11/2012  Findings: There is an overlying artifact which obscures detailed evaluation. Allowing for this, lungs are clear. No pleural effusion or pneumothorax. The cardiomediastinal contours are within normal limits. The visualized bones and soft tissues are without significant appreciable abnormality.  IMPRESSION: No radiographic evidence of acute cardiopulmonary process.   Original Report Authenticated By: Jearld Lesch, M.D.     Disposition: 01-Home or Self Care        Follow-up Information   Schedule an appointment as soon as possible for a visit with Janee Morn, DAVID A., MD. (Call today to make follow-up appt for Monday)    Contact information:   [address] [place] [address] [phone]        Signed: Janee Morn, DAVID A. 11/27/2012, 6:59 AM

## 2012-11-27 NOTE — Progress Notes (Signed)
[facility]  Date of Admission:  11/23/2012  Antibiotics: Vancomycin and zosyn day 3 Amoxicillin day 1  Subjective: improved  Objective: Temp:  [98.1 F (36.7 C)-99.1 F (37.3 C)] 98.1 F (36.7 C) (03/14 0445) Pulse Rate:  [93-106] 93 (03/14 0445) Resp:  [16-18] 18 (03/14 0445) BP: (128-154)/(61-76) 128/65 mmHg (03/14 0445) SpO2:  [100 %] 100 % (03/14 0445)  General: AAO x 3, nad Skin: no rashes Lungs: CTA B Finger with significant swellling throughout digit, 2 incisions areas on medial side  Lab Results Lab Results  Component Value Date   WBC 16.8* 11/26/2012   HGB 9.9* 11/26/2012   HCT 28.8* 11/26/2012   MCV 77.2* 11/26/2012   PLT 299 11/26/2012    Lab Results  Component Value Date   CREATININE 1.28* 11/26/2012   BUN 8 11/26/2012   NA 137 11/26/2012   K 3.7 11/26/2012   CL 97 11/26/2012   CO2 28 11/26/2012    Lab Results  Component Value Date   ALT 15 11/23/2012   AST 20 11/23/2012   ALKPHOS 85 11/23/2012   BILITOT 0.2* 11/23/2012      Microbiology: Recent Results (from the past 240 hour(s))  CULTURE, BLOOD (ROUTINE X 2)     Status: None   Collection Time    11/23/12  8:35 PM      Result Value Range Status   Specimen Description BLOOD LEFT ARM   Final   Special Requests BOTTLES DRAWN AEROBIC ONLY 10CC   Final   Culture  Setup Time 11/24/2012 02:53   Final   Culture     Final   Value:        BLOOD CULTURE RECEIVED NO GROWTH TO DATE CULTURE WILL BE HELD FOR 5 DAYS BEFORE ISSUING A FINAL NEGATIVE REPORT   Report Status PENDING   Incomplete  CULTURE, BLOOD (ROUTINE X 2)     Status: None   Collection Time    11/23/12  8:40 PM      Result Value Range Status   Specimen Description BLOOD RIGHT ARM   Final   Special Requests BOTTLES DRAWN AEROBIC ONLY 10CC   Final   Culture  Setup Time 11/24/2012 02:53   Final   Culture     Final   Value:        BLOOD CULTURE RECEIVED NO GROWTH TO DATE CULTURE WILL BE HELD FOR 5 DAYS BEFORE ISSUING A  FINAL NEGATIVE REPORT   Report Status PENDING   Incomplete  AFB CULTURE WITH SMEAR     Status: None   Collection Time    11/24/12 12:45 AM      Result Value Range Status   Specimen Description ABSCESS   Final   Special Requests     Final   Value: RT LONG FINGER NAIL BED PATIENT ON FOLLOWING CLINDAMYCIN   ACID FAST SMEAR NO ACID FAST BACILLI SEEN   Final   Culture     Final   Value: CULTURE WILL BE EXAMINED FOR 6 WEEKS BEFORE ISSUING A FINAL REPORT   Report Status PENDING   Incomplete  FUNGUS CULTURE W SMEAR     Status: None   Collection Time    11/24/12 12:45 AM      Result Value Range Status   Specimen Description ABSCESS   Final   Special Requests     Final   Value: RT LONG FINGER NAIL BED PATIENT ON FOLLOWING CLINDAMYCIN   Fungal Smear NO YEAST  OR FUNGAL ELEMENTS SEEN   Final   Culture CULTURE IN PROGRESS FOR FOUR WEEKS   Final   Report Status PENDING   Incomplete  CULTURE, ROUTINE-ABSCESS     Status: None   Collection Time    11/24/12 12:45 AM      Result Value Range Status   Specimen Description ABSCESS   Final   Special Requests     Final   Value: RT LONG FINGER NAIL BED PATIENT ON FOLLOWING CLINDAMYCIN   Gram Stain     Final   Value: NO WBC SEEN     NO SQUAMOUS EPITHELIAL CELLS SEEN     ABUNDANT GRAM NEGATIVE RODS     FEW GRAM POSITIVE RODS     FEW GRAM POSITIVE COCCI IN PAIRS   Culture     Final   Value: FEW GROUP B STREP(S.AGALACTIAE)ISOLATED     Note: TESTING AGAINST S. AGALACTIAE NOT ROUTINELY PERFORMED DUE TO PREDICTABILITY OF AMP/PEN/VAN SUSCEPTIBILITY.   Report Status 11/26/2012 FINAL   Final  ANAEROBIC CULTURE     Status: None   Collection Time    11/24/12 12:45 AM      Result Value Range Status   Specimen Description ABSCESS   Final   Special Requests     Final   Value: RT LONG FINGER NAIL BED PATIENT ON FOLLOWING CLINDAMYCIN   Gram Stain     Final   Value: NO WBC SEEN     NO SQUAMOUS EPITHELIAL CELLS SEEN     ABUNDANT GRAM NEGATIVE RODS     FEW GRAM  POSITIVE RODS     FEW GRAM POSITIVE COCCI IN PAIRS   Culture     Final   Value: NO ANAEROBES ISOLATED; CULTURE IN PROGRESS FOR 5 DAYS   Report Status PENDING   Incomplete  CULTURE, ROUTINE-ABSCESS     Status: None   Collection Time    11/24/12 12:50 AM      Result Value Range Status   Specimen Description ABSCESS   Final   Special Requests RIGHT LONG FINGER PATIENT ON FOLLOWING CLINDAMYCIN   Final   Gram Stain     Final   Value: NO WBC SEEN     RARE SQUAMOUS EPITHELIAL CELLS PRESENT     RARE GRAM NEGATIVE RODS     RARE GRAM NEGATIVE COCCOBACILLI     RARE GRAM POSITIVE COCCI IN PAIRS   Culture     Final   Value: MULTIPLE ORGANISMS PRESENT, NONE PREDOMINANT NO STAPHYLOCOCCUS AUREUS ISOLATED NO GROUP A STREP (S.PYOGENES) ISOLATED   Report Status 11/26/2012 FINAL   Final  ANAEROBIC CULTURE     Status: None   Collection Time    11/24/12 12:50 AM      Result Value Range Status   Specimen Description ABSCESS   Final   Special Requests     Final   Value: RIGHT LONGER FINGER PATIENT ON FOLLOWING CLINDAMYCIN   Gram Stain PENDING   Incomplete   Culture     Final   Value: NO ANAEROBES ISOLATED; CULTURE IN PROGRESS FOR 5 DAYS   Report Status PENDING   Incomplete  AFB CULTURE WITH SMEAR     Status: None   Collection Time    11/24/12 12:50 AM      Result Value Range Status   Specimen Description ABSCESS   Final   Special Requests RIGHT LONG FINGER PATIENT ON FOLLOWING CLINDAMYCIN   Final   ACID FAST SMEAR NO ACID FAST BACILLI SEEN  Final   Culture     Final   Value: CULTURE WILL BE EXAMINED FOR 6 WEEKS BEFORE ISSUING A FINAL REPORT   Report Status PENDING   Incomplete  FUNGUS CULTURE W SMEAR     Status: None   Collection Time    11/24/12 12:50 AM      Result Value Range Status   Specimen Description ABSCESS   Final   Special Requests RIGHT LONG FINGER PATIENT ON FOLLOWING CLINDAMYCIN   Final   Fungal Smear NO YEAST OR FUNGAL ELEMENTS SEEN   Final   Culture CULTURE IN PROGRESS FOR  FOUR WEEKS   Final   Report Status PENDING   Incomplete  URINE CULTURE     Status: None   Collection Time    11/24/12  9:46 AM      Result Value Range Status   Specimen Description URINE, CLEAN CATCH   Final   Special Requests NONE   Final   Culture  Setup Time 11/24/2012 10:29   Final   Colony Count 20,OOO COLONIES/ML   Final   Culture     Final   Value: Multiple bacterial morphotypes present, none predominant. Suggest appropriate recollection if clinically indicated.   Report Status 11/26/2012 FINAL   Final    Studies/Results: No results found.  Assessment/Plan: 1)  Finger abscess - culture growing Group B Streptococcus.  Other culture with mixed bacteria.   -she should get 2 weeks of amoxicillin 500 mg tid and reassess during that time if a prolonged course needed -we are available for follow up if needed  Staci Righter, MD [facility] [phone] pager   11/27/2012, 11:56 AM

## 2012-11-29 LAB — ANAEROBIC CULTURE

## 2012-11-30 ENCOUNTER — Encounter (HOSPITAL_COMMUNITY): Payer: Self-pay | Admitting: Certified Registered Nurse Anesthetist

## 2012-11-30 ENCOUNTER — Encounter (HOSPITAL_COMMUNITY): Payer: Self-pay | Admitting: Anesthesiology

## 2012-11-30 ENCOUNTER — Inpatient Hospital Stay (HOSPITAL_COMMUNITY): Payer: Medicaid Other | Admitting: Anesthesiology

## 2012-11-30 ENCOUNTER — Other Ambulatory Visit: Payer: Self-pay | Admitting: Orthopedic Surgery

## 2012-11-30 ENCOUNTER — Inpatient Hospital Stay (HOSPITAL_COMMUNITY)
Admission: EM | Admit: 2012-11-30 | Discharge: 2012-12-04 | DRG: 580 | Disposition: A | Discharge status: Home Health | Payer: Medicaid Other | Source: Other Acute Inpatient Hospital | Attending: Orthopedic Surgery | Admitting: Orthopedic Surgery

## 2012-11-30 ENCOUNTER — Encounter (HOSPITAL_COMMUNITY): Admission: EM | Disposition: A | Discharge status: Home Health | Payer: Self-pay | Attending: Orthopedic Surgery

## 2012-11-30 DIAGNOSIS — Z794 Long term (current) use of insulin: Secondary | ICD-10-CM

## 2012-11-30 DIAGNOSIS — M129 Arthropathy, unspecified: Secondary | ICD-10-CM | POA: Diagnosis present

## 2012-11-30 DIAGNOSIS — IMO0002 Reserved for concepts with insufficient information to code with codable children: Secondary | ICD-10-CM

## 2012-11-30 DIAGNOSIS — M549 Dorsalgia, unspecified: Secondary | ICD-10-CM | POA: Diagnosis present

## 2012-11-30 DIAGNOSIS — Z6841 Body Mass Index (BMI) 40.0 and over, adult: Secondary | ICD-10-CM

## 2012-11-30 DIAGNOSIS — E1149 Type 2 diabetes mellitus with other diabetic neurological complication: Secondary | ICD-10-CM | POA: Diagnosis present

## 2012-11-30 DIAGNOSIS — E1142 Type 2 diabetes mellitus with diabetic polyneuropathy: Secondary | ICD-10-CM | POA: Diagnosis present

## 2012-11-30 DIAGNOSIS — E669 Obesity, unspecified: Secondary | ICD-10-CM | POA: Diagnosis present

## 2012-11-30 DIAGNOSIS — L02519 Cutaneous abscess of unspecified hand: Principal | ICD-10-CM | POA: Diagnosis present

## 2012-11-30 DIAGNOSIS — G8929 Other chronic pain: Secondary | ICD-10-CM | POA: Diagnosis present

## 2012-11-30 DIAGNOSIS — L03019 Cellulitis of unspecified finger: Principal | ICD-10-CM | POA: Diagnosis present

## 2012-11-30 HISTORY — PX: I & D EXTREMITY: SHX5045

## 2012-11-30 LAB — BASIC METABOLIC PANEL
BUN: 12 mg/dL (ref 6–23)
Calcium: 9.6 mg/dL (ref 8.4–10.5)
Chloride: 97 mEq/L (ref 96–112)
Creatinine, Ser: 1.59 mg/dL — ABNORMAL HIGH (ref 0.50–1.10)
GFR calc Af Amer: 47 mL/min — ABNORMAL LOW (ref >90)

## 2012-11-30 LAB — ANAEROBIC CULTURE

## 2012-11-30 LAB — CBC WITH DIFFERENTIAL/PLATELET
Basophils Relative: 0 % (ref 0–1)
Eosinophils Relative: 1 % (ref 0–5)
HCT: 28.8 % — ABNORMAL LOW (ref 36.0–46.0)
Hemoglobin: 9.8 g/dL — ABNORMAL LOW (ref 12.0–15.0)
MCH: 27.1 pg (ref 26.0–34.0)
MCHC: 34 g/dL (ref 30.0–36.0)
MCV: 79.8 fL (ref 78.0–100.0)
Monocytes Absolute: 1 10*3/uL (ref 0.1–1.0)
Monocytes Relative: 7 % (ref 3–12)
Neutro Abs: 10.5 10*3/uL — ABNORMAL HIGH (ref 1.7–7.7)
RDW: 14.2 % (ref 11.5–15.5)

## 2012-11-30 LAB — CULTURE, BLOOD (ROUTINE X 2): Culture: NO GROWTH

## 2012-11-30 SURGERY — IRRIGATION AND DEBRIDEMENT EXTREMITY
Anesthesia: General | Site: Hand | Laterality: Right | Wound class: Dirty or Infected

## 2012-11-30 MED ORDER — DEXTROSE 5 % IV SOLN
2.0000 g | INTRAVENOUS | Status: DC
  Administered 2012-12-01 – 2012-12-03 (×3): 2 g via INTRAVENOUS
  Filled 2012-11-30 (×5): qty 2

## 2012-11-30 MED ORDER — SODIUM CHLORIDE 0.9 % IR SOLN
Status: DC | PRN
  Administered 2012-11-30: 1000 mL

## 2012-11-30 MED ORDER — MIDAZOLAM HCL 5 MG/5ML IJ SOLN
INTRAMUSCULAR | Status: DC | PRN
  Administered 2012-11-30: 1 mg via INTRAVENOUS

## 2012-11-30 MED ORDER — VITAMIN C 500 MG PO TABS
1000.0000 mg | ORAL_TABLET | Freq: Every day | ORAL | Status: DC
  Administered 2012-12-01 – 2012-12-04 (×4): 1000 mg via ORAL
  Filled 2012-11-30 (×5): qty 2

## 2012-11-30 MED ORDER — HYDROMORPHONE HCL PF 1 MG/ML IJ SOLN
0.2500 mg | INTRAMUSCULAR | Status: DC | PRN
Start: 2012-11-30 — End: 2012-12-01
  Administered 2012-11-30 (×4): 0.5 mg via INTRAVENOUS

## 2012-11-30 MED ORDER — MAGNESIUM HYDROXIDE 400 MG/5ML PO SUSP: 30.0000 mL | Freq: Every day | ORAL | Status: DC | PRN

## 2012-11-30 MED ORDER — ONDANSETRON HCL 4 MG/2ML IJ SOLN
INTRAMUSCULAR | Status: DC | PRN
  Administered 2012-11-30: 4 mg via INTRAVENOUS

## 2012-11-30 MED ORDER — LIDOCAINE HCL (CARDIAC) 20 MG/ML IV SOLN
INTRAVENOUS | Status: DC | PRN
  Administered 2012-11-30: 80 mg via INTRAVENOUS

## 2012-11-30 MED ORDER — DEXTROSE 5 % IV SOLN
2.0000 g | INTRAVENOUS | Status: DC | PRN
  Administered 2012-11-30: 2 g via INTRAVENOUS

## 2012-11-30 MED ORDER — PROPOFOL 10 MG/ML IV BOLUS
INTRAVENOUS | Status: DC | PRN
  Administered 2012-11-30: 200 mg via INTRAVENOUS

## 2012-11-30 MED ORDER — ADULT MULTIVITAMIN W/MINERALS CH
1.0000 | ORAL_TABLET | Freq: Every day | ORAL | Status: DC
  Administered 2012-12-01 – 2012-12-04 (×4): 1 via ORAL
  Filled 2012-11-30 (×5): qty 1

## 2012-11-30 MED ORDER — OXYCODONE-ACETAMINOPHEN 5-325 MG PO TABS
1.0000 | ORAL_TABLET | ORAL | Status: DC | PRN
  Administered 2012-12-01 – 2012-12-04 (×11): 2 via ORAL
  Filled 2012-11-30 (×11): qty 2

## 2012-11-30 MED ORDER — FENTANYL CITRATE 0.05 MG/ML IJ SOLN
INTRAMUSCULAR | Status: DC | PRN
  Administered 2012-11-30 (×7): 50 ug via INTRAVENOUS

## 2012-11-30 MED ORDER — OXYCODONE HCL 5 MG PO TABS: 5.0000 mg | ORAL_TABLET | Freq: Once | ORAL | Status: AC | PRN

## 2012-11-30 MED ORDER — HYDROCODONE-ACETAMINOPHEN 5-325 MG PO TABS
1.0000 | ORAL_TABLET | ORAL | Status: DC | PRN
  Administered 2012-12-04: 2 via ORAL
  Filled 2012-11-30: qty 2

## 2012-11-30 MED ORDER — DIPHENHYDRAMINE HCL 25 MG PO CAPS: 25.0000 mg | ORAL_CAPSULE | Freq: Four times a day (QID) | ORAL | Status: DC | PRN

## 2012-11-30 MED ORDER — SUCCINYLCHOLINE CHLORIDE 20 MG/ML IJ SOLN
INTRAMUSCULAR | Status: DC | PRN
  Administered 2012-11-30: 130 mg via INTRAVENOUS

## 2012-11-30 MED ORDER — OXYCODONE HCL 5 MG/5ML PO SOLN: 5.0000 mg | Freq: Once | ORAL | Status: AC | PRN

## 2012-11-30 MED ORDER — HYDROMORPHONE HCL PF 1 MG/ML IJ SOLN
0.5000 mg | INTRAMUSCULAR | Status: DC | PRN
  Administered 2012-12-01 – 2012-12-03 (×11): 1 mg via INTRAVENOUS
  Filled 2012-11-30 (×11): qty 1

## 2012-11-30 MED ORDER — INSULIN ASPART 100 UNIT/ML ~~LOC~~ SOLN
0.0000 [IU] | Freq: Three times a day (TID) | SUBCUTANEOUS | Status: DC
  Administered 2012-12-01: 7 [IU] via SUBCUTANEOUS
  Administered 2012-12-03: 4 [IU] via SUBCUTANEOUS
  Administered 2012-12-04: 3 [IU] via SUBCUTANEOUS
  Administered 2012-12-04: 7 [IU] via SUBCUTANEOUS

## 2012-11-30 MED ORDER — ONDANSETRON HCL 4 MG/2ML IJ SOLN: 4.0000 mg | Freq: Once | INTRAMUSCULAR | Status: AC | PRN

## 2012-11-30 MED ORDER — DEXTROSE 5 % IV SOLN
2.0000 g | INTRAVENOUS | Status: DC
  Filled 2012-11-30: qty 2

## 2012-11-30 MED ORDER — SODIUM CHLORIDE 0.9 % IV SOLN
INTRAVENOUS | Status: DC | PRN
  Administered 2012-11-30: 22:00:00 via INTRAVENOUS

## 2012-11-30 MED ORDER — DOCUSATE SODIUM 100 MG PO CAPS
100.0000 mg | ORAL_CAPSULE | Freq: Two times a day (BID) | ORAL | Status: DC
  Administered 2012-12-01 – 2012-12-04 (×7): 100 mg via ORAL
  Filled 2012-11-30 (×9): qty 1

## 2012-11-30 MED ORDER — SODIUM CHLORIDE 0.9 % IV SOLN: INTRAVENOUS | Status: DC

## 2012-11-30 SURGICAL SUPPLY — 48 items
BANDAGE ELASTIC 3 VELCRO ST LF (GAUZE/BANDAGES/DRESSINGS) IMPLANT
BANDAGE ELASTIC 4 VELCRO ST LF (GAUZE/BANDAGES/DRESSINGS) ×2 IMPLANT
BANDAGE GAUZE ELAST BULKY 4 IN (GAUZE/BANDAGES/DRESSINGS) ×2 IMPLANT
BNDG CMPR 9X4 STRL LF SNTH (GAUZE/BANDAGES/DRESSINGS)
BNDG COHESIVE 3X5 TAN STRL LF (GAUZE/BANDAGES/DRESSINGS) ×1 IMPLANT
BNDG COHESIVE 4X5 TAN STRL (GAUZE/BANDAGES/DRESSINGS) IMPLANT
BNDG ESMARK 4X9 LF (GAUZE/BANDAGES/DRESSINGS) IMPLANT
CHLORAPREP W/TINT 26ML (MISCELLANEOUS) ×2 IMPLANT
CLOTH BEACON ORANGE TIMEOUT ST (SAFETY) ×2 IMPLANT
COVER SURGICAL LIGHT HANDLE (MISCELLANEOUS) ×2 IMPLANT
CUFF TOURNIQUET SINGLE 18IN (TOURNIQUET CUFF) ×1 IMPLANT
CUFF TOURNIQUET SINGLE 24IN (TOURNIQUET CUFF) ×1 IMPLANT
DRAIN PENROSE 1/4X12 LTX STRL (WOUND CARE) ×1 IMPLANT
DRAPE SURG 17X23 STRL (DRAPES) ×2 IMPLANT
DRSG ADAPTIC 3X8 NADH LF (GAUZE/BANDAGES/DRESSINGS) ×2 IMPLANT
ELECT REM PT RETURN 9FT ADLT (ELECTROSURGICAL) ×2
ELECTRODE REM PT RTRN 9FT ADLT (ELECTROSURGICAL) IMPLANT
EVACUATOR 1/8 PVC DRAIN (DRAIN) IMPLANT
GLOVE BIO SURGEON STRL SZ7.5 (GLOVE) ×2 IMPLANT
GLOVE BIOGEL PI IND STRL 8 (GLOVE) ×1 IMPLANT
GLOVE BIOGEL PI INDICATOR 8 (GLOVE) ×1
GOWN PREVENTION PLUS XXLARGE (GOWN DISPOSABLE) ×2 IMPLANT
GOWN STRL NON-REIN LRG LVL3 (GOWN DISPOSABLE) ×4 IMPLANT
HANDPIECE INTERPULSE COAX TIP (DISPOSABLE)
KIT BASIN OR (CUSTOM PROCEDURE TRAY) ×2 IMPLANT
KIT ROOM TURNOVER OR (KITS) ×2 IMPLANT
MANIFOLD NEPTUNE II (INSTRUMENTS) ×2 IMPLANT
NS IRRIG 1000ML POUR BTL (IV SOLUTION) ×2 IMPLANT
PACK ORTHO EXTREMITY (CUSTOM PROCEDURE TRAY) ×2 IMPLANT
PAD ARMBOARD 7.5X6 YLW CONV (MISCELLANEOUS) ×3 IMPLANT
PAD CAST 3X4 CTTN HI CHSV (CAST SUPPLIES) IMPLANT
PADDING CAST COTTON 3X4 STRL (CAST SUPPLIES) ×2
SET HNDPC FAN SPRY TIP SCT (DISPOSABLE) IMPLANT
SPLINT PLASTER EXTRA FAST 3X15 (CAST SUPPLIES) ×1
SPLINT PLASTER GYPS XFAST 3X15 (CAST SUPPLIES) IMPLANT
SPONGE GAUZE 4X4 12PLY (GAUZE/BANDAGES/DRESSINGS) ×2 IMPLANT
SPONGE LAP 18X18 X RAY DECT (DISPOSABLE) ×1 IMPLANT
STOCKINETTE IMPERVIOUS 9X36 MD (GAUZE/BANDAGES/DRESSINGS) IMPLANT
SUT ETHILON 4 0 PS 2 18 (SUTURE) IMPLANT
SUT PROLENE 4 0 PS 2 18 (SUTURE) ×2 IMPLANT
SUT VICRYL RAPIDE 4/0 PS 2 (SUTURE) IMPLANT
TOWEL OR 17X24 6PK STRL BLUE (TOWEL DISPOSABLE) ×2 IMPLANT
TOWEL OR 17X26 10 PK STRL BLUE (TOWEL DISPOSABLE) ×2 IMPLANT
TUBE ANAEROBIC SPECIMEN COL (MISCELLANEOUS) ×1 IMPLANT
TUBE CONNECTING 12X1/4 (SUCTIONS) ×2 IMPLANT
UNDERPAD 30X30 INCONTINENT (UNDERPADS AND DIAPERS) ×2 IMPLANT
WATER STERILE IRR 1000ML POUR (IV SOLUTION) ×2 IMPLANT
YANKAUER SUCT BULB TIP NO VENT (SUCTIONS) ×2 IMPLANT

## 2012-11-30 NOTE — Anesthesia Preprocedure Evaluation (Addendum)
Anesthesia Evaluation  Patient identified by MRN, date of birth, ID band Patient awake    Reviewed: Allergy & Precautions, H&P , NPO status , Patient's Chart, lab work & pertinent test results  Airway Mallampati: II TM Distance: >3 FB     Dental  (+) Teeth Intact and Dental Advisory Given   Pulmonary  breath sounds clear to auscultation        Cardiovascular Rhythm:Regular Rate:Normal     Neuro/Psych    GI/Hepatic   Endo/Other  diabetes, Poorly Controlled, Type 2, Insulin DependentMorbid obesity  Renal/GU      Musculoskeletal  (+) Arthritis -,   Abdominal   Peds  Hematology   Anesthesia Other Findings   Reproductive/Obstetrics                         Anesthesia Physical Anesthesia Plan  ASA: III and emergent  Anesthesia Plan: General   Post-op Pain Management:    Induction: Intravenous, Rapid sequence and Cricoid pressure planned  Airway Management Planned: Oral ETT  Additional Equipment:   Intra-op Plan:   Post-operative Plan: Extubation in OR  Informed Consent: I have reviewed the patients History and Physical, chart, labs and discussed the procedure including the risks, benefits and alternatives for the proposed anesthesia with the patient or authorized representative who has indicated his/her understanding and acceptance.   Dental advisory given  Plan Discussed with: CRNA, Anesthesiologist and Surgeon  Anesthesia Plan Comments:         Anesthesia Quick Evaluation

## 2012-11-30 NOTE — Preoperative (Signed)
Beta Blockers   Reason not to administer Beta Blockers:Not Applicable 

## 2012-11-30 NOTE — H&P (Signed)
[name] is an 37 y.o. female.   Chief Complaint: Right hand infection HPI: This patient presented to the office today for followup, noting increased drainage over the past 36 hours from the right long finger.  She last ate at 3 PM.  She has not had a good appetite since her finger infection blossomed a week ago.   Her last history of present illness follows:[name] is a 37 y.o. female who complains of  right long finger pain, swelling, and drainage. She is an insulin-requiring diabetic who reports that she had a manicure on Saturday, 11-14-12 and the following day began to have some pain and swelling of the digit. She presented to the emergency department at [facility] on 11-19-12, where apparently a paronychia was treated or drained in some fashion she was placed on Keflex. No specific followup reevaluation was arranged. The patient presented to her primary care physician today because of increasing and worsening pain and swelling, beginning of fevers and chills, and increased drainage from the finger. She was sent from her primary physician to the emergency departm  Past Medical History  Diagnosis Date  . Diabetes mellitus   . Urinary tract infection     hx of  . Diabetic neuropathy   . Arthritis   . Herpes virus disease   . Chronic back pain   . Hip fracture     left side  . Cataracts, bilateral   . Abscess 11/2012    RIGHT FINGER    Past Surgical History  Procedure Laterality Date  . Fracture surgery      surgery left hip  . Cesarean section      x 2  . Cataract extraction      left  . Cataract extraction w/phaco  06/12/2012    Procedure: CATARACT EXTRACTION PHACO AND INTRAOCULAR LENS PLACEMENT (IOC);  Surgeon: [name], MD;  Location: [facility] OR;  Service: Ophthalmology;  Laterality: Right;  . Cataract extraction w/phaco  06/15/2012    Procedure: CATARACT EXTRACTION PHACO AND INTRAOCULAR LENS PLACEMENT (IOC);  Surgeon: [name], MD;  Location: [facility] OR;   Service: Ophthalmology;  Laterality: Left;  . I&d extremity Right 11/23/2012    Procedure: IRRIGATION AND DEBRIDEMENT EXTREMITY;  Surgeon: Jodi Marble, MD;  Location: [facility] OR;  Service: Orthopedics;  Laterality: Right;    No family history on file. Social History:  reports that she has never smoked. She has never used smokeless tobacco. She reports that she does not drink alcohol or use illicit drugs.  Allergies:  Allergies  Allergen Reactions  . Phenergan (Promethazine Hcl)     Does not know reaction  . Tramadol     Unknown    No prescriptions prior to admission    No results found for this or any previous visit (from the past 48 hour(s)). No results found.  Review of Systems  All other systems reviewed and are negative.    Last menstrual period 11/14/2012. Physical Exam  Constitutional:  WD, WN, NAD HEENT:  NCAT, EOMI Neuro/Psych:  Alert & oriented to person, place, and time; appropriate mood & affect Lymphatic: No generalized UE edema or lymphadenopathy Extremities / MSK:  The extremities are normal with respect to appearance, ranges of motion, joint stability, muscle strength/tone, sensation, & perfusion except as otherwise noted:  The right long finger is swollen. Some of the nailbed is necrotic.  Pus is draining from longitudinal incisions on the radial mid axial line.  Stitches removed from the proximal palmar  and dorsal hand incisions.  No expressible purulence from there.  There is no tenderness with palpation at the wrist and proximal.  The hand remains puffy and swollen.  Assessment/Plan Worsening right hand infection Following conversion to oral antibiotics and discharge  We will admit for repeat I and D and IV antibiotic therapy.  We'll plan to place a PICC line and have home IV antibiotics.Discussion held with the patient.  Expectations conveyed.  Informed consent obtained and site marked.  Reshanda Lewey A. 11/30/2012, 5:37 PM

## 2012-11-30 NOTE — Transfer of Care (Signed)
Immediate Anesthesia Transfer of Care Note  Patient: [name]  Procedure(s) Performed: Procedure(s): IRRIGATION AND DEBRIDEMENT Right hand Abscess (Right)  Patient Location: PACU  Anesthesia Type:General  Level of Consciousness: awake, alert  and oriented  Airway & Oxygen Therapy: Patient Spontanous Breathing and Patient connected to nasal cannula oxygen  Post-op Assessment: Report given to PACU RN and Post -op Vital signs reviewed and stable  Post vital signs: Reviewed and stable  Complications: No apparent anesthesia complications

## 2012-11-30 NOTE — Op Note (Signed)
11/30/2012  9:21 PM  PATIENT:  [name]  37 y.o. female  PRE-OPERATIVE DIAGNOSIS:  Worsened right LF/hand deep infection  POST-OPERATIVE DIAGNOSIS:  Same  PROCEDURE:  Right lF incisional drainage. Right hand dorsal incisional drainage and excisional debridement of subcutaneous tissue, extensor tenosynovium, and phlegmon.  Right hand palmar incision and drainage to include radial bursa, ulnar bursa, and carpal tunnel with carpal tunnel release with debridement of flexor tenosynovium.. Right long finger PIP and MP joint manipulations.  SURGEON: Cliffton Asters. Janee Morn, MD  PHYSICIAN ASSISTANT: None  ANESTHESIA:  general  SPECIMENS:  None  DRAINS:   None  PREOPERATIVE INDICATIONS:  [name] is a  37 y.o. female with a diagnosis of worsening deep infection of right hand following discharge on PO antibiotics  The risks benefits and alternatives were discussed with the patient preoperatively including but not limited to the risks of infection, bleeding, nerve injury, cardiopulmonary complications, the need for revision surgery, among others, and the patient verbalized understanding and consented to proceed.  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 Minimal findings on the palm side.  OPERATIVE PROCEDURE:  The patient was escorted to the operative theatre and placed in a supine position.  GA was administered.  A surgical "time-out" was performed during which the planned procedure, proposed operative site, and the correct patient identity were compared to the operative consent and agreement confirmed by the circulating nurse according to current facility policy.  Following application of a tourniquet to the operative extremity, the exposed skin was prepped with Chloraprep and draped in the usual sterile fashion.  The limb was exsanguinated with gravity and the tourniquet inflated to approximately  higher than systolic BP.  The 2 radial midaxial incisions on the long finger were connected. Cultures were obtained. Gross purulence was evident in the subcutaneous tissues. This was debrided with a rongeur and curettes. The dorsal incision was extended in a curvilinear fashion, making a semicircular incision all the way from the base of the MP joint to the level of the wrist. The flaps were reflected. There was chronic organizing subcutaneous infection thickened tenosynovium. This was debrided with curettes, rongeurs, and side of the absent. Once satisfied with the debridement this level, the thickened sloughing epidermis only long finger was debrided to include the nailbed. The long finger was much softer and decompressed following all of this. On the palm side the previous incision was extended across the mid palm and longitudinally in the central palm up to the level of the wrist crease to include carpal tunnel release. Subcutaneous tissues were dissected with blunt spreading dissection. A transverse carpal ligament was divided. All the palm work was done with clean isthmus. There was some serous-type fluid in the palm and the flexor tenosynovium in the mid palm was slightly thickened. This was debrided. The long finger was manipulated into full extension and full flexion. The digit was flexed, there were no significant tenosynovial findings once the tendon that had been in the finger was now exposed in the mid palm. All of these wounds were now copiously irrigated. 4-0 Prolene was used to close the palm incision, leaving a Penrose emanating from the proximal and distal aspects of the wound. On the dorsum, same thing was done for the dorsal hand incision. To be noted that this dorsal cavitation extended all the way to the level of the prominence BMPs and down the thumb to the level of the  MP joint but did not extend into any digit or more proximal than the base of the hand. The long finger mid axial  radial incision was closed with just one single suture to reapproximate midportion. A bulky dressing was applied placing the MP joints into flexion and the IP joint into extension and the patient was awakened and taken to recovery in stable condition.  DISPOSITION: She will be admitted to the floor for further observation, antibiotic therapy, and arrangements for home IV antibiotics.

## 2012-11-30 NOTE — Anesthesia Procedure Notes (Signed)
Procedure Name: Intubation Date/Time: 11/30/2012 9:50 PM Performed by: Julianne Rice Z Pre-anesthesia Checklist: Patient identified, Timeout performed, Emergency Drugs available, Suction available and Patient being monitored Patient Re-evaluated:Patient Re-evaluated prior to inductionOxygen Delivery Method: Circle system utilized Preoxygenation: Pre-oxygenation with 100% oxygen Intubation Type: IV induction and Cricoid Pressure applied Laryngoscope Size: Mac and 3 Grade View: Grade I Tube type: Oral Number of attempts: 1 Airway Equipment and Method: Lighted stylet Placement Confirmation: ETT inserted through vocal cords under direct vision,  breath sounds checked- equal and bilateral and positive ETCO2 Secured at: 2 cm Tube secured with: Tape Dental Injury: Teeth and Oropharynx as per pre-operative assessment

## 2012-12-01 ENCOUNTER — Encounter (HOSPITAL_COMMUNITY): Payer: Self-pay | Admitting: Orthopedic Surgery

## 2012-12-01 LAB — GLUCOSE, CAPILLARY

## 2012-12-01 LAB — POCT I-STAT 4, (NA,K, GLUC, HGB,HCT)
Glucose, Bld: 198 mg/dL — ABNORMAL HIGH (ref 70–99)
HCT: 31 % — ABNORMAL LOW (ref 36.0–46.0)
Hemoglobin: 10.5 g/dL — ABNORMAL LOW (ref 12.0–15.0)

## 2012-12-01 MED ORDER — SIMVASTATIN 10 MG PO TABS
10.0000 mg | ORAL_TABLET | Freq: Every day | ORAL | Status: DC
  Administered 2012-12-01 – 2012-12-03 (×4): 10 mg via ORAL
  Filled 2012-12-01 (×5): qty 1

## 2012-12-01 MED ORDER — SODIUM CHLORIDE 0.9 % IV SOLN
INTRAVENOUS | Status: AC
  Administered 2012-12-01: 01:00:00 via INTRAVENOUS

## 2012-12-01 MED ORDER — INSULIN NPH ISOPHANE & REGULAR (70-30) 100 UNIT/ML ~~LOC~~ SUSP: 30.0000 [IU] | Freq: Two times a day (BID) | SUBCUTANEOUS | Status: DC

## 2012-12-01 MED ORDER — INSULIN ASPART PROT & ASPART (70-30 MIX) 100 UNIT/ML ~~LOC~~ SUSP: 30.0000 [IU] | Freq: Every day | SUBCUTANEOUS | Status: DC

## 2012-12-01 MED ORDER — INSULIN ASPART PROT & ASPART (70-30 MIX) 100 UNIT/ML ~~LOC~~ SUSP
45.0000 [IU] | Freq: Every day | SUBCUTANEOUS | Status: DC
  Administered 2012-12-01 – 2012-12-04 (×3): 45 [IU] via SUBCUTANEOUS
  Filled 2012-12-01: qty 10

## 2012-12-01 MED ORDER — CYCLOBENZAPRINE HCL 10 MG PO TABS
10.0000 mg | ORAL_TABLET | Freq: Three times a day (TID) | ORAL | Status: DC | PRN
  Administered 2012-12-01 (×2): 10 mg via ORAL
  Filled 2012-12-01 (×2): qty 1

## 2012-12-01 MED ORDER — SODIUM CHLORIDE 0.9 % IJ SOLN
10.0000 mL | INTRAMUSCULAR | Status: DC | PRN
  Administered 2012-12-04: 10 mL

## 2012-12-01 NOTE — Progress Notes (Signed)
Peripherally Inserted Central Catheter/Midline Placement  The IV Nurse has discussed with the patient and/or persons authorized to consent for the patient, the purpose of this procedure and the potential benefits and risks involved with this procedure.  The benefits include less needle sticks, lab draws from the catheter and patient may be discharged home with the catheter.  Risks include, but not limited to, infection, bleeding, blood clot (thrombus formation), and puncture of an artery; nerve damage and irregular heat beat.  Alternatives to this procedure were also discussed.  PICC/Midline Placement Documentation        [name] 12/01/2012, 7:08 PM

## 2012-12-01 NOTE — Progress Notes (Signed)
Arrived to insert Midline catheter but patient had just been medicated with Percocet and was unable to comprehend explanation of procedure and risks.  Staff RN will notify IV Team when patient is awake and alert

## 2012-12-01 NOTE — Anesthesia Postprocedure Evaluation (Signed)
  Anesthesia Post-op Note  Patient: [name]  Procedure(s) Performed: Procedure(s): IRRIGATION AND DEBRIDEMENT Right hand Abscess (Right)  Patient Location: PACU  Anesthesia Type:General  Level of Consciousness: awake, alert  and oriented  Airway and Oxygen Therapy: Patient Spontanous Breathing  Post-op Pain: mild  Post-op Assessment: Post-op Vital signs reviewed  Post-op Vital Signs: Reviewed  Complications: No apparent anesthesia complications

## 2012-12-01 NOTE — Consult Note (Signed)
[facility]     Reason for Consult: finger abscess    Referring Physician: Dr. Janee Morn  Active Problems:   * No active hospital problems. *   . cefTRIAXone (ROCEPHIN)  IV  2 g Intravenous Q24H  . docusate sodium  100 mg Oral BID  . insulin aspart  0-20 Units Subcutaneous TID WC  . insulin aspart protamine-insulin aspart  30 Units Subcutaneous Q supper  . insulin aspart protamine-insulin aspart  45 Units Subcutaneous Q breakfast  . multivitamin with minerals  1 tablet Oral Daily  . simvastatin  10 mg Oral QHS  . vitamin C  1,000 mg Oral Daily    Recommendations: Ceftriaxone 2 grams daily for at least 2 weeks and reassess. She may be able to change back to PO therapy at the time for continuation, depending on recent cultures.     I will check an HIV for routine CDC screening She should have a weekly CBC with diff and CMP while on antibiotics We will arrange follow up in [facility] in about 2 weeks  Assessment: She is a diabetic with a recent finger infection with a culture that grew Group B Strep and another culture with multiple organisms, not Staph aureus and not Group A Strep, now having failed po therapy.  Infection does not involve the bone or joint, possible tenosynovitis from the operative report.     Antibiotics: Ceftriaxone day 2, previously on vancomycin/zosyn, ceftriaxone one day and amoxicillin.   HPI: [name] is a 37 y.o. female with diabetes who gets her nails done twice a month who presented with infection of the right middle finger last week requiring operative management done on 3/11.  She was discharged on therapy but over the weekend developed worsening drainage that became more c/w pus and presented to her PCP then Dr. Janee Morn who took her to the OR yesterday.  She was having some subjective fever and chills.  Her last HgbA1C was 11.     Review of Systems: Pertinent items are noted in HPI.  Past Medical History  Diagnosis  Date  . Diabetes mellitus   . Urinary tract infection     hx of  . Diabetic neuropathy   . Arthritis   . Herpes virus disease   . Chronic back pain   . Hip fracture     left side  . Cataracts, bilateral   . Abscess 11/2012    RIGHT FINGER    History  Substance Use Topics  . Smoking status: Never Smoker   . Smokeless tobacco: Never Used  . Alcohol Use: No    No family history on file. Allergies  Allergen Reactions  . Phenergan (Promethazine Hcl)     Does not know reaction  . Tramadol     Unknown    OBJECTIVE: Blood pressure 146/80, pulse 99, temperature 98.5 F (36.9 C), temperature source Oral, resp. rate 18, height 5\' 2"  (1.575 m), weight 238 lb (107.956 kg), last menstrual period 11/14/2012, SpO2 100.00%. General: Awake, alert, oriented x 3 Skin: no rashes Lungs: CTA B Cor: RRR without m/r/g Ext: right hand wrapped past wrist  Microbiology: Recent Results (from the past 240 hour(s))  CULTURE, BLOOD (ROUTINE X 2)     Status: None   Collection Time    11/23/12  8:35 PM      Result Value Range Status   Specimen Description BLOOD LEFT ARM   Final   Special Requests BOTTLES DRAWN AEROBIC ONLY 10CC  Final   Culture  Setup Time 11/24/2012 02:53   Final   Culture NO GROWTH 5 DAYS   Final   Report Status 11/30/2012 FINAL   Final  CULTURE, BLOOD (ROUTINE X 2)     Status: None   Collection Time    11/23/12  8:40 PM      Result Value Range Status   Specimen Description BLOOD RIGHT ARM   Final   Special Requests BOTTLES DRAWN AEROBIC ONLY 10CC   Final   Culture  Setup Time 11/24/2012 02:53   Final   Culture NO GROWTH 5 DAYS   Final   Report Status 11/30/2012 FINAL   Final  AFB CULTURE WITH SMEAR     Status: None   Collection Time    11/24/12 12:45 AM      Result Value Range Status   Specimen Description ABSCESS   Final   Special Requests     Final   Value: RT LONG FINGER NAIL BED PATIENT ON FOLLOWING CLINDAMYCIN   ACID FAST SMEAR NO ACID FAST BACILLI SEEN    Final   Culture     Final   Value: CULTURE WILL BE EXAMINED FOR 6 WEEKS BEFORE ISSUING A FINAL REPORT   Report Status PENDING   Incomplete  FUNGUS CULTURE W SMEAR     Status: None   Collection Time    11/24/12 12:45 AM      Result Value Range Status   Specimen Description ABSCESS   Final   Special Requests     Final   Value: RT LONG FINGER NAIL BED PATIENT ON FOLLOWING CLINDAMYCIN   Fungal Smear NO YEAST OR FUNGAL ELEMENTS SEEN   Final   Culture CULTURE IN PROGRESS FOR FOUR WEEKS   Final   Report Status PENDING   Incomplete  CULTURE, ROUTINE-ABSCESS     Status: None   Collection Time    11/24/12 12:45 AM      Result Value Range Status   Specimen Description ABSCESS   Final   Special Requests     Final   Value: RT LONG FINGER NAIL BED PATIENT ON FOLLOWING CLINDAMYCIN   Gram Stain     Final   Value: NO WBC SEEN     NO SQUAMOUS EPITHELIAL CELLS SEEN     ABUNDANT GRAM NEGATIVE RODS     FEW GRAM POSITIVE RODS     FEW GRAM POSITIVE COCCI IN PAIRS   Culture     Final   Value: FEW GROUP B STREP(S.AGALACTIAE)ISOLATED     Note: TESTING AGAINST S. AGALACTIAE NOT ROUTINELY PERFORMED DUE TO PREDICTABILITY OF AMP/PEN/VAN SUSCEPTIBILITY.   Report Status 11/26/2012 FINAL   Final  ANAEROBIC CULTURE     Status: None   Collection Time    11/24/12 12:45 AM      Result Value Range Status   Specimen Description ABSCESS   Final   Special Requests     Final   Value: RT LONG FINGER NAIL BED PATIENT ON FOLLOWING CLINDAMYCIN   Gram Stain     Final   Value: NO WBC SEEN     NO SQUAMOUS EPITHELIAL CELLS SEEN     ABUNDANT GRAM NEGATIVE RODS     FEW GRAM POSITIVE RODS     FEW GRAM POSITIVE COCCI IN PAIRS   Culture NO ANAEROBES ISOLATED   Final   Report Status 11/29/2012 FINAL   Final  CULTURE, ROUTINE-ABSCESS     Status: None   Collection Time  11/24/12 12:50 AM      Result Value Range Status   Specimen Description ABSCESS   Final   Special Requests RIGHT LONG FINGER PATIENT ON FOLLOWING  CLINDAMYCIN   Final   Gram Stain     Final   Value: NO WBC SEEN     RARE SQUAMOUS EPITHELIAL CELLS PRESENT     RARE GRAM NEGATIVE RODS     RARE GRAM NEGATIVE COCCOBACILLI     RARE GRAM POSITIVE COCCI IN PAIRS   Culture     Final   Value: MULTIPLE ORGANISMS PRESENT, NONE PREDOMINANT NO STAPHYLOCOCCUS AUREUS ISOLATED NO GROUP A STREP (S.PYOGENES) ISOLATED   Report Status 11/26/2012 FINAL   Final  ANAEROBIC CULTURE     Status: None   Collection Time    11/24/12 12:50 AM      Result Value Range Status   Specimen Description ABSCESS   Final   Special Requests     Final   Value: RIGHT LONGER FINGER PATIENT ON FOLLOWING CLINDAMYCIN   Gram Stain     Final   Value: NO WBC SEEN     REPI RARE GRAM NEGATIVE RODS     RARE GRAM NEGATIVE COCCOBACILLI     RARE GRAM POSITIVE COCCI     IN PAIRS   Culture NO ANAEROBES ISOLATED   Final   Report Status 11/30/2012 FINAL   Final  AFB CULTURE WITH SMEAR     Status: None   Collection Time    11/24/12 12:50 AM      Result Value Range Status   Specimen Description ABSCESS   Final   Special Requests RIGHT LONG FINGER PATIENT ON FOLLOWING CLINDAMYCIN   Final   ACID FAST SMEAR NO ACID FAST BACILLI SEEN   Final   Culture     Final   Value: CULTURE WILL BE EXAMINED FOR 6 WEEKS BEFORE ISSUING A FINAL REPORT   Report Status PENDING   Incomplete  FUNGUS CULTURE W SMEAR     Status: None   Collection Time    11/24/12 12:50 AM      Result Value Range Status   Specimen Description ABSCESS   Final   Special Requests RIGHT LONG FINGER PATIENT ON FOLLOWING CLINDAMYCIN   Final   Fungal Smear NO YEAST OR FUNGAL ELEMENTS SEEN   Final   Culture CULTURE IN PROGRESS FOR FOUR WEEKS   Final   Report Status PENDING   Incomplete  URINE CULTURE     Status: None   Collection Time    11/24/12  9:46 AM      Result Value Range Status   Specimen Description URINE, CLEAN CATCH   Final   Special Requests NONE   Final   Culture  Setup Time 11/24/2012 10:29   Final   Colony  Count 20,OOO COLONIES/ML   Final   Culture     Final   Value: Multiple bacterial morphotypes present, none predominant. Suggest appropriate recollection if clinically indicated.   Report Status 11/26/2012 FINAL   Final  ANAEROBIC CULTURE     Status: None   Collection Time    11/30/12  6:34 PM      Result Value Range Status   Specimen Description ABSCESS   Final   Special Requests RIGHT LONG FINGER   Final   Gram Stain     Final   Value: NO WBC SEEN     NO SQUAMOUS EPITHELIAL CELLS SEEN     FEW GRAM NEGATIVE RODS  RARE GRAM POSITIVE COCCI IN PAIRS   Culture PENDING   Incomplete   Report Status PENDING   Incomplete  CULTURE, ROUTINE-ABSCESS     Status: None   Collection Time    11/30/12  6:34 PM      Result Value Range Status   Specimen Description ABSCESS   Final   Special Requests RIGHT LONG FINGER   Final   Gram Stain     Final   Value: NO WBC SEEN     NO SQUAMOUS EPITHELIAL CELLS SEEN     FEW GRAM NEGATIVE RODS     RARE GRAM POSITIVE COCCI IN PAIRS   Culture NO GROWTH   Final   Report Status PENDING   Incomplete    Staci Righter, MD [facility] [phone] pager  [phone] cell 12/01/2012, 10:58 AM

## 2012-12-02 DIAGNOSIS — L02519 Cutaneous abscess of unspecified hand: Principal | ICD-10-CM

## 2012-12-02 LAB — GLUCOSE, CAPILLARY
Glucose-Capillary: 85 mg/dL (ref 70–99)
Glucose-Capillary: 110 mg/dL — ABNORMAL HIGH (ref 70–99)

## 2012-12-02 NOTE — Progress Notes (Addendum)
Subjective: 2 Days Post-Op Procedure(s) (LRB): IRRIGATION AND DEBRIDEMENT Right hand Abscess (Right) Patient reports pain as moderate, outside of dressing change Cx--P Gm stain--GPC in pairs, few GNR On Rocephin  Objective: Vital signs in last 24 hours: Temp:  [98.2 F (36.8 C)-98.7 F (37.1 C)] 98.7 F (37.1 C) (03/19 0548) Pulse Rate:  [91-107] 107 (03/19 0548) Resp:  [16-20] 20 (03/19 0548) BP: (117-166)/(62-65) 166/65 mmHg (03/19 0548) SpO2:  [100 %] 100 % (03/19 0548)  Intake/Output from previous day: 03/18 0701 - 03/19 0700 In: 840 [P.O.:840] Out: -  Intake/Output this shift:     Recent Labs  11/30/12 2040 11/30/12 2139  HGB 9.8* 10.5*    Recent Labs  11/30/12 2040 11/30/12 2139  WBC 14.2*  --   RBC 3.61*  --   HCT 28.8* 31.0*  PLT 443*  --     Recent Labs  11/30/12 2040 11/30/12 2139  NA 134* 137  K 4.5 4.6  CL 97  --   CO2 27  --   BUN 12  --   CREATININE 1.59*  --   GLUCOSE 216* 198*  CALCIUM 9.6  --    No results found for this basename: LABPT, INR,  in the last 72 hours  Dressing removed, drains pulled. Hand/digit still swollen, but no frank pus draining at present  Assessment/Plan: 2 Days Post-Op Procedure(s) (LRB): IRRIGATION AND DEBRIDEMENT Right hand Abscess (Right)  Continue IV antibiotics today Poss D/C tomorrow with current plan for 2 weeks IV rocephin (appreciate ID recommendations), depending upon clinical exam in am NPO after MN tonight for eval in AM.  Fredricka Kohrs A. 12/02/2012, 8:10 AM

## 2012-12-02 NOTE — Progress Notes (Signed)
[facility]  Date of Admission:  11/30/2012  Antibiotics:   Subjective: No acute events  Objective: Temp:  [98.5 F (36.9 C)-98.7 F (37.1 C)] 98.7 F (37.1 C) (03/19 0548) Pulse Rate:  [95-107] 107 (03/19 0548) Resp:  [18-20] 20 (03/19 0548) BP: (166)/(62-65) 166/65 mmHg (03/19 0548) SpO2:  [100 %] 100 % (03/19 0548)  General: AAO x 3 Skin: no rashes Lungs: CTA B Ext: wrapped  Lab Results Lab Results  Component Value Date   WBC 14.2* 11/30/2012   HGB 10.5* 11/30/2012   HCT 31.0* 11/30/2012   MCV 79.8 11/30/2012   PLT 443* 11/30/2012    Lab Results  Component Value Date   CREATININE 1.59* 11/30/2012   BUN 12 11/30/2012   NA 137 11/30/2012   K 4.6 11/30/2012   CL 97 11/30/2012   CO2 27 11/30/2012    Lab Results  Component Value Date   ALT 15 11/23/2012   AST 20 11/23/2012   ALKPHOS 85 11/23/2012   BILITOT 0.2* 11/23/2012      Microbiology: Recent Results (from the past 240 hour(s))  CULTURE, BLOOD (ROUTINE X 2)     Status: None   Collection Time    11/23/12  8:35 PM      Result Value Range Status   Specimen Description BLOOD LEFT ARM   Final   Special Requests BOTTLES DRAWN AEROBIC ONLY 10CC   Final   Culture  Setup Time 11/24/2012 02:53   Final   Culture NO GROWTH 5 DAYS   Final   Report Status 11/30/2012 FINAL   Final  CULTURE, BLOOD (ROUTINE X 2)     Status: None   Collection Time    11/23/12  8:40 PM      Result Value Range Status   Specimen Description BLOOD RIGHT ARM   Final   Special Requests BOTTLES DRAWN AEROBIC ONLY 10CC   Final   Culture  Setup Time 11/24/2012 02:53   Final   Culture NO GROWTH 5 DAYS   Final   Report Status 11/30/2012 FINAL   Final  AFB CULTURE WITH SMEAR     Status: None   Collection Time    11/24/12 12:45 AM      Result Value Range Status   Specimen Description ABSCESS   Final   Special Requests     Final   Value: RT LONG FINGER NAIL BED PATIENT ON FOLLOWING CLINDAMYCIN   ACID FAST SMEAR NO ACID  FAST BACILLI SEEN   Final   Culture     Final   Value: CULTURE WILL BE EXAMINED FOR 6 WEEKS BEFORE ISSUING A FINAL REPORT   Report Status PENDING   Incomplete  FUNGUS CULTURE W SMEAR     Status: None   Collection Time    11/24/12 12:45 AM      Result Value Range Status   Specimen Description ABSCESS   Final   Special Requests     Final   Value: RT LONG FINGER NAIL BED PATIENT ON FOLLOWING CLINDAMYCIN   Fungal Smear NO YEAST OR FUNGAL ELEMENTS SEEN   Final   Culture CULTURE IN PROGRESS FOR FOUR WEEKS   Final   Report Status PENDING   Incomplete  CULTURE, ROUTINE-ABSCESS     Status: None   Collection Time    11/24/12 12:45 AM      Result Value Range Status   Specimen Description ABSCESS   Final   Special Requests  Final   Value: RT LONG FINGER NAIL BED PATIENT ON FOLLOWING CLINDAMYCIN   Gram Stain     Final   Value: NO WBC SEEN     NO SQUAMOUS EPITHELIAL CELLS SEEN     ABUNDANT GRAM NEGATIVE RODS     FEW GRAM POSITIVE RODS     FEW GRAM POSITIVE COCCI IN PAIRS   Culture     Final   Value: FEW GROUP B STREP(S.AGALACTIAE)ISOLATED     Note: TESTING AGAINST S. AGALACTIAE NOT ROUTINELY PERFORMED DUE TO PREDICTABILITY OF AMP/PEN/VAN SUSCEPTIBILITY.   Report Status 11/26/2012 FINAL   Final  ANAEROBIC CULTURE     Status: None   Collection Time    11/24/12 12:45 AM      Result Value Range Status   Specimen Description ABSCESS   Final   Special Requests     Final   Value: RT LONG FINGER NAIL BED PATIENT ON FOLLOWING CLINDAMYCIN   Gram Stain     Final   Value: NO WBC SEEN     NO SQUAMOUS EPITHELIAL CELLS SEEN     ABUNDANT GRAM NEGATIVE RODS     FEW GRAM POSITIVE RODS     FEW GRAM POSITIVE COCCI IN PAIRS   Culture NO ANAEROBES ISOLATED   Final   Report Status 11/29/2012 FINAL   Final  CULTURE, ROUTINE-ABSCESS     Status: None   Collection Time    11/24/12 12:50 AM      Result Value Range Status   Specimen Description ABSCESS   Final   Special Requests RIGHT LONG FINGER PATIENT  ON FOLLOWING CLINDAMYCIN   Final   Gram Stain     Final   Value: NO WBC SEEN     RARE SQUAMOUS EPITHELIAL CELLS PRESENT     RARE GRAM NEGATIVE RODS     RARE GRAM NEGATIVE COCCOBACILLI     RARE GRAM POSITIVE COCCI IN PAIRS   Culture     Final   Value: MULTIPLE ORGANISMS PRESENT, NONE PREDOMINANT NO STAPHYLOCOCCUS AUREUS ISOLATED NO GROUP A STREP (S.PYOGENES) ISOLATED   Report Status 11/26/2012 FINAL   Final  ANAEROBIC CULTURE     Status: None   Collection Time    11/24/12 12:50 AM      Result Value Range Status   Specimen Description ABSCESS   Final   Special Requests     Final   Value: RIGHT LONGER FINGER PATIENT ON FOLLOWING CLINDAMYCIN   Gram Stain     Final   Value: NO WBC SEEN     REPI RARE GRAM NEGATIVE RODS     RARE GRAM NEGATIVE COCCOBACILLI     RARE GRAM POSITIVE COCCI     IN PAIRS   Culture NO ANAEROBES ISOLATED   Final   Report Status 11/30/2012 FINAL   Final  AFB CULTURE WITH SMEAR     Status: None   Collection Time    11/24/12 12:50 AM      Result Value Range Status   Specimen Description ABSCESS   Final   Special Requests RIGHT LONG FINGER PATIENT ON FOLLOWING CLINDAMYCIN   Final   ACID FAST SMEAR NO ACID FAST BACILLI SEEN   Final   Culture     Final   Value: CULTURE WILL BE EXAMINED FOR 6 WEEKS BEFORE ISSUING A FINAL REPORT   Report Status PENDING   Incomplete  FUNGUS CULTURE W SMEAR     Status: None   Collection Time    11/24/12  12:50 AM      Result Value Range Status   Specimen Description ABSCESS   Final   Special Requests RIGHT LONG FINGER PATIENT ON FOLLOWING CLINDAMYCIN   Final   Fungal Smear NO YEAST OR FUNGAL ELEMENTS SEEN   Final   Culture CULTURE IN PROGRESS FOR FOUR WEEKS   Final   Report Status PENDING   Incomplete  URINE CULTURE     Status: None   Collection Time    11/24/12  9:46 AM      Result Value Range Status   Specimen Description URINE, CLEAN CATCH   Final   Special Requests NONE   Final   Culture  Setup Time 11/24/2012 10:29   Final    Colony Count 20,OOO COLONIES/ML   Final   Culture     Final   Value: Multiple bacterial morphotypes present, none predominant. Suggest appropriate recollection if clinically indicated.   Report Status 11/26/2012 FINAL   Final  ANAEROBIC CULTURE     Status: None   Collection Time    11/30/12  6:34 PM      Result Value Range Status   Specimen Description ABSCESS   Final   Special Requests RIGHT LONG FINGER   Final   Gram Stain     Final   Value: NO WBC SEEN     NO SQUAMOUS EPITHELIAL CELLS SEEN     FEW GRAM NEGATIVE RODS     RARE GRAM POSITIVE COCCI IN PAIRS   Culture     Final   Value: NO ANAEROBES ISOLATED; CULTURE IN PROGRESS FOR 5 DAYS   Report Status PENDING   Incomplete  CULTURE, ROUTINE-ABSCESS     Status: None   Collection Time    11/30/12  6:34 PM      Result Value Range Status   Specimen Description ABSCESS   Final   Special Requests RIGHT LONG FINGER   Final   Gram Stain     Final   Value: NO WBC SEEN     NO SQUAMOUS EPITHELIAL CELLS SEEN     FEW GRAM NEGATIVE RODS     RARE GRAM POSITIVE COCCI IN PAIRS   Culture NO GROWTH 1 DAY   Final   Report Status PENDING   Incomplete    Studies/Results: No results found.  Assessment/Plan: 1) finger abscess - continue with no new culture growth.  Reevaluation tomorrow for ? Surgery.   -if d/c, will get 2 week of ceftriaxone and consider oral treatment  Staci Righter, MD [facility] [phone] pager   12/02/2012, 2:43 PM

## 2012-12-02 NOTE — Care Management Note (Signed)
CARE MANAGEMENT NOTE 12/02/2012  Patient:  [name], [name]   Account Number:  [id number]  Date Initiated:  12/01/2012  Documentation initiated by:  [facility]  Subjective/Objective Assessment:   Admitted with rt hand abscess     Action/Plan:   plan home IV antibiotics. Patient will need assist at home, has 2 son's. Son's dad will help out. CM offered choice for [facility] agency.   Anticipated DC Date:  12/04/2012   Anticipated DC Plan:  HOME W HOME HEALTH SERVICES      DC Planning Services  CM consult      [facility] Choice  HOME HEALTH   Choice offered to / List presented to:  C-1 Patient        HH arranged  HH-1 RN  HH-4 NURSE'S AIDE  IV Antibiotics      HH agency  [facility]   Status of service:  Completed, signed off Medicare Important Message given?   (If response is "NO", the following Medicare IM given date fields will be blank) Date Medicare IM given:   Date Additional Medicare IM given:    Discharge Disposition:  HOME W HOME HEALTH SERVICES  Per UR Regulation:  Reviewed for med. necessity/level of care/duration of stay  If discussed at Long Length of Stay Meetings, dates discussed:    Comments:

## 2012-12-03 LAB — GLUCOSE, CAPILLARY
Glucose-Capillary: 45 mg/dL — ABNORMAL LOW (ref 70–99)
Glucose-Capillary: 45 mg/dL — ABNORMAL LOW (ref 70–99)
Glucose-Capillary: 143 mg/dL — ABNORMAL HIGH (ref 70–99)
Glucose-Capillary: 174 mg/dL — ABNORMAL HIGH (ref 70–99)

## 2012-12-03 MED ORDER — OXYCODONE-ACETAMINOPHEN 5-325 MG PO TABS: 1.0000 | ORAL_TABLET | ORAL | Status: DC | PRN

## 2012-12-03 MED ORDER — DEXTROSE 50 % IV SOLN
INTRAVENOUS | Status: AC
  Administered 2012-12-03: 50 mL
  Filled 2012-12-03: qty 50

## 2012-12-03 NOTE — Progress Notes (Signed)
Hypoglycemic Event  CBG: 45  Treatment: 15 GM carbohydrate snack  Symptoms: Shaky  Follow-up CBG: Time:1500 CBG Result:45 Possible Reasons for Event: Inadequate meal intake  Comments/MD notified will recheck CBG after another 15 mg snack    [name]  Remember to initiate Hypoglycemia Order Set & complete

## 2012-12-03 NOTE — Progress Notes (Signed)
Notified Dr Janee Morn of pts hypoglycemic event. No new orders at this time. Will hold pts 70/30 per her request and have paged the DM coordinator. Will continue to monitor pts CBG.

## 2012-12-03 NOTE — Progress Notes (Signed)
Subjective: 3 Days Post-Op Procedure(s) (LRB): IRRIGATION AND DEBRIDEMENT Right hand Abscess (Right) Got soaks yday, doing OK this am  Objective: Vital signs in last 24 hours: Temp:  [98.2 F (36.8 C)-98.7 F (37.1 C)] 98.4 F (36.9 C) (03/20 0549) Pulse Rate:  [93-102] 97 (03/20 0549) Resp:  [18-20] 18 (03/20 0549) BP: (143-151)/(71-74) 149/73 mmHg (03/20 0549) SpO2:  [98 %-100 %] 98 % (03/20 0549)  Intake/Output from previous day: 03/19 0701 - 03/20 0700 In: 960 [P.O.:720; I.V.:240] Out: -  Intake/Output this shift:     Recent Labs  11/30/12 2040 11/30/12 2139  HGB 9.8* 10.5*    Recent Labs  11/30/12 2040 11/30/12 2139  WBC 14.2*  --   RBC 3.61*  --   HCT 28.8* 31.0*  PLT 443*  --     Recent Labs  11/30/12 2040 11/30/12 2139  NA 134* 137  K 4.5 4.6  CL 97  --   CO2 27  --   BUN 12  --   CREATININE 1.59*  --   GLUCOSE 216* 198*  CALCIUM 9.6  --    No results found for this basename: LABPT, INR,  in the last 72 hours  Dressing removed, drains pulled. Hand with less edema, no expressible drainage from open areas, no fluctuance Long finger--expressible pus from distal end of incision.  Suture removed, wound opened up and packing placed into it to keep it open and draining if need be.  Assessment/Plan: 3 Days Post-Op Procedure(s) (LRB): IRRIGATION AND DEBRIDEMENT Right hand Abscess (Right)  Continue IV antibiotics today; soaks TID with re-dress.  Packing to be placed into finger wound to keep open. Will re-eval clinically in am, with poss D/C tomorrow with current plan for 2 weeks IV rocephin (appreciate ID recommendations) NPO after MN tonight for eval in AM.  [name] A. 12/03/2012, 8:11 AM

## 2012-12-03 NOTE — Progress Notes (Signed)
Hypoglycemic Event  CBG: 45  Treatment: 15 GM carbohydrate snack Symptoms: Shaky  Follow-up CBG: Time:1515 CBG Result52  Possible Reasons for Event: Inadequate meal intake  Comments/MD notified pending results    Samuel Germany, Dartha Lodge  Remember to initiate Hypoglycemia Order Set & complete

## 2012-12-03 NOTE — Progress Notes (Signed)
Hypoglycemic Event  CBG: 52  Treatment: D50 IV 25 mL  Symptoms: Sweaty  Follow-up CBG: Time:1530 CBG Result:143  Possible Reasons for Event: Inadequate meal intake  Comments/MD notified:will notify Dr Wallis Bamberg, [facility]  Remember to initiate Hypoglycemia Order Set & complete

## 2012-12-04 LAB — CULTURE, ROUTINE-ABSCESS: Gram Stain: NONE SEEN

## 2012-12-04 LAB — GLUCOSE, CAPILLARY
Glucose-Capillary: 124 mg/dL — ABNORMAL HIGH (ref 70–99)
Glucose-Capillary: 216 mg/dL — ABNORMAL HIGH (ref 70–99)

## 2012-12-04 MED ORDER — CIPROFLOXACIN HCL 500 MG PO TABS: 500.0000 mg | ORAL_TABLET | Freq: Three times a day (TID) | ORAL | Status: DC

## 2012-12-04 MED ORDER — HEPARIN SOD (PORK) LOCK FLUSH 100 UNIT/ML IV SOLN
250.0000 [IU] | INTRAVENOUS | Status: AC | PRN
  Administered 2012-12-04: 500 [IU]

## 2012-12-04 MED ORDER — CIPROFLOXACIN HCL 500 MG PO TABS
500.0000 mg | ORAL_TABLET | Freq: Three times a day (TID) | ORAL | Status: DC
  Administered 2012-12-04: 500 mg via ORAL
  Filled 2012-12-04 (×2): qty 1

## 2012-12-04 MED ORDER — DEXTROSE 50 % IV SOLN
INTRAVENOUS | Status: AC
  Administered 2012-12-04: 50 mL
  Filled 2012-12-04: qty 50

## 2012-12-04 MED ORDER — CIPROFLOXACIN HCL 500 MG PO TABS
500.0000 mg | ORAL_TABLET | Freq: Once | ORAL | Status: DC
  Filled 2012-12-04: qty 1

## 2012-12-04 NOTE — Discharge Summary (Addendum)
Physician Discharge Summary  Patient ID: [name] MRN: [id number] DOB/AGE: 1976/06/15 37 y.o.  Admit date: 11/30/2012 Discharge date: 12/04/2012  Admission Diagnoses:  Right hand infection with diabetes  Discharge Diagnoses:  Active Problems:   * No active hospital problems. *   Past Medical History  Diagnosis Date  . Diabetes mellitus   . Urinary tract infection     hx of  . Diabetic neuropathy   . Arthritis   . Herpes virus disease   . Chronic back pain   . Hip fracture     left side  . Cataracts, bilateral   . Abscess 11/2012    RIGHT FINGER    Surgeries: Procedure(s): IRRIGATION AND DEBRIDEMENT Right hand Abscess on 11/30/2012 - 12/01/2012   Consultants (if any):    Discharged Condition: Improved  Hospital Course: [name] is an 37 y.o. female who was admitted 11/30/2012 with a diagnosis of <principal problem not specified> and went to the operating room on 11/30/2012 - 12/01/2012 and underwent the above named procedures.   A PICC line was placed.  She underwent clinical observation, pain management, and wound care and was discharged in improved clinical condition.  Her long finger wound was draining appropriately, becoming softer, with poor tip sensibility and tremendous stiffness.  The hand had no significant drainage or tenderness dorsally or volarly.  Due to some continued seropurulent drainage from the distal finger incision, packing will be maintained to keep it open and draining and I will start Cipro to cover any pseudomonal contribution of this.  D/C home with home IV antibiotics and wound care.  F/u on Monday in the office.   Anti-infectives   Start     Dose/Rate Route Frequency Ordered Stop   12/04/12 1000  ciprofloxacin (CIPRO) tablet 500 mg     500 mg Oral 3 times daily 12/04/12 0820     12/04/12 0000  ciprofloxacin (CIPRO) 500 MG tablet     500 mg Oral 3 times daily 12/04/12 0821     11/30/12 2215  cefTRIAXone (ROCEPHIN) 2 g in dextrose 5 % 50  mL IVPB  Status:  Discontinued     2 g 100 mL/hr over 30 Minutes Intravenous To Surgery 11/30/12 2214 12/01/12 0009   11/30/12 2000  cefTRIAXone (ROCEPHIN) 2 g in dextrose 5 % 50 mL IVPB     2 g 100 mL/hr over 30 Minutes Intravenous Every 24 hours 11/30/12 1952      .  She was given sequential compression devices, early ambulation  for DVT prophylaxis.  She benefited maximally from the hospital stay and there were no complications.    Recent vital signs:  Filed Vitals:   12/04/12 0500  BP: 132/68  Pulse: 83  Temp: 98.2 F (36.8 C)  Resp: 14    Recent laboratory studies:  Lab Results  Component Value Date   HGB 10.5* 11/30/2012   HGB 9.8* 11/30/2012   HGB 9.9* 11/26/2012   Lab Results  Component Value Date   WBC 14.2* 11/30/2012   PLT 443* 11/30/2012   No results found for this basename: INR   Lab Results  Component Value Date   NA 137 11/30/2012   K 4.6 11/30/2012   CL 97 11/30/2012   CO2 27 11/30/2012   BUN 12 11/30/2012   CREATININE 1.59* 11/30/2012   GLUCOSE 198* 11/30/2012    Discharge Medications:     Medication List    STOP taking these medications       amoxicillin-clavulanate 500-125  MG per tablet  Commonly known as:  AUGMENTIN      TAKE these medications       ascorbic acid 1000 MG tablet  Commonly known as:  VITAMIN C  Take 1 tablet (1,000 mg total) by mouth daily.     ciprofloxacin 500 MG tablet  Commonly known as:  CIPRO  Take 1 tablet (500 mg total) by mouth 3 (three) times daily.     cyclobenzaprine 10 MG tablet  Commonly known as:  FLEXERIL  Take 10 mg by mouth 3 (three) times daily as needed for muscle spasms.     DSS 100 MG Caps  Take 100 mg by mouth 2 (two) times daily.     insulin NPH-insulin regular (70-30) 100 UNIT/ML injection  Commonly known as:  NOVOLIN 70/30  Inject 30-45 Units into the skin 2 (two) times daily with a meal. 45 units in am,  30 units pm     oxyCODONE-acetaminophen 5-325 MG per tablet  Commonly known as:   PERCOCET/ROXICET  Take 1-2 tablets by mouth every 4 (four) hours as needed for pain.     oxyCODONE-acetaminophen 5-325 MG per tablet  Commonly known as:  PERCOCET/ROXICET  Take 1-2 tablets by mouth every 4 (four) hours as needed.     simvastatin 10 MG tablet  Commonly known as:  ZOCOR  Take 10 mg by mouth at bedtime.        Diagnostic Studies: Dg Chest 2 View  11/24/2012  *RADIOLOGY REPORT*  Clinical Data: Shortness of breath  CHEST - 2 VIEW  Comparison: 07/11/2012  Findings: There is an overlying artifact which obscures detailed evaluation. Allowing for this, lungs are clear. No pleural effusion or pneumothorax. The cardiomediastinal contours are within normal limits. The visualized bones and soft tissues are without significant appreciable abnormality.  IMPRESSION: No radiographic evidence of acute cardiopulmonary process.   Original Report Authenticated By: Jearld Lesch, M.D.     Disposition: 01-Home or Self Care       Future Appointments Provider Department Dept Phone   12/22/2012 11:15 AM Gardiner Barefoot, MD [facility] [phone]      Follow-up Information   Schedule an appointment as soon as possible for a visit with Jodi Marble., MD. (Monday 3-24 at 12:30pm)    Contact information:   [address] [place] [address] [phone]        Signed: Zechariah Bissonnette A. 12/04/2012, 8:24 AM

## 2012-12-04 NOTE — Progress Notes (Signed)
Discharge Note: Pt is alert and oriented, VS are stable, denies CP. IV team to SL lock PICC line.  Discharge instructions reviewed with patient, pt verbalizes understanding.  Wheelchair transportation provided, all belongings with patient

## 2012-12-04 NOTE — Progress Notes (Signed)
When d/cing patient, patient c/o weakness and feeling shaky. Felt blood sugar was getting low. Checked blood sugar was 46. Gave 2 OJ's. Patient requested d50, as this worked best for her yesterday. Gave 15mg  D50.  Will continue to monitor patient and discharge when stable.

## 2012-12-04 NOTE — Progress Notes (Addendum)
Inpatient Diabetes Program Recommendations  AACE/ADA: New Consensus Statement on Inpatient Glycemic Control (2013)  Target Ranges:  Prepandial:   less than 140 mg/dL      Peak postprandial:   less than 180 mg/dL (1-2 hours)      Critically ill patients:  140 - 180 mg/dL   Results for [name], [name] (MRN [id number]) as of 12/04/2012 09:20  Ref. Range 12/03/2012 06:57 12/03/2012 11:21 12/03/2012 14:45 12/03/2012 14:58 12/03/2012 15:13 12/03/2012 15:40 12/03/2012 18:27 12/03/2012 21:51 12/04/2012 06:52  Glucose-Capillary Latest Range: 70-99 mg/dL 409 (H) 811 (H) 45 (L) 45 (L) 52 (L) 143 (H) 142 (H) 174 (H) 124 (H)    Note: Noted that patient will be discharged today and according to the discharge orders, patient will resume home diabetes medication regimen.  In reviewing blood glucose trends while here in the hospital, blood glucose has been fairly controlled on much less insulin than taken at home.  Patient has not receive any supper doses of 70/30 since admission (charted as refused) and on 3/20 she did not receive any 70/30 at breakfast.  On 3/20 the only insulin administered to the patient was Novolog 4 units correction at lunch for blood glucose of 197 mg/dl.  Concerned that if home doses of 70/30 insulin resumed, patient will have hypoglycemic events.  Called patient and discussed home insulin regimen and patient reports that if her blood glucose is less than 100 mg/dl she dose not take 91/47 or she may take a reduced amount if her appetite is poor.  According to the patient she frequently makes insulin adjustments and takes less amounts than prescribed by her endocrinologist, Dr. Chestine Spore.  Reviewed proper treatment for low blood sugar with patient and requested that she check blood glucose about 4 times a day (she currently only checks before breakfast and at bedtime) and that she keep a log to take with her to her follow up appointment with Dr. Chestine Spore and/or her primary doctor.  Also requested that she call  Dr. Chestine Spore if she begins to have low blood sugars at home to discuss changes in home insulin regimen.  Feliberto Harts, RN to discuss concerns and ask that she make Dr. Janee Morn aware that patient has not been receiving as much insulin while inpatient and make him aware of concern for hypoglycemia as an outpatient if home insulin regimen is continued.   Thanks, Orlando Penner, RN, BSN, CCRN Diabetes Coordinator Inpatient Diabetes Program [phone]

## 2012-12-05 LAB — ANAEROBIC CULTURE

## 2012-12-11 ENCOUNTER — Emergency Department (HOSPITAL_COMMUNITY): Payer: Medicaid Other

## 2012-12-11 ENCOUNTER — Emergency Department (HOSPITAL_COMMUNITY)
Admission: EM | Admit: 2012-12-11 | Discharge: 2012-12-11 | Disposition: A | Discharge status: Home / Self Care | Payer: Medicaid Other | Attending: Emergency Medicine | Admitting: Emergency Medicine

## 2012-12-11 ENCOUNTER — Ambulatory Visit (HOSPITAL_COMMUNITY): Payer: Medicaid Other

## 2012-12-11 ENCOUNTER — Encounter (HOSPITAL_COMMUNITY): Payer: Self-pay | Admitting: *Deleted

## 2012-12-11 ENCOUNTER — Other Ambulatory Visit (HOSPITAL_COMMUNITY): Payer: Self-pay | Admitting: Emergency Medicine

## 2012-12-11 DIAGNOSIS — T82898A Other specified complication of vascular prosthetic devices, implants and grafts, initial encounter: Secondary | ICD-10-CM

## 2012-12-11 DIAGNOSIS — Z79899 Other long term (current) drug therapy: Secondary | ICD-10-CM | POA: Insufficient documentation

## 2012-12-11 DIAGNOSIS — Z87440 Personal history of urinary (tract) infections: Secondary | ICD-10-CM | POA: Insufficient documentation

## 2012-12-11 DIAGNOSIS — M79609 Pain in unspecified limb: Secondary | ICD-10-CM

## 2012-12-11 DIAGNOSIS — G909 Disorder of the autonomic nervous system, unspecified: Secondary | ICD-10-CM | POA: Insufficient documentation

## 2012-12-11 DIAGNOSIS — Z8619 Personal history of other infectious and parasitic diseases: Secondary | ICD-10-CM | POA: Insufficient documentation

## 2012-12-11 DIAGNOSIS — T82598A Other mechanical complication of other cardiac and vascular devices and implants, initial encounter: Secondary | ICD-10-CM | POA: Insufficient documentation

## 2012-12-11 DIAGNOSIS — R079 Chest pain, unspecified: Secondary | ICD-10-CM | POA: Insufficient documentation

## 2012-12-11 DIAGNOSIS — E1149 Type 2 diabetes mellitus with other diabetic neurological complication: Secondary | ICD-10-CM | POA: Insufficient documentation

## 2012-12-11 DIAGNOSIS — Z794 Long term (current) use of insulin: Secondary | ICD-10-CM | POA: Insufficient documentation

## 2012-12-11 DIAGNOSIS — Z8781 Personal history of (healed) traumatic fracture: Secondary | ICD-10-CM | POA: Insufficient documentation

## 2012-12-11 DIAGNOSIS — R0602 Shortness of breath: Secondary | ICD-10-CM

## 2012-12-11 DIAGNOSIS — Z8739 Personal history of other diseases of the musculoskeletal system and connective tissue: Secondary | ICD-10-CM | POA: Insufficient documentation

## 2012-12-11 DIAGNOSIS — Y838 Other surgical procedures as the cause of abnormal reaction of the patient, or of later complication, without mention of misadventure at the time of the procedure: Secondary | ICD-10-CM | POA: Insufficient documentation

## 2012-12-11 LAB — BASIC METABOLIC PANEL
GFR calc Af Amer: 70 mL/min — ABNORMAL LOW (ref >90)
GFR calc non Af Amer: 60 mL/min — ABNORMAL LOW (ref >90)
Potassium: 3.6 mEq/L (ref 3.5–5.1)
Sodium: 135 mEq/L (ref 135–145)

## 2012-12-11 LAB — CBC
MCHC: 32.9 g/dL (ref 30.0–36.0)
RDW: 13.2 % (ref 11.5–15.5)
WBC: 11.2 10*3/uL — ABNORMAL HIGH (ref 4.0–10.5)

## 2012-12-11 LAB — GLUCOSE, CAPILLARY
Glucose-Capillary: 67 mg/dL — ABNORMAL LOW (ref 70–99)
Glucose-Capillary: 142 mg/dL — ABNORMAL HIGH (ref 70–99)

## 2012-12-11 LAB — POCT I-STAT TROPONIN I: Troponin i, poc: 0 ng/mL (ref 0.00–0.08)

## 2012-12-11 MED ORDER — CHLORHEXIDINE GLUCONATE 4 % EX LIQD
CUTANEOUS | Status: AC
  Filled 2012-12-11: qty 45

## 2012-12-11 MED ORDER — DEXTROSE 5 % IV SOLN
2.0000 g | Freq: Once | INTRAVENOUS | Status: AC
  Administered 2012-12-11: 2 g via INTRAVENOUS
  Filled 2012-12-11: qty 2

## 2012-12-11 MED ORDER — IOHEXOL 350 MG/ML SOLN
100.0000 mL | Freq: Once | INTRAVENOUS | Status: AC | PRN
  Administered 2012-12-11: 100 mL via INTRAVENOUS

## 2012-12-11 NOTE — ED Notes (Signed)
IV contacted to access PICC line and draw blood work.

## 2012-12-11 NOTE — ED Provider Notes (Signed)
History     CSN: [id number]  Arrival date & time 12/11/12  0001   First MD Initiated Contact with Patient 12/11/12 0118      Chief Complaint  Patient presents with  . Shortness of Breath  . Vascular Access Problem    (Consider location/radiation/quality/duration/timing/severity/associated sxs/prior treatment) HPI Comments: Pt with recent surg and picc line for iv abx for rt arm tenosynovitis.  Now with cp and sob.    Patient is a 37 y.o. female presenting with shortness of breath. The history is provided by the patient.  Shortness of Breath Severity:  Mild Onset quality:  Sudden Duration:  4 hours Timing:  Constant Relieved by:  Nothing Worsened by:  Nothing tried Ineffective treatments:  None tried Associated symptoms: chest pain   Risk factors: recent surgery     Past Medical History  Diagnosis Date  . Diabetes mellitus   . Urinary tract infection     hx of  . Diabetic neuropathy   . Arthritis   . Herpes virus disease   . Chronic back pain   . Hip fracture     left side  . Cataracts, bilateral   . Abscess 11/2012    RIGHT FINGER    Past Surgical History  Procedure Laterality Date  . Fracture surgery      surgery left hip  . Cesarean section      x 2  . Cataract extraction      left  . Cataract extraction w/phaco  06/12/2012    Procedure: CATARACT EXTRACTION PHACO AND INTRAOCULAR LENS PLACEMENT (IOC);  Surgeon: [name], MD;  Location: [facility] OR;  Service: Ophthalmology;  Laterality: Right;  . Cataract extraction w/phaco  06/15/2012    Procedure: CATARACT EXTRACTION PHACO AND INTRAOCULAR LENS PLACEMENT (IOC);  Surgeon: [name], MD;  Location: [facility] OR;  Service: Ophthalmology;  Laterality: Left;  . I&d extremity Right 11/23/2012    Procedure: IRRIGATION AND DEBRIDEMENT EXTREMITY;  Surgeon: Jodi Marble, MD;  Location: [facility] OR;  Service: Orthopedics;  Laterality: Right;  . I&d extremity Right 11/30/2012    Procedure: IRRIGATION AND DEBRIDEMENT Right hand  Abscess;  Surgeon: Jodi Marble, MD;  Location: [facility] OR;  Service: Orthopedics;  Laterality: Right;    History reviewed. No pertinent family history.  History  Substance Use Topics  . Smoking status: Never Smoker   . Smokeless tobacco: Never Used  . Alcohol Use: No    OB History   Grav Para Term Preterm Abortions TAB SAB Ect Mult Living                  Review of Systems  Respiratory: Positive for shortness of breath.   Cardiovascular: Positive for chest pain.  All other systems reviewed and are negative.    Allergies  Phenergan and Tramadol  Home Medications   Current Outpatient Rx  Name  Route  Sig  Dispense  Refill  . CefTRIAXone Sodium (ROCEPHIN IV)   Intravenous   Inject into the vein daily.         . ciprofloxacin (CIPRO) 500 MG tablet   Oral   Take 1 tablet (500 mg total) by mouth 3 (three) times daily.   45 tablet   1   . cyclobenzaprine (FLEXERIL) 10 MG tablet   Oral   Take 10 mg by mouth 3 (three) times daily as needed for muscle spasms.         Marland Kitchen docusate sodium 100 MG CAPS   Oral  Take 100 mg by mouth 2 (two) times daily.   30 capsule   0   . insulin NPH-insulin regular (NOVOLIN 70/30) (70-30) 100 UNIT/ML injection   Subcutaneous   Inject 30-45 Units into the skin 2 (two) times daily with a meal. 45 units in am,  30 units pm         . oxyCODONE-acetaminophen (PERCOCET/ROXICET) 5-325 MG per tablet   Oral   Take 1-2 tablets by mouth every 4 (four) hours as needed.   50 tablet   0   . simvastatin (ZOCOR) 10 MG tablet   Oral   Take 10 mg by mouth at bedtime.         . vitamin C (VITAMIN C) 1000 MG tablet   Oral   Take 1 tablet (1,000 mg total) by mouth daily.   30 tablet   0     BP 154/93  Pulse 113  Temp(Src) 97.6 F (36.4 C) (Oral)  SpO2 96%  LMP 11/14/2012  Physical Exam  Constitutional: She is oriented to person, place, and time. She appears well-developed and well-nourished.  HENT:  Head: Normocephalic and  atraumatic.  Eyes: Conjunctivae and EOM are normal. Pupils are equal, round, and reactive to light.  Neck: Normal range of motion.  Cardiovascular: Regular rhythm and normal heart sounds.  Tachycardia present.   Pulmonary/Chest: Effort normal and breath sounds normal.  Abdominal: Soft. Bowel sounds are normal.  Musculoskeletal: Normal range of motion.  Well healing wound to rt hand.  Left arm picc in place  Neurological: She is alert and oriented to person, place, and time.  Skin: Skin is warm and dry.  Psychiatric: She has a normal mood and affect. Her behavior is normal.    ED Course  Procedures (including critical care time)  Labs Reviewed  CBC - Abnormal; Notable for the following:    WBC 11.2 (*)    Hemoglobin 10.4 (*)    HCT 31.6 (*)    Platelets 459 (*)    All other components within normal limits  BASIC METABOLIC PANEL  D-DIMER, QUANTITATIVE  POCT I-STAT TROPONIN I   No results found.   No diagnosis found.    MDM  Concern for pe.  Will ct chest,  reassess        Rosanne Ashing, MD 12/11/12 [phone]

## 2012-12-11 NOTE — Procedures (Signed)
Fluoroscopic guided exchange of left UE PICC performed. Length 45 cm. Tip SVC/RA junction. No immediate complications.

## 2012-12-11 NOTE — Progress Notes (Signed)
Left:  No evidence of DVT or superficial thrombosis.

## 2012-12-11 NOTE — ED Notes (Signed)
PT has healing incision on Dorsal and planter side of Lt hand. No drainage noted at incision site.

## 2012-12-11 NOTE — ED Notes (Signed)
Pt states she went to given herself her 9 pm abx tonight and blood was coming out around the dressing and there was discomfort around PICC site.  Pt also c/o SOB and CP since 10 pm tonight.

## 2012-12-11 NOTE — ED Notes (Signed)
Pt transported to IR 

## 2012-12-11 NOTE — ED Notes (Signed)
Patient transported to Ultrasound 

## 2012-12-11 NOTE — ED Provider Notes (Signed)
Care assumed at the change of shift for replacement of malfunctioning PICC line. Pt to continue home Abx.   Charles B. Bernette Mayers, MD 12/11/12 1151

## 2012-12-11 NOTE — Discharge Instructions (Signed)
Peripherally Inserted Central Catheter (PICC)  Home Guide  A peripherally inserted central catheter (PICC) is a long, thin, flexible tube that is inserted into a vein in the upper arm. It is a form of intravenous (IV) access. It is considered to be a "central" line because the tip of the PICC ends in a large vein in your chest. This large vein is called the superior vena cava (SVC). The PICC tip ends in the SVC because there is a lot of blood flow in the SVC. This allows medicines and IV fluids to be quickly distributed throughout the body. The PICC is inserted using a sterile technique by a specially trained nurse or physician. After the PICC is inserted, a chest X-ray is done to be sure it is in the correct place.   A PICC may be placed for different reasons, such as:   To give medicines and liquid nutrition that can only be given through a central line. Examples are:   Certain antibiotic treatments.   Chemotherapy.   Total parenteral nutrition (TPN).   To take frequent blood samples.   To give IV fluids and blood products.   If there is difficulty placing a peripheral intravenous (PIV) catheter.  If taken care of properly, a PICC can remain in place for several months. A PICC can also allow patients to go home early. Medicine and PICC care can be managed at home by a family member or home healthcare team.  RISKS AND COMPLICATIONS  Possible problems with a PICC can occasionally occur. This may include:   A clot (thrombus) forming in or at the tip of the PICC. This can cause the PICC to become clogged. A "clot-busting" medicine called tissue plasminogen activator (tPA) can be inserted into the PICC to help break up the clot.   Inflammation of the vein (phlebitis) in which the PICC is placed. Signs of inflammation may include redness, pain at the insertion site, red streaks, or being able to feel a "cord" in the vein where the PICC is located.   Infection in the PICC or at the insertion site. Signs of  infection may include fever, chills, redness, swelling, or pus drainage from the PICC insertion site.   PICC movement (malposition). The PICC tip may migrate from its original position due to excessive physical activity, forceful coughing, sneezing, or vomiting.   A break or cut in the PICC. It is important to not use scissors near the PICC.   Nerve or tendon irritation or injury during PICC insertion.  HOME CARE INSTRUCTIONS  Activity   You may bend your arm and move it freely. If your PICC is near or at the bend of your elbow, avoid activity with repeated motion at the elbow.   Avoid lifting heavy objects as instructed by your caregiver.   Avoid using a crutch with the arm on the same side as your PICC. You may need to use a walker.  PICC Dressing   Keep your PICC bandage (dressing) clean and dry to prevent infection.   Ask your caregiver when you may shower. Ask your caregiver to teach you how to wrap the PICC when you do take a shower.   Do not bathe, swim, or use hot tubs when you have a PICC.   Change the PICC dressing as instructed by your caregiver.   Change your PICC dressing if it becomes loose or wet.  General PICC Care   Check the PICC insertion site daily for leakage, redness, swelling,   or pain.   Flush the PICC as directed by your caregiver. Let your caregiver know right away if the PICC is difficult to flush or does not flush. Do not use force to flush the PICC.   Do not use a syringe that is less than 10 mLs to flush the PICC.   Never pull or tug on the PICC.   Avoid blood pressure checks on the arm with the PICC.   Keep your PICC identification card with you at all times.   Do not take the PICC out yourself. Only a trained clinical professional should remove the PICC.  SEEK IMMEDIATE MEDICAL CARE IF:   Your PICC is accidently pulled all the way out. If this happens, cover the insertion site with a bandage or gauze dressing. Do not throw the PICC away. Your caregiver will need to  inspect it.   Your PICC was tugged or pulled and has partially come out. Do not  push the PICC back in.   There is any type of drainage, redness, or swelling where the PICC enters the skin.   You cannot flush the PICC, it is difficult to flush, or the PICC leaks around the insertion site when it is flushed.   You hear a "flushing" sound when the PICC is flushed.   You have pain, discomfort, or numbness in your arm, shoulder, or jaw on the same side as the PICC .   You feel your heart "racing" or skipping beats.   You notice a hole or tear in the PICC.   You develop chills or a fever.  MAKE SURE YOU:    Understand these instructions.   Will watch your condition.   Will get help right away if you are not doing well or get worse.  Document Released: 03/09/2003 Document Revised: 11/25/2011 Document Reviewed: 01/07/2011  [employer] Patient Information 2013 [employer].

## 2012-12-11 NOTE — ED Notes (Signed)
PT reports she developed a discomfort today  While attempting to flush PICC line in LT upper Arm. Pt contacted [facility] and was instructed to come to ED for eval.

## 2012-12-14 ENCOUNTER — Telehealth (HOSPITAL_COMMUNITY): Payer: Self-pay | Admitting: *Deleted

## 2012-12-21 LAB — FUNGUS CULTURE W SMEAR: Fungal Smear: NONE SEEN

## 2012-12-22 ENCOUNTER — Encounter: Payer: Self-pay | Admitting: Internal Medicine

## 2012-12-22 ENCOUNTER — Ambulatory Visit (INDEPENDENT_AMBULATORY_CARE_PROVIDER_SITE_OTHER): Payer: Medicaid Other | Admitting: Internal Medicine

## 2012-12-22 VITALS — BP 165/106 | HR 90 | Temp 97.8°F | Ht 62.0 in | Wt 229.0 lb

## 2012-12-22 DIAGNOSIS — L0291 Cutaneous abscess, unspecified: Secondary | ICD-10-CM

## 2012-12-22 DIAGNOSIS — L0390 Cellulitis, unspecified: Secondary | ICD-10-CM

## 2012-12-22 MED ORDER — LEVOFLOXACIN 500 MG PO TABS: 500.0000 mg | ORAL_TABLET | Freq: Every day | ORAL | Status: DC

## 2012-12-22 NOTE — Progress Notes (Signed)
RN received verbal order to discontinue the patient's PICC line.  Patient identified with name and date of birth. PICC dressing removed, site unremarkable.  Sutures removed without difficulty.  PICC line removed using sterile procedure @ 1215. PICC length equal to that noted in patient's hospital chart of 45 cm. Sterile petroleum gauze + sterile 4X4 applied to PICC site, pressure applied for 10 minutes and covered with Medipore tape as a pressure dressing. Patient tolerated procedure without complaints.  Patient instructed to limit use of arm for 1 hour. Patient instructed that the pressure dressing should remain in place for 24 hours. Patient verbalized understanding of these instructions.

## 2012-12-22 NOTE — Assessment & Plan Note (Signed)
Her infection and finally is improving with the Levaquin. She did grow Streptococcus and the cultures however it seems that there is some organism that is not found in the culture is now improved with the Levaquin. In going to stop the Rocephin and pull her PICC line and continue with the Levaquin for another 2 weeks. We'll recheck her in one week to assure she is improving. She is going to see Dr. Janee Morn tomorrow and I will see if he has any concerns.

## 2012-12-22 NOTE — Progress Notes (Signed)
  Subjective:    Patient ID: [name], female    DOB: 08-04-1976, 37 y.o.   MRN: [id number]  HPI She comes in here for hospital followup of her finger abscess collection.  She was first hospitalized with an abscess of her finger after getting her nails done required operative management and culture grew group B Streptococcus. She was started on amoxicillin though returned to the surgeon's office and noted continued drainage and was readmitted to the hospital. She was started on Rocephin and had  More operative management. Further culture grew out microaerophilic streptococcus. She continued on Rocephin and was also given ciprofloxacin for more gram-negative coverage however in followup with Dr. Janee Morn of hand surgery she was noted have continued drainage in her coverage was broadened to Levaquin with the Rocephin. She comes in now about a week and a half after that and has had notable improvement in her drainage. She says that after a few days on the Levaquin her drainage did improve and has not had any significant pus sense. She does have some chills but no fever. Her finger is healing and she does go back to see Dr. Janee Morn tomorrow. She denies picking at it and otherwise keeps it covered.  She did require to have her PICC line changed.     Review of Systems  Constitutional: Positive for chills. Negative for fever, appetite change and fatigue.  Gastrointestinal: Negative for nausea, abdominal pain and diarrhea.  Musculoskeletal: Negative for myalgias, joint swelling and arthralgias.  Neurological: Negative for dizziness and headaches.       Objective:   Physical Exam  Skin: No rash noted.  Third finger with incision that is open to the tendon but with good granulation tissue, no discharge. Notable edema of the finger and crusting. Hand with a healed incision on the dorsal and ventral region. Some crusting on both of those          Assessment & Plan:

## 2012-12-23 ENCOUNTER — Telehealth: Payer: Self-pay | Admitting: *Deleted

## 2012-12-23 ENCOUNTER — Other Ambulatory Visit: Payer: Self-pay | Admitting: Internal Medicine

## 2012-12-23 MED ORDER — METRONIDAZOLE 500 MG PO TABS: 500.0000 mg | ORAL_TABLET | Freq: Three times a day (TID) | ORAL | Status: DC

## 2012-12-23 NOTE — Telephone Encounter (Signed)
Left message on patient's home number notifying her that a new prescription for flagyl 500 mg tid has been called in to her pharmacy of choice ([facility] at the [address] of [address]).  Asked the patient to please return our call if she has any questions. Andree Coss, RN

## 2012-12-23 NOTE — Telephone Encounter (Signed)
Message copied by Andree Coss on Wed Dec 23, 2012  5:25 PM ------      Message from: Gardiner Barefoot      Created: Wed Dec 23, 2012  5:12 PM       I just talked to the hand surgeon for her and still with some pus.  I would like to add flagyl 500 mg tid.  Please let her know.  Thanks.  I sent it to her pharmacy.  ------

## 2012-12-29 ENCOUNTER — Encounter: Payer: Self-pay | Admitting: Internal Medicine

## 2012-12-29 ENCOUNTER — Ambulatory Visit (INDEPENDENT_AMBULATORY_CARE_PROVIDER_SITE_OTHER): Payer: Medicaid Other | Admitting: Internal Medicine

## 2012-12-29 VITALS — BP 117/77 | HR 93 | Temp 98.5°F | Ht 62.5 in | Wt 225.0 lb

## 2012-12-29 DIAGNOSIS — M869 Osteomyelitis, unspecified: Secondary | ICD-10-CM

## 2012-12-29 DIAGNOSIS — L0291 Cutaneous abscess, unspecified: Secondary | ICD-10-CM

## 2012-12-29 DIAGNOSIS — L0390 Cellulitis, unspecified: Secondary | ICD-10-CM

## 2012-12-29 LAB — CBC WITH DIFFERENTIAL/PLATELET
Basophils Absolute: 0 10*3/uL (ref 0.0–0.1)
HCT: 35 % — ABNORMAL LOW (ref 36.0–46.0)
Lymphocytes Relative: 33 % (ref 12–46)
Monocytes Absolute: 0.4 10*3/uL (ref 0.1–1.0)
Neutro Abs: 4.7 10*3/uL (ref 1.7–7.7)
RBC: 4.3 MIL/uL (ref 3.87–5.11)
RDW: 15.1 % (ref 11.5–15.5)
WBC: 7.7 10*3/uL (ref 4.0–10.5)

## 2012-12-29 NOTE — Assessment & Plan Note (Signed)
She does continue to have a long way to go for healing and is still open. I'm going to have her continue with the Levaquin and Flagyl which has good broad coverage for osteomyelitis and the organisms. I will have her stop the Suprax. She does tell me she uses gauze for packing.  I am going to check a CMP and CBC today to assure no significant side effects and monitor her kidney function to assure she is good with her Levaquin dose.

## 2012-12-29 NOTE — Progress Notes (Signed)
  Subjective:    Patient ID: [name], female    DOB: [birth year], 37 y.o.   MRN: [id number]  HPI She comes in here for followup of her finger infection. 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 She was last seen by Dr. Janee Morn one week ago and had pus expressed from her DIP joint.  She has had some chills but no fever. She does have drainage though is much improved per her report and does not have the foul odor that was noted when it was drained in Dr. Carollee Massed office last week. She has noted some crusting and closure of the proximal portion of the open incision and is concerned that it is closing too soon. She was noted to be using a Q-tip in to her finger in his putting a paper towel in her finger lesion. She has been taking Levaquin, Suprax and I did start her on Flagyl last week which she has been taking 3 times a day as prescribed. She has had no diarrhea, rash or other side effects.   Review of Systems  Constitutional: Positive for chills. Negative for fever.  Gastrointestinal: Negative for abdominal pain, diarrhea and constipation.  Musculoskeletal: Negative for joint swelling.  Skin: Negative for rash.       Objective:   Physical Exam  Constitutional: She appears well-developed and well-nourished. No distress.  Musculoskeletal:  Her finger does appear mildly improved with no active discharge that I see including from the DIP joint. It is still open though starting to crust at the proximal area of the open lesion.          Assessment & Plan:

## 2012-12-30 LAB — COMPLETE METABOLIC PANEL WITH GFR
ALT: 8 U/L (ref 0–35)
AST: 12 U/L (ref 0–37)
Albumin: 4 g/dL (ref 3.5–5.2)
BUN: 9 mg/dL (ref 6–23)
Calcium: 9.8 mg/dL (ref 8.4–10.5)
Chloride: 102 mEq/L (ref 96–112)
Potassium: 4.5 mEq/L (ref 3.5–5.3)

## 2013-01-07 LAB — AFB CULTURE WITH SMEAR (NOT AT ARMC)

## 2013-01-12 ENCOUNTER — Ambulatory Visit (INDEPENDENT_AMBULATORY_CARE_PROVIDER_SITE_OTHER): Payer: Medicaid Other | Admitting: Internal Medicine

## 2013-01-12 ENCOUNTER — Encounter: Payer: Self-pay | Admitting: Internal Medicine

## 2013-01-12 VITALS — BP 115/77 | HR 96 | Temp 98.0°F | Ht 62.5 in | Wt 226.0 lb

## 2013-01-12 DIAGNOSIS — L0390 Cellulitis, unspecified: Secondary | ICD-10-CM

## 2013-01-12 DIAGNOSIS — L0291 Cutaneous abscess, unspecified: Secondary | ICD-10-CM

## 2013-01-12 MED ORDER — LEVOFLOXACIN 500 MG PO TABS: 500.0000 mg | ORAL_TABLET | Freq: Every day | ORAL | Status: DC

## 2013-01-12 NOTE — Assessment & Plan Note (Signed)
Looks much better.  Continue with antibiotics for now (another month or so).   RTC in about 3 weeks.

## 2013-01-12 NOTE — Progress Notes (Signed)
  Subjective:    Patient ID: [name], female    DOB: 1975/11/20, 37 y.o.   MRN: [id number]  HPI She comes in for follow up of her finger infection.  She has been on levaquin and flagyl.  She takes daily and denies missing any.  No discharge/drainage.  No fever or chills.  Wound has closed.  Still some swelling.   Asked about "proving" it was due to the nail salon.  Her lawyer told her difficult to prove.     Review of Systems  Constitutional: Negative for fever and chills.       Objective:   Physical Exam  Musculoskeletal:  Finger swollen without erythema, no drainage.  Flexes some.  No warmth.           Assessment & Plan:

## 2013-02-01 ENCOUNTER — Other Ambulatory Visit: Payer: Self-pay

## 2013-02-04 ENCOUNTER — Encounter: Payer: Self-pay | Admitting: Internal Medicine

## 2013-02-04 ENCOUNTER — Ambulatory Visit (INDEPENDENT_AMBULATORY_CARE_PROVIDER_SITE_OTHER): Payer: Medicaid Other | Admitting: Internal Medicine

## 2013-02-04 VITALS — BP 120/81 | HR 84 | Temp 98.2°F | Ht 62.0 in | Wt 219.0 lb

## 2013-02-04 DIAGNOSIS — L0390 Cellulitis, unspecified: Secondary | ICD-10-CM

## 2013-02-04 DIAGNOSIS — L0291 Cutaneous abscess, unspecified: Secondary | ICD-10-CM

## 2013-02-04 NOTE — Progress Notes (Signed)
  Subjective:    Patient ID: [name], female    DOB: 11-16-1975, 37 y.o.   MRN: [id number]  HPI He comes in for followup of her finger infection. She had an open wound with exposed bone and previously but now has closed. No fever, no chills. She is able to flex the finger a little more than before. No significant new pain in the area. No increase in the swelling, no redness or worsening pain. It has remained closed. She has been on Levaquin and Flagyl however found out she was pregnant and stopped Levaquin 3 days ago. She did complete a long course of antibiotics. Her next surgery appointment is today.  She has no complaints.   Review of Systems  Constitutional: Negative for fever and chills.  Gastrointestinal: Negative for diarrhea.  Musculoskeletal:       No significance swelling of the finger  Skin: Negative for rash.       Objective:   Physical Exam  Musculoskeletal:  Finger with some mild swelling that is similar to before but no erythema, no warmth, no drainage.          Assessment & Plan:

## 2013-02-04 NOTE — Assessment & Plan Note (Signed)
This difficult infection seems to have resolved. She has completed a long course of antibiotics and will stop today. She will return on a p.r.n. Basis.

## 2013-02-16 LAB — OB RESULTS CONSOLE RPR: RPR: NONREACTIVE

## 2013-02-16 LAB — OB RESULTS CONSOLE HEPATITIS B SURFACE ANTIGEN: Hepatitis B Surface Ag: NEGATIVE
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
## 2013-04-12 ENCOUNTER — Encounter (HOSPITAL_COMMUNITY): Payer: Self-pay | Admitting: Obstetrics and Gynecology

## 2013-04-20 ENCOUNTER — Encounter (HOSPITAL_COMMUNITY): Payer: Medicaid Other

## 2013-04-20 ENCOUNTER — Encounter (HOSPITAL_COMMUNITY): Payer: Self-pay

## 2013-04-20 ENCOUNTER — Ambulatory Visit (HOSPITAL_COMMUNITY)
Admission: RE | Admit: 2013-04-20 | Discharge: 2013-04-20 | Disposition: A | Discharge status: Home / Self Care | Payer: Medicaid Other | Source: Ambulatory Visit | Attending: Obstetrics and Gynecology | Admitting: Obstetrics and Gynecology

## 2013-04-20 VITALS — BP 113/47 | Wt 222.0 lb

## 2013-04-20 DIAGNOSIS — O24912 Unspecified diabetes mellitus in pregnancy, second trimester: Secondary | ICD-10-CM

## 2013-04-20 NOTE — Consult Note (Signed)
Maternal Fetal Medicine Consultation  Requesting Provider(s): Jaymes Graff, MD  Reason for consultation: Poorly controlled type I diabetes  HPI: [name] is a 37 yo G4P2012 currently at 29 5/7 weeks who was seen for consultation due to poorly controlled / non compliant type I diabetes (Class D).  She reports that she was diagnosed with diabetes at about age 23. She denies any known end-organ disease - never had DKA, denies retinopathy, known kidney disease or hypertension.  Her diabetes is currently managed by her primary care provider at the [facility].  She is currently on Novolin 70/30 25 units in the AM and 20 un in the PM.  She is currently only checking her fingerstick glucose values - fasting, after breakfast and before going to bed.  She uses Novolog sliding scale - but does not have any meal coverage. The patient's most recent HbA1C was 8.7.    The patient's past OB history is remarkable for a C-section in 2003 for ? Arrest of dilation and a scheduled repeat C-section in 2008.   PMH:  Past Medical History  Diagnosis Date  . Diabetes mellitus   . Urinary tract infection     hx of  . Diabetic neuropathy   . Arthritis   . Herpes virus disease   . Chronic back pain   . Hip fracture     left side  . Cataracts, bilateral   . Abscess 11/2012    RIGHT FINGER    PSH:  Past Surgical History  Procedure Laterality Date  . Fracture surgery      surgery left hip  . Cesarean section      x 2  . Cataract extraction      left  . Cataract extraction w/phaco  06/12/2012    Procedure: CATARACT EXTRACTION PHACO AND INTRAOCULAR LENS PLACEMENT (IOC);  Surgeon: [name], MD;  Location: [facility] OR;  Service: Ophthalmology;  Laterality: Right;  . Cataract extraction w/phaco  06/15/2012    Procedure: CATARACT EXTRACTION PHACO AND INTRAOCULAR LENS PLACEMENT (IOC);  Surgeon: [name], MD;  Location: [facility] OR;  Service: Ophthalmology;  Laterality: Left;  . I&d extremity Right  11/23/2012    Procedure: IRRIGATION AND DEBRIDEMENT EXTREMITY;  Surgeon: Jodi Marble, MD;  Location: [facility] OR;  Service: Orthopedics;  Laterality: Right;  . I&d extremity Right 11/30/2012    Procedure: IRRIGATION AND DEBRIDEMENT Right hand Abscess;  Surgeon: Jodi Marble, MD;  Location: [facility] OR;  Service: Orthopedics;  Laterality: Right;   Meds: Novolin 70/30, Novolog sliding scale, Vitamin D, PNV  Allergies:  Allergies  Allergen Reactions  . Phenergan (Promethazine Hcl)     Does not know reaction  . Tramadol     Unknown   FH: denies family history of birth defects or hereditary disorders  Soc: denies tobacco, ETOH use ore drug use  Review of Systems: no vaginal bleeding or cramping/contractions, no LOF, no nausea/vomiting. All other systems reviewed and are negative.   PE:   Filed Vitals:   04/20/13 1358  BP: 113/47     A/P: 1) Single IUP at 18 5/7 weeks         2) Poorly controlled, non compliant class D diabetes - We reviewed potential risks of poorly controlled diabetes in pregnancy to include stillbirth, fetal anomalies, fetal macrosomia, birth injury, neonatal hypoglycemia and other metabolic derangements.  The patient did not bring in her fingerstick logs for review.  I stressed the importance of tight glucose control during pregnancy to  avoid the above listed risks.    The current insulin regimen that Ms. Boehlke is using is a little unusual. I would normally avoid 70/30 insulin and would prefer NPH or Lantis and meal coverage with insulin aspart (Humolog).  I offered Ms. Sanluis a referral with the diabetic educator and she declined.  She is not interested in seeing an Endocrinologist and does not want to go on an insulin pump.  I stressed the need to check her fingerstick glucose values at least 4x daily - fasting and 2 hours post prandial.  I am hesitant to make any changes in her insulin regimen without knowing these values.  I gave the patient several glucose log  sheets - she promised that she will check her fingerstick values 4x daily and report them back to [place] in one week.  Recommend: 1) If patient is compliant with her fingerstick values over the next week and is poorly controlled (which I suspect), will change her insulin regimen to  NPH and Humolog to cover meals.  Using weight based doses - will start with 30 units NPH in AM; 12 units NPH in PM and 8 units Humolog with each meal.  She will need to call in her glucose values at least weekly thereafter. 2) Ultrasound for detailed anatomy and serial growth scans every 4-6 weeks (please contact our office if you would prefer that these procedures be scheduled here) 3) Fetal echo 4) If not already completed - recommend baseline labs to include 24-hr urine protein, creatinine clearance and CMP. 5) Would check HbA1C every trimester to gauge glucose control (particularly if the patient is not consistently checking her fingerstick values) 6) Delivery by 39 weeks   Thank you for the opportunity to be a part of the care of [employer]. Please contact our office if we can be of further assistance.   I spent approximately 30 minutes with this patient with over 50% of time spent in face-to-face counseling.  Alpha Gula, MD Maternal Fetal Medicine

## 2013-05-06 ENCOUNTER — Ambulatory Visit (HOSPITAL_COMMUNITY): Payer: Medicaid Other

## 2013-08-05 ENCOUNTER — Other Ambulatory Visit: Payer: Self-pay | Admitting: Obstetrics and Gynecology

## 2013-08-16 IMAGING — CT CT ANGIO CHEST
2 of 6 series · 19 of 46 positions shown · IV contrast (APPLIED)
Comparison: Chest radiograph today.

CLINICAL DATA: Short of breath.  Vascular access problems.

CT ANGIOGRAPHY CHEST
TECHNIQUE: Multidetector CT imaging of the chest using the
standard protocol during bolus administration of intravenous
contrast. Multiplanar reconstructed images including MIPs were
obtained and reviewed to evaluate the vascular anatomy.
Contrast: 100mL OMNIPAQUE IOHEXOL 350 MG/ML SOLN

[Series 10: pulm embolism 1.0 b25f thin · axial · 0.61mm/px · z∈[+1204,+1438]mm · 16 of 258 slices shown]
[im 12/258  lung]
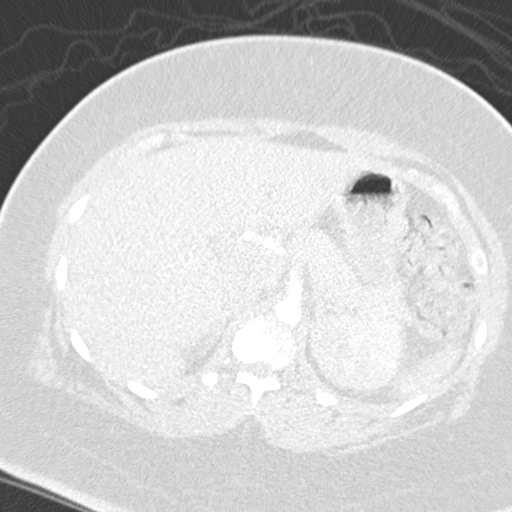
[im 34/258  soft-tissue]
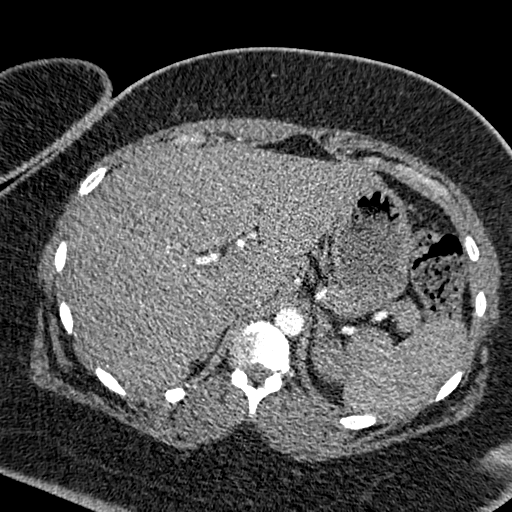
[im 45/258  lung]
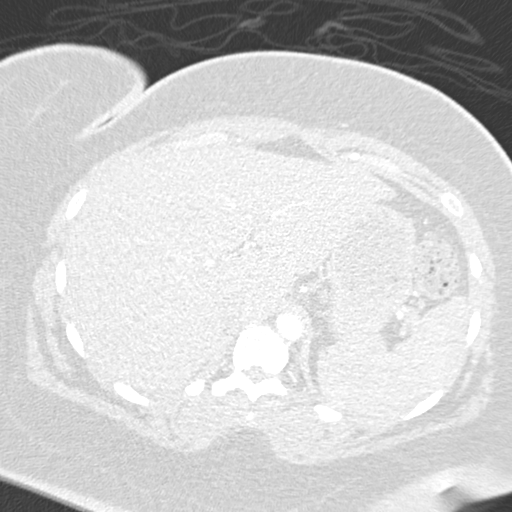
[im 56/258  soft-tissue]
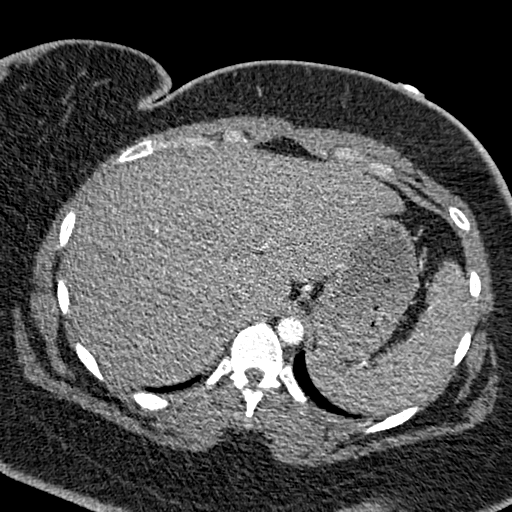
[im 79/258  lung]
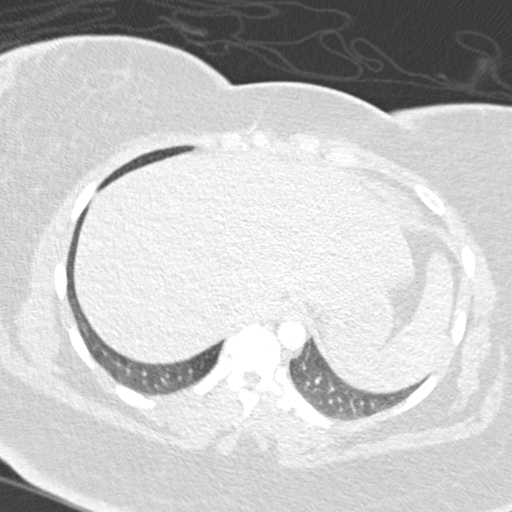
[im 90/258  soft-tissue]
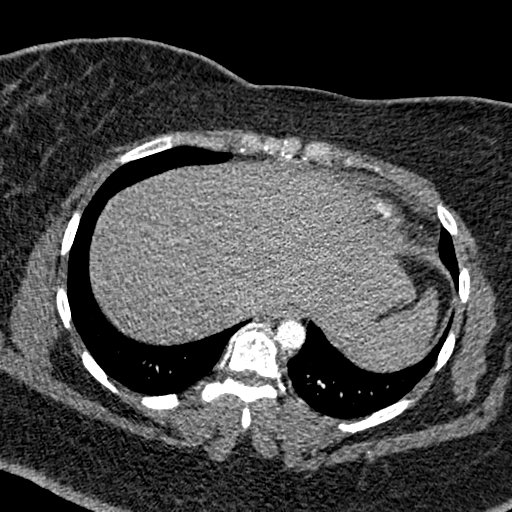
[im 101/258  lung]
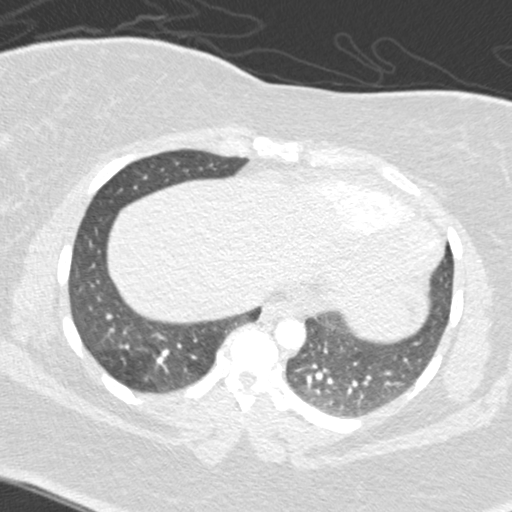
[im 123/258  soft-tissue]
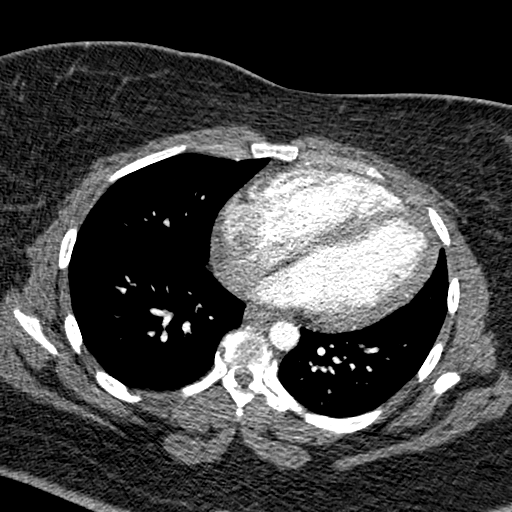
[im 135/258  lung]
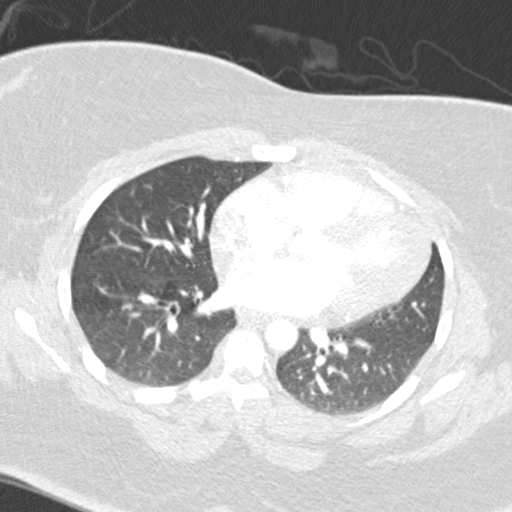
[im 157/258  soft-tissue]
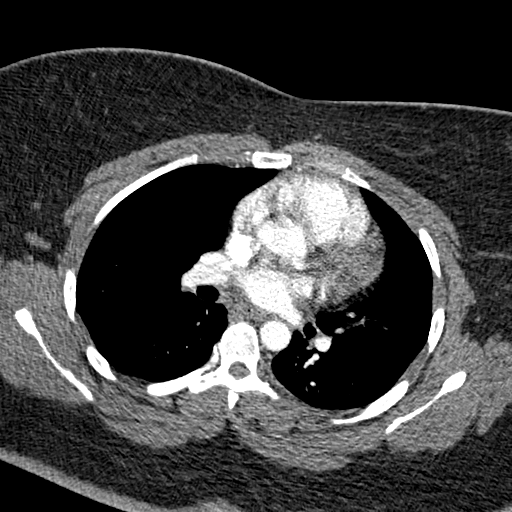
[im 168/258  lung]
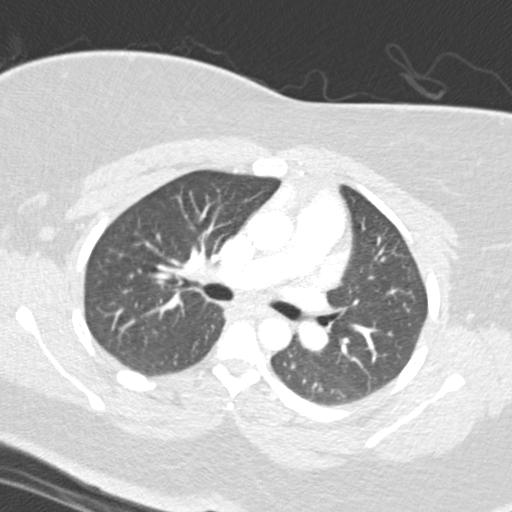
[im 179/258  soft-tissue]
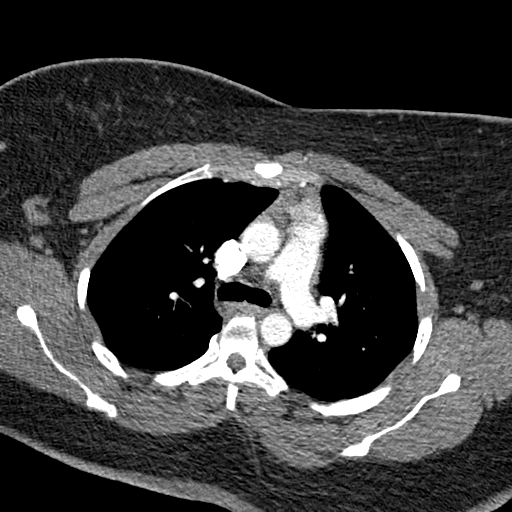
[im 202/258  lung]
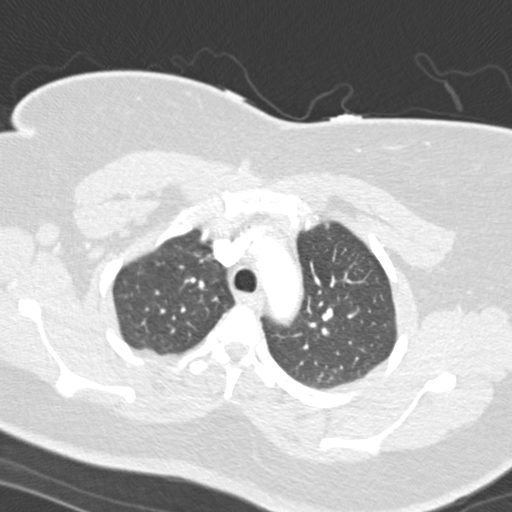
[im 213/258  soft-tissue]
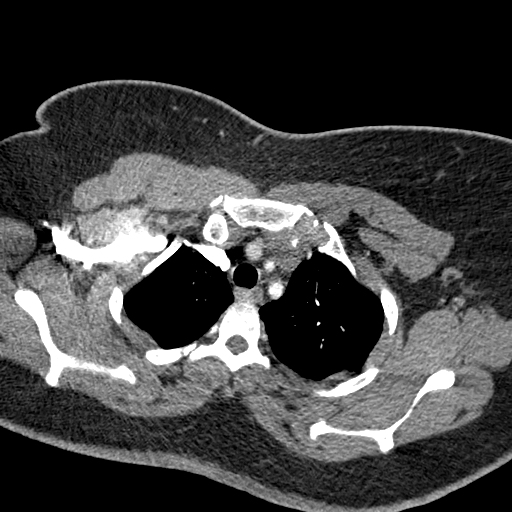
[im 224/258  lung]
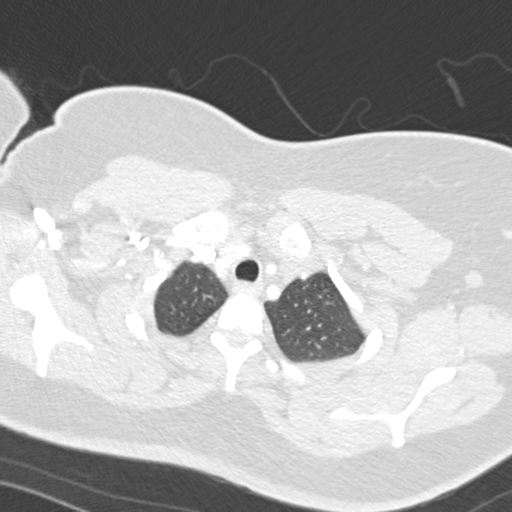
[im 246/258  soft-tissue]
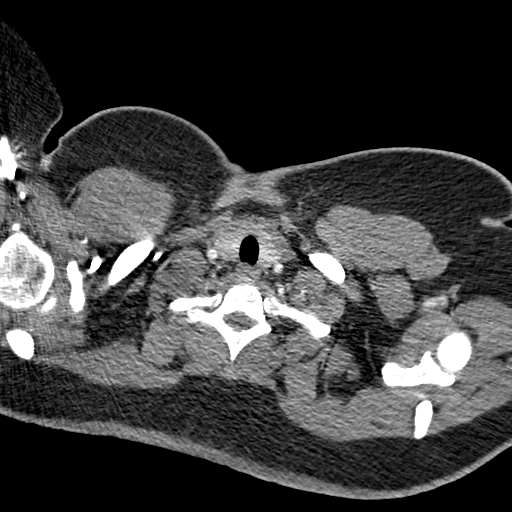

[Series 12: coronals · coronal · 0.70mm/px · 3 of 95 slices shown]
[im 24/95  soft-tissue]
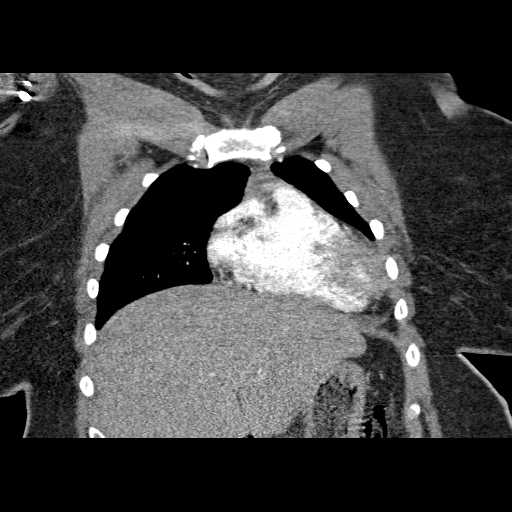
[im 48/95  soft-tissue]
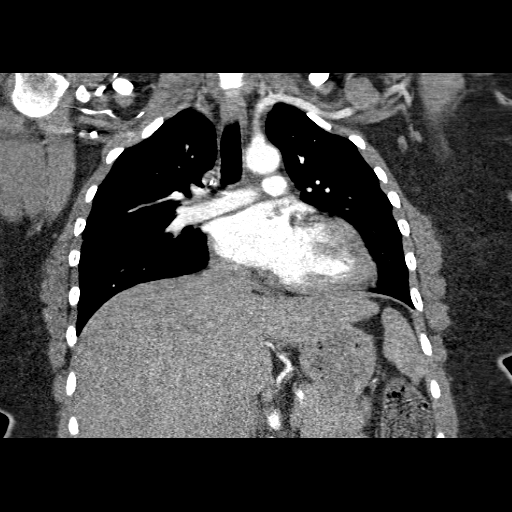
[im 71/95  soft-tissue]
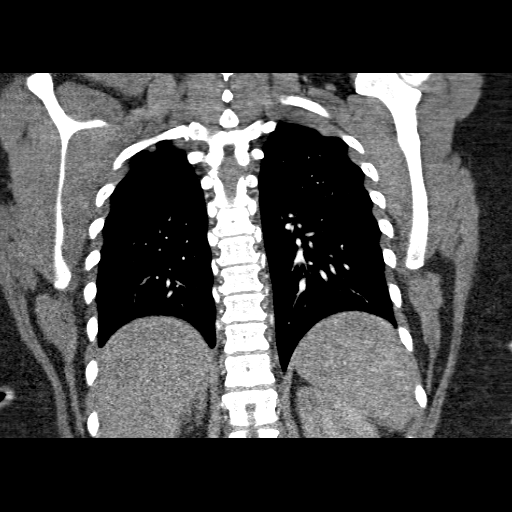

[19 of 46 positions shown; findings below may reference images not displayed]

FINDINGS: Technically adequate study without pulmonary embolism.
Left upper extremity PICC in the axilla not visualized.  Three-
vessel aortic arch.  No acute aortic abnormality.  Prominent
anterior mediastinal soft tissue, likely representing residual
thymus.  Incidental imaging the upper abdomen is within normal
limits.  There is no splenomegaly.  There is no mediastinal
adenopathy.  No pleural effusion.

There is an enlarged right axillary lymph node (image number 22
series 8) that measures 15 mm short axis.  This is nonspecific and
may be clinically palpable.  This could relate to an upper
extremity inflammatory process.

Bones appear within normal limits aside from mild thoracic
spondylosis.  Lungs show dependent atelectasis without airspace
disease.  Central airways are patent.
IMPRESSION: 1.  Negative for pulmonary embolus or acute aortic abnormality.
2.  Nonspecific enlarged single right axillary lymph node.  This
may be reactive to a right upper extremity process.
Lymphoproliferative disorder cannot be excluded.  No other
adenopathy is identified.  Prominent residual thymic tissue is
noted.

## 2013-09-03 ENCOUNTER — Encounter (HOSPITAL_COMMUNITY): Payer: Self-pay | Admitting: Pharmacist

## 2013-09-08 ENCOUNTER — Encounter (HOSPITAL_COMMUNITY)
Admission: RE | Admit: 2013-09-08 | Discharge: 2013-09-08 | Disposition: A | Discharge status: Home / Self Care | Payer: Medicaid Other | Source: Ambulatory Visit | Attending: Obstetrics and Gynecology | Admitting: Obstetrics and Gynecology

## 2013-09-08 ENCOUNTER — Encounter (HOSPITAL_COMMUNITY): Payer: Self-pay

## 2013-09-08 DIAGNOSIS — Z01812 Encounter for preprocedural laboratory examination: Secondary | ICD-10-CM | POA: Insufficient documentation

## 2013-09-08 LAB — BASIC METABOLIC PANEL
CO2: 22 mEq/L (ref 19–32)
Calcium: 8.4 mg/dL (ref 8.4–10.5)
Creatinine, Ser: 0.67 mg/dL (ref 0.50–1.10)
Glucose, Bld: 129 mg/dL — ABNORMAL HIGH (ref 70–99)

## 2013-09-08 LAB — CBC
HCT: 28.2 % — ABNORMAL LOW (ref 36.0–46.0)
Hemoglobin: 9.5 g/dL — ABNORMAL LOW (ref 12.0–15.0)
MCH: 26.2 pg (ref 26.0–34.0)
MCHC: 33.7 g/dL (ref 30.0–36.0)

## 2013-09-08 NOTE — Patient Instructions (Signed)
20 [name]  09/08/2013   Your procedure is scheduled on:  09/10/13  Enter through the Main Entrance of [facility] at 8 AM.  Pick up the phone at the desk and dial [phone].   Call this number if you have problems the morning of surgery: [phone]   Remember:   Do not eat food:After Midnight.  Do not drink clear liquids: After Midnight.  Take these medicines the morning of surgery with A SIP OF WATER: use 1/2 dose of bedtime insulin   Do not wear jewelry, make-up or nail polish.  Do not wear lotions, powders, or perfumes. You may wear deodorant.  Do not shave 48 hours prior to surgery.  Do not bring valuables to the hospital.  [facility] is not   responsible for any belongings or valuables brought to the hospital.  Contacts, dentures or bridgework may not be worn into surgery.  Leave suitcase in the car. After surgery it may be brought to your room.  For patients admitted to the hospital, checkout time is 11:00 AM the day of              discharge.   Patients discharged the day of surgery will not be allowed to drive             home.  Name and phone number of your driver: NA  Special Instructions:   Shower using CHG 2 nights before surgery and the night before surgery.  If you shower the day of surgery use CHG.  Use special wash - you have one bottle of CHG for all showers.  You should use approximately 1/3 of the bottle for each shower.   Please read over the following fact sheets that you were given:   Surgical Site Infection Prevention

## 2013-09-08 NOTE — Pre-Procedure Instructions (Signed)
K+ of 3 called to Cristela Blue, MD. No orders given.

## 2013-09-10 ENCOUNTER — Inpatient Hospital Stay (HOSPITAL_COMMUNITY)
Admission: RE | Admit: 2013-09-10 | Discharge: 2013-09-13 | DRG: 765 | Disposition: A | Discharge status: Home / Self Care | Payer: Medicaid Other | Source: Ambulatory Visit | Attending: Obstetrics and Gynecology | Admitting: Obstetrics and Gynecology

## 2013-09-10 ENCOUNTER — Encounter (HOSPITAL_COMMUNITY)
Admission: RE | Disposition: A | Discharge status: Home / Self Care | Payer: Self-pay | Source: Ambulatory Visit | Attending: Obstetrics and Gynecology

## 2013-09-10 ENCOUNTER — Encounter (HOSPITAL_COMMUNITY): Payer: Self-pay | Admitting: *Deleted

## 2013-09-10 ENCOUNTER — Encounter (HOSPITAL_COMMUNITY): Payer: Medicaid Other | Admitting: Anesthesiology

## 2013-09-10 ENCOUNTER — Inpatient Hospital Stay (HOSPITAL_COMMUNITY): Payer: Medicaid Other | Admitting: Anesthesiology

## 2013-09-10 DIAGNOSIS — D649 Anemia, unspecified: Secondary | ICD-10-CM | POA: Diagnosis not present

## 2013-09-10 DIAGNOSIS — O34219 Maternal care for unspecified type scar from previous cesarean delivery: Principal | ICD-10-CM | POA: Diagnosis present

## 2013-09-10 DIAGNOSIS — O9903 Anemia complicating the puerperium: Secondary | ICD-10-CM | POA: Diagnosis not present

## 2013-09-10 DIAGNOSIS — Z794 Long term (current) use of insulin: Secondary | ICD-10-CM

## 2013-09-10 DIAGNOSIS — E109 Type 1 diabetes mellitus without complications: Secondary | ICD-10-CM | POA: Diagnosis present

## 2013-09-10 DIAGNOSIS — O2432 Unspecified pre-existing diabetes mellitus in childbirth: Secondary | ICD-10-CM | POA: Diagnosis present

## 2013-09-10 DIAGNOSIS — Z22330 Carrier of Group B streptococcus: Secondary | ICD-10-CM

## 2013-09-10 DIAGNOSIS — Z302 Encounter for sterilization: Secondary | ICD-10-CM

## 2013-09-10 DIAGNOSIS — Z98891 History of uterine scar from previous surgery: Secondary | ICD-10-CM

## 2013-09-10 DIAGNOSIS — O99892 Other specified diseases and conditions complicating childbirth: Secondary | ICD-10-CM | POA: Diagnosis present

## 2013-09-10 LAB — PREPARE RBC (CROSSMATCH)

## 2013-09-10 LAB — GLUCOSE, CAPILLARY
Glucose-Capillary: 76 mg/dL (ref 70–99)
Glucose-Capillary: 120 mg/dL — ABNORMAL HIGH (ref 70–99)
Glucose-Capillary: 57 mg/dL — ABNORMAL LOW (ref 70–99)
Glucose-Capillary: 93 mg/dL (ref 70–99)

## 2013-09-10 SURGERY — Surgical Case
Anesthesia: Epidural | Site: Abdomen | Laterality: Bilateral

## 2013-09-10 MED ORDER — BUPIVACAINE IN DEXTROSE 0.75-8.25 % IT SOLN
INTRATHECAL | Status: DC | PRN
  Administered 2013-09-10: 1.3 mg via INTRATHECAL

## 2013-09-10 MED ORDER — INFLUENZA VAC SPLIT QUAD 0.5 ML IM SUSP
0.5000 mL | INTRAMUSCULAR | Status: AC
  Administered 2013-09-11: 0.5 mL via INTRAMUSCULAR

## 2013-09-10 MED ORDER — NALOXONE HCL 1 MG/ML IJ SOLN
1.0000 ug/kg/h | INTRAVENOUS | Status: DC | PRN
  Filled 2013-09-10: qty 2

## 2013-09-10 MED ORDER — NALOXONE HCL 0.4 MG/ML IJ SOLN: 0.4000 mg | INTRAMUSCULAR | Status: DC | PRN

## 2013-09-10 MED ORDER — LACTATED RINGERS IV BOLUS (SEPSIS)
500.0000 mL | Freq: Once | INTRAVENOUS | Status: AC
  Administered 2013-09-10: 500 mL via INTRAVENOUS

## 2013-09-10 MED ORDER — FENTANYL CITRATE 0.05 MG/ML IJ SOLN
INTRAMUSCULAR | Status: AC
  Filled 2013-09-10: qty 2

## 2013-09-10 MED ORDER — DIPHENHYDRAMINE HCL 50 MG/ML IJ SOLN: 25.0000 mg | INTRAMUSCULAR | Status: DC | PRN

## 2013-09-10 MED ORDER — MEPERIDINE HCL 25 MG/ML IJ SOLN: 6.2500 mg | INTRAMUSCULAR | Status: DC | PRN

## 2013-09-10 MED ORDER — ONDANSETRON HCL 4 MG/2ML IJ SOLN: 4.0000 mg | Freq: Three times a day (TID) | INTRAMUSCULAR | Status: DC | PRN

## 2013-09-10 MED ORDER — DIPHENHYDRAMINE HCL 25 MG PO CAPS: 25.0000 mg | ORAL_CAPSULE | ORAL | Status: DC | PRN

## 2013-09-10 MED ORDER — SIMETHICONE 80 MG PO CHEW
80.0000 mg | CHEWABLE_TABLET | ORAL | Status: DC
  Administered 2013-09-10 – 2013-09-13 (×3): 80 mg via ORAL
  Filled 2013-09-10 (×3): qty 1

## 2013-09-10 MED ORDER — KETOROLAC TROMETHAMINE 30 MG/ML IJ SOLN: 30.0000 mg | Freq: Four times a day (QID) | INTRAMUSCULAR | Status: AC | PRN

## 2013-09-10 MED ORDER — OXYCODONE-ACETAMINOPHEN 5-325 MG PO TABS
1.0000 | ORAL_TABLET | ORAL | Status: DC | PRN
  Administered 2013-09-11 – 2013-09-13 (×5): 1 via ORAL
  Filled 2013-09-10 (×5): qty 1

## 2013-09-10 MED ORDER — PRENATAL MULTIVITAMIN CH
1.0000 | ORAL_TABLET | Freq: Every day | ORAL | Status: DC
  Administered 2013-09-11 – 2013-09-13 (×3): 1 via ORAL
  Filled 2013-09-10 (×3): qty 1

## 2013-09-10 MED ORDER — CEFAZOLIN SODIUM-DEXTROSE 2-3 GM-% IV SOLR
2.0000 g | INTRAVENOUS | Status: AC
  Administered 2013-09-10: 2 g via INTRAVENOUS

## 2013-09-10 MED ORDER — SODIUM CHLORIDE 0.9 % IJ SOLN: 3.0000 mL | INTRAMUSCULAR | Status: DC | PRN

## 2013-09-10 MED ORDER — PHENYLEPHRINE HCL 10 MG/ML IJ SOLN
20.0000 mg | INTRAVENOUS | Status: DC | PRN
  Administered 2013-09-10: 60 ug/min via INTRAVENOUS

## 2013-09-10 MED ORDER — ONDANSETRON HCL 4 MG/2ML IJ SOLN
INTRAMUSCULAR | Status: DC | PRN
  Administered 2013-09-10: 4 mg via INTRAVENOUS

## 2013-09-10 MED ORDER — FENTANYL CITRATE 0.05 MG/ML IJ SOLN
INTRAMUSCULAR | Status: DC | PRN
  Administered 2013-09-10: 85 ug via INTRAVENOUS
  Administered 2013-09-10: 15 ug via INTRATHECAL

## 2013-09-10 MED ORDER — INSULIN ASPART 100 UNIT/ML ~~LOC~~ SOLN: 0.0000 [IU] | Freq: Three times a day (TID) | SUBCUTANEOUS | Status: DC

## 2013-09-10 MED ORDER — ONDANSETRON HCL 4 MG/2ML IJ SOLN
INTRAMUSCULAR | Status: AC
  Filled 2013-09-10: qty 2

## 2013-09-10 MED ORDER — CEFAZOLIN SODIUM-DEXTROSE 2-3 GM-% IV SOLR
INTRAVENOUS | Status: AC
  Filled 2013-09-10: qty 50

## 2013-09-10 MED ORDER — SCOPOLAMINE 1 MG/3DAYS TD PT72: 1.0000 | MEDICATED_PATCH | Freq: Once | TRANSDERMAL | Status: DC

## 2013-09-10 MED ORDER — NALBUPHINE HCL 10 MG/ML IJ SOLN
5.0000 mg | INTRAMUSCULAR | Status: DC | PRN
  Filled 2013-09-10: qty 1

## 2013-09-10 MED ORDER — IBUPROFEN 600 MG PO TABS
600.0000 mg | ORAL_TABLET | Freq: Four times a day (QID) | ORAL | Status: DC
  Administered 2013-09-10 – 2013-09-13 (×12): 600 mg via ORAL
  Filled 2013-09-10 (×11): qty 1

## 2013-09-10 MED ORDER — SODIUM BICARBONATE 8.4 % IV SOLN
INTRAVENOUS | Status: DC | PRN
  Administered 2013-09-10: 3 mL via EPIDURAL
  Administered 2013-09-10: 4 mL via EPIDURAL
  Administered 2013-09-10: 3 mL via EPIDURAL

## 2013-09-10 MED ORDER — ZOLPIDEM TARTRATE 5 MG PO TABS: 5.0000 mg | ORAL_TABLET | Freq: Every evening | ORAL | Status: DC | PRN

## 2013-09-10 MED ORDER — INSULIN NPH (HUMAN) (ISOPHANE) 100 UNIT/ML ~~LOC~~ SUSP
19.0000 [IU] | Freq: Every day | SUBCUTANEOUS | Status: DC
  Administered 2013-09-11: 19 [IU] via SUBCUTANEOUS

## 2013-09-10 MED ORDER — SCOPOLAMINE 1 MG/3DAYS TD PT72
1.0000 | MEDICATED_PATCH | Freq: Once | TRANSDERMAL | Status: DC
  Filled 2013-09-10: qty 1

## 2013-09-10 MED ORDER — MORPHINE SULFATE 10 MG/ML IJ SOLN
INTRAMUSCULAR | Status: DC | PRN
  Administered 2013-09-10: 4 mg via INTRAVENOUS

## 2013-09-10 MED ORDER — SENNOSIDES-DOCUSATE SODIUM 8.6-50 MG PO TABS
2.0000 | ORAL_TABLET | ORAL | Status: DC
  Administered 2013-09-10 – 2013-09-13 (×3): 2 via ORAL
  Filled 2013-09-10 (×3): qty 2

## 2013-09-10 MED ORDER — TETANUS-DIPHTH-ACELL PERTUSSIS 5-2.5-18.5 LF-MCG/0.5 IM SUSP
0.5000 mL | Freq: Once | INTRAMUSCULAR | Status: AC
  Administered 2013-09-11: 0.5 mL via INTRAMUSCULAR

## 2013-09-10 MED ORDER — SIMETHICONE 80 MG PO CHEW: 80.0000 mg | CHEWABLE_TABLET | ORAL | Status: DC | PRN

## 2013-09-10 MED ORDER — DIBUCAINE 1 % RE OINT: 1.0000 "application " | TOPICAL_OINTMENT | RECTAL | Status: DC | PRN

## 2013-09-10 MED ORDER — DIPHENHYDRAMINE HCL 25 MG PO CAPS
25.0000 mg | ORAL_CAPSULE | ORAL | Status: DC | PRN
  Filled 2013-09-10: qty 1

## 2013-09-10 MED ORDER — OXYTOCIN 10 UNIT/ML IJ SOLN
INTRAMUSCULAR | Status: AC
  Filled 2013-09-10: qty 4

## 2013-09-10 MED ORDER — SIMETHICONE 80 MG PO CHEW
80.0000 mg | CHEWABLE_TABLET | Freq: Three times a day (TID) | ORAL | Status: DC
  Administered 2013-09-11 – 2013-09-13 (×7): 80 mg via ORAL
  Filled 2013-09-10 (×8): qty 1

## 2013-09-10 MED ORDER — MORPHINE SULFATE 0.5 MG/ML IJ SOLN
INTRAMUSCULAR | Status: AC
  Filled 2013-09-10: qty 10

## 2013-09-10 MED ORDER — ONDANSETRON HCL 4 MG PO TABS: 4.0000 mg | ORAL_TABLET | ORAL | Status: DC | PRN

## 2013-09-10 MED ORDER — METOCLOPRAMIDE HCL 5 MG/ML IJ SOLN: 10.0000 mg | Freq: Three times a day (TID) | INTRAMUSCULAR | Status: DC | PRN

## 2013-09-10 MED ORDER — KETOROLAC TROMETHAMINE 60 MG/2ML IM SOLN
60.0000 mg | Freq: Once | INTRAMUSCULAR | Status: DC
  Administered 2013-09-10: 60 mg via INTRAMUSCULAR

## 2013-09-10 MED ORDER — FENTANYL CITRATE 0.05 MG/ML IJ SOLN: 25.0000 ug | INTRAMUSCULAR | Status: DC | PRN

## 2013-09-10 MED ORDER — ONDANSETRON HCL 4 MG/2ML IJ SOLN: 4.0000 mg | INTRAMUSCULAR | Status: DC | PRN

## 2013-09-10 MED ORDER — KETOROLAC TROMETHAMINE 60 MG/2ML IM SOLN: 60.0000 mg | Freq: Once | INTRAMUSCULAR | Status: DC | PRN

## 2013-09-10 MED ORDER — WITCH HAZEL-GLYCERIN EX PADS: 1.0000 "application " | MEDICATED_PAD | CUTANEOUS | Status: DC | PRN

## 2013-09-10 MED ORDER — SCOPOLAMINE 1 MG/3DAYS TD PT72
MEDICATED_PATCH | TRANSDERMAL | Status: AC
  Administered 2013-09-10: 1.5 mg
  Filled 2013-09-10: qty 1

## 2013-09-10 MED ORDER — MORPHINE SULFATE (PF) 0.5 MG/ML IJ SOLN
INTRAMUSCULAR | Status: DC | PRN
  Administered 2013-09-10: .1 ug via INTRATHECAL

## 2013-09-10 MED ORDER — LACTATED RINGERS IV SOLN
INTRAVENOUS | Status: DC
  Administered 2013-09-10 – 2013-09-11 (×2): via INTRAVENOUS

## 2013-09-10 MED ORDER — MENTHOL 3 MG MT LOZG: 1.0000 | LOZENGE | OROMUCOSAL | Status: DC | PRN

## 2013-09-10 MED ORDER — KETOROLAC TROMETHAMINE 60 MG/2ML IM SOLN
INTRAMUSCULAR | Status: AC
  Filled 2013-09-10: qty 2

## 2013-09-10 MED ORDER — LANOLIN HYDROUS EX OINT: 1.0000 "application " | TOPICAL_OINTMENT | CUTANEOUS | Status: DC | PRN

## 2013-09-10 MED ORDER — DIPHENHYDRAMINE HCL 50 MG/ML IJ SOLN: 12.5000 mg | INTRAMUSCULAR | Status: DC | PRN

## 2013-09-10 MED ORDER — OXYTOCIN 10 UNIT/ML IJ SOLN
40.0000 [IU] | INTRAVENOUS | Status: DC | PRN
  Administered 2013-09-10: 40 [IU] via INTRAVENOUS

## 2013-09-10 MED ORDER — OXYTOCIN 40 UNITS IN LACTATED RINGERS INFUSION - SIMPLE MED: 62.5000 mL/h | INTRAVENOUS | Status: AC

## 2013-09-10 MED ORDER — INSULIN NPH (HUMAN) (ISOPHANE) 100 UNIT/ML ~~LOC~~ SUSP
10.0000 [IU] | Freq: Every day | SUBCUTANEOUS | Status: DC
  Administered 2013-09-11: 10 [IU] via SUBCUTANEOUS
  Filled 2013-09-10: qty 10

## 2013-09-10 MED ORDER — PHENYLEPHRINE HCL 10 MG/ML IJ SOLN: 20.0000 mg | INTRAMUSCULAR | Status: DC | PRN

## 2013-09-10 MED ORDER — DEXTROSE IN LACTATED RINGERS 5 % IV SOLN
INTRAVENOUS | Status: DC
  Administered 2013-09-10 (×2): via INTRAVENOUS

## 2013-09-10 MED ORDER — BUPIVACAINE IN DEXTROSE 0.75-8.25 % IT SOLN
INTRATHECAL | Status: AC
  Filled 2013-09-10: qty 2

## 2013-09-10 MED ORDER — DIPHENHYDRAMINE HCL 25 MG PO CAPS: 25.0000 mg | ORAL_CAPSULE | Freq: Four times a day (QID) | ORAL | Status: DC | PRN

## 2013-09-10 MED ORDER — LACTATED RINGERS IV SOLN
INTRAVENOUS | Status: DC
  Administered 2013-09-10 (×2): via INTRAVENOUS

## 2013-09-10 SURGICAL SUPPLY — 47 items
APL SKNCLS STERI-STRIP NONHPOA (GAUZE/BANDAGES/DRESSINGS) ×1
BENZOIN TINCTURE PRP APPL 2/3 (GAUZE/BANDAGES/DRESSINGS) ×2 IMPLANT
CLAMP CORD UMBIL (MISCELLANEOUS) IMPLANT
CLOTH BEACON ORANGE TIMEOUT ST (SAFETY) ×2 IMPLANT
CONTAINER PREFILL 10% NBF 15ML (MISCELLANEOUS) IMPLANT
DRAPE LG THREE QUARTER DISP (DRAPES) IMPLANT
DRSG OPSITE POSTOP 4X10 (GAUZE/BANDAGES/DRESSINGS) ×2 IMPLANT
DURAPREP 26ML APPLICATOR (WOUND CARE) ×2 IMPLANT
ELECT REM PT RETURN 9FT ADLT (ELECTROSURGICAL) ×2
ELECTRODE REM PT RTRN 9FT ADLT (ELECTROSURGICAL) ×1 IMPLANT
EXTRACTOR VACUUM M CUP 4 TUBE (SUCTIONS) IMPLANT
GLOVE BIO SURGEON STRL SZ7.5 (GLOVE) ×5 IMPLANT
GLOVE BIOGEL PI IND STRL 7.5 (GLOVE) ×1 IMPLANT
GLOVE BIOGEL PI INDICATOR 7.5 (GLOVE) ×1
GOWN PREVENTION PLUS XLARGE (GOWN DISPOSABLE) ×2 IMPLANT
GOWN STRL NON-REIN LRG LVL3 (GOWN DISPOSABLE) ×2 IMPLANT
GOWN STRL REIN XL XLG (GOWN DISPOSABLE) ×2 IMPLANT
GOWN SURG XXL (GOWNS) ×1 IMPLANT
HEMOSTAT SURGICEL 2X14 (HEMOSTASIS) ×1 IMPLANT
KIT ABG SYR 3ML LUER SLIP (SYRINGE) IMPLANT
NDL HYPO 25X5/8 SAFETYGLIDE (NEEDLE) IMPLANT
NEEDLE HYPO 22GX1.5 SAFETY (NEEDLE) IMPLANT
NEEDLE HYPO 25X5/8 SAFETYGLIDE (NEEDLE) IMPLANT
NS IRRIG 1000ML POUR BTL (IV SOLUTION) ×2 IMPLANT
PACK C SECTION WH (CUSTOM PROCEDURE TRAY) ×2 IMPLANT
PAD OB MATERNITY 4.3X12.25 (PERSONAL CARE ITEMS) ×2 IMPLANT
RETRACTOR WND ALEXIS 25 LRG (MISCELLANEOUS) ×1 IMPLANT
RTRCTR WOUND ALEXIS 25CM LRG (MISCELLANEOUS) ×2
SPONGE LAP 18X18 X RAY DECT (DISPOSABLE) ×2 IMPLANT
STRIP CLOSURE SKIN 1/2X4 (GAUZE/BANDAGES/DRESSINGS) ×2 IMPLANT
STRIP CLOSURE SKIN 1/4X4 (GAUZE/BANDAGES/DRESSINGS) ×1 IMPLANT
SUT CHROMIC 2 0 CT 1 (SUTURE) ×2 IMPLANT
SUT CHROMIC 2 0 SH (SUTURE) ×1 IMPLANT
SUT MNCRL AB 3-0 PS2 27 (SUTURE) ×2 IMPLANT
SUT PLAIN 0 NONE (SUTURE) IMPLANT
SUT PLAIN 2 0 XLH (SUTURE) ×2 IMPLANT
SUT VIC AB 0 CT1 36 (SUTURE) ×2 IMPLANT
SUT VIC AB 0 CTX 36 (SUTURE) ×6
SUT VIC AB 0 CTX36XBRD ANBCTRL (SUTURE) ×3 IMPLANT
SUT VIC AB 2-0 SH 27 (SUTURE) ×6
SUT VIC AB 2-0 SH 27XBRD (SUTURE) IMPLANT
SUT VIC AB 3-0 SH 27 (SUTURE) ×2
SUT VIC AB 3-0 SH 27X BRD (SUTURE) IMPLANT
SYR CONTROL 10ML LL (SYRINGE) IMPLANT
TOWEL OR 17X24 6PK STRL BLUE (TOWEL DISPOSABLE) ×2 IMPLANT
TRAY FOLEY CATH 14FR (SET/KITS/TRAYS/PACK) ×2 IMPLANT
WATER STERILE IRR 1000ML POUR (IV SOLUTION) ×2 IMPLANT

## 2013-09-10 NOTE — Anesthesia Procedure Notes (Signed)
Spinal  Patient location during procedure: OR Start time: 09/10/2013 10:07 AM Staffing Performed by: anesthesiologist  Preanesthetic Checklist Completed: patient identified, site marked, surgical consent, pre-op evaluation, timeout performed, IV checked, risks and benefits discussed and monitors and equipment checked Spinal Block Patient position: sitting Prep: site prepped and draped and DuraPrep Patient monitoring: heart rate, cardiac monitor, continuous pulse ox and blood pressure Approach: midline Location: L3-4 Injection technique: single-shot Needle Needle type: Pencan and Tuohy  Needle gauge: 24 G Needle length: 12.7 cm Needle insertion depth: 8 cm Catheter type: closed end flexible Catheter size: 19 g Catheter at skin depth: 16 cm Assessment Sensory level: T4 Additional Notes LOR to air at 8 cm on first pass.  No paresthesia.  Pencan passed via tuohy with immediate return of clear free flow CSF.  SAB dose given.  Pencan removed, tuohy flushed with 2 ml saline and epidural catheter threaded to 13 cm at skin.  Pt then positioned in RLD and catheter secured at 16 cm at skin after skin and sub-q allowed to retract catheter.  Patient then positioned supine for case.  Patient tolerated procedure well with no apparent complications.  Jasmine December, MD

## 2013-09-10 NOTE — Transfer of Care (Signed)
Immediate Anesthesia Transfer of Care Note  Patient: [name]  Procedure(s) Performed: Procedure(s): REPEAT CESAREAN SECTION WITH BILATERAL TUBAL LIGATION (Bilateral)  Patient Location: PACU  Anesthesia Type:Spinal and Epidural  Level of Consciousness: awake, alert  and oriented  Airway & Oxygen Therapy: Patient Spontanous Breathing  Post-op Assessment: Report given to PACU RN and Post -op Vital signs reviewed and stable  Post vital signs: Reviewed and stable  Complications: No apparent anesthesia complications

## 2013-09-10 NOTE — Anesthesia Postprocedure Evaluation (Signed)
  Anesthesia Post-op Note  Anesthesia Post Note  Patient: [name]  Procedure(s) Performed: Procedure(s) (LRB): REPEAT CESAREAN SECTION WITH BILATERAL TUBAL LIGATION (Bilateral)  Anesthesia type: Spinal/Epidural  Patient location: PACU  Post pain: Pain level controlled  Post assessment: Post-op Vital signs reviewed  Last Vitals:  Filed Vitals:   09/10/13 1230  BP: 117/71  Pulse: 94  Temp:   Resp: 19    Post vital signs: Reviewed  Level of consciousness: awake  Complications: Patient given intrathecal morphine with spinal and then given additional dose of duramorph via epidural towards end of case.  Will monitor closely for respiratory depression.  Jasmine December, MD

## 2013-09-10 NOTE — H&P (Addendum)
[name] is a 37 y.o. female presenting for repeat c/s and sterilization.  She is a poorly controlled type 1 DM and denies LOF, ctxs and VB.  Currently on 38u NPH qam and 20u NPH qhs and 10u regular with meals.  History OB History   Grav Para Term Preterm Abortions TAB SAB Ect Mult Living   4 2   1     2      Past Medical History  Diagnosis Date  . Diabetes mellitus   . Urinary tract infection     hx of  . Diabetic neuropathy   . Arthritis   . Herpes virus disease   . Chronic back pain   . Hip fracture     left side  . Cataracts, bilateral   . Abscess 11/2012    RIGHT FINGER   Past Surgical History  Procedure Laterality Date  . Fracture surgery      surgery left hip  . Cesarean section      x 2  . Cataract extraction      left  . Cataract extraction w/phaco  06/12/2012    Procedure: CATARACT EXTRACTION PHACO AND INTRAOCULAR LENS PLACEMENT (IOC);  Surgeon: [name], MD;  Location: [facility] OR;  Service: Ophthalmology;  Laterality: Right;  . Cataract extraction w/phaco  06/15/2012    Procedure: CATARACT EXTRACTION PHACO AND INTRAOCULAR LENS PLACEMENT (IOC);  Surgeon: [name], MD;  Location: [facility] OR;  Service: Ophthalmology;  Laterality: Left;  . I&d extremity Right 11/23/2012    Procedure: IRRIGATION AND DEBRIDEMENT EXTREMITY;  Surgeon: Jodi Marble, MD;  Location: [facility] OR;  Service: Orthopedics;  Laterality: Right;  . I&d extremity Right 11/30/2012    Procedure: IRRIGATION AND DEBRIDEMENT Right hand Abscess;  Surgeon: Jodi Marble, MD;  Location: [facility] OR;  Service: Orthopedics;  Laterality: Right;   Family History: family history is not on file. Social History:  reports that she has never smoked. She has never used smokeless tobacco. She reports that she does not drink alcohol or use illicit drugs. Allergies: phenergan and tramadol  Prenatal Transfer Tool  Maternal Diabetes: Yes:  Diabetes Type:  Pre-pregnancy Genetic Screening: Normal Maternal  Ultrasounds/Referrals: Normal Fetal Ultrasounds or other Referrals:  Fetal echo wnl per pt Maternal Substance Abuse:  No Significant Maternal Medications:  Meds include: Other: insulin Significant Maternal Lab Results:  Lab values include: Group B Strep positive Other Comments:  poorly controlled type 1 DM (last hgb A1C 8.7)  ROS Non-contributory   Blood pressure 132/77, pulse 90, temperature 97.7 F (36.5 C), temperature source Oral, resp. rate 18, last menstrual period 11/21/2012, SpO2 100.00%. Exam Physical Exam  Lungs CTA CV RRR ABD gravid, NT Pelvic deferred FHR 147  Prenatal labs: ABO, Rh: --/--/A POS, A POS (12/24 1215) Antibody: NEG (12/24 1215) Rubella: Immune (06/03 1253) RPR: NON REACTIVE (12/24 1215)  HBsAg: Negative (06/03 1253)  HIV: Non-reactive (06/03 1253)  GBS:   positive  Assessment/Plan: P2 at 39 1/7wks with poorly controlled type 1 DM desiring permanent sterilization and repeat c-section is scheduled for repeat c/s and tubal ligation.  Fetal status is overall reassuring.  Manie Bealer Y 09/10/2013, 9:40 AM

## 2013-09-10 NOTE — Anesthesia Preprocedure Evaluation (Signed)
Anesthesia Evaluation  Patient identified by MRN, date of birth, ID band Patient awake    Reviewed: Allergy & Precautions, H&P , Patient's Chart, lab work & pertinent test results  Airway Mallampati: II TM Distance: >3 FB Neck ROM: full    Dental no notable dental hx.    Pulmonary  breath sounds clear to auscultation  Pulmonary exam normal       Cardiovascular Exercise Tolerance: Good Rhythm:regular Rate:Normal     Neuro/Psych    GI/Hepatic   Endo/Other  diabetes, Type 1, Insulin DependentMorbid obesity  Renal/GU      Musculoskeletal   Abdominal   Peds  Hematology  (+) anemia ,   Anesthesia Other Findings Good airway No Hx of DKA Hx of CS x2; Anemic; will T&C x2 BS this am was 76, pt took 10 units insulin  Reproductive/Obstetrics                           Anesthesia Physical Anesthesia Plan  ASA: II  Anesthesia Plan: Spinal, Combined Spinal and Epidural and Epidural   Post-op Pain Management:    Induction:   Airway Management Planned:   Additional Equipment:   Intra-op Plan:   Post-operative Plan:   Informed Consent: I have reviewed the patients History and Physical, chart, labs and discussed the procedure including the risks, benefits and alternatives for the proposed anesthesia with the patient or authorized representative who has indicated his/her understanding and acceptance.   Dental Advisory Given  Plan Discussed with: CRNA  Anesthesia Plan Comments: (Lab work confirmed with CRNA in room. Platelets okay. Discussed spinal anesthetic, and patient consents to the procedure:  included risk of possible headache,backache, failed block, allergic reaction, and nerve injury. This patient was asked if she had any questions or concerns before the procedure started. )        Anesthesia Quick Evaluation

## 2013-09-10 NOTE — Op Note (Addendum)
Cesarean Section Procedure Note  Indications: 39 1/7 wks with poorly controlled type 1 DM desiring repeat c-section and sterilization   Pre-operative Diagnosis: 1.Prior Cesarean Section 2.Desire for Sterilization 3.Type 1 DM   Post-operative Diagnosis: 1.Prior Cesarean Section 2.Desire for Sterilization 3.Type 1 DM  Procedure: REPEAT LOW TRANSVERSE CESAREAN SECTION WITH BILATERAL SALPINGECTOMY  Surgeon: Purcell Nails, MD    Assistants: Nigel Bridgeman, CNM  Anesthesia: Spinal  Anesthesiologist: Dana Allan, MD   Procedure Details  The patient was taken to the operating room after the risks, benefits, complications, treatment options, and expected outcomes were discussed with the patient.  The patient concurred with the proposed plan, giving informed consent which was signed and witnessed. The patient was taken to Operating Room C-Section Suite, identified as [name] and the procedure verified as C-Section Delivery and Sterilization. A Time Out was held and the above information confirmed.  After induction of anesthesia by obtaining a surgical level via the spinal, the patient was prepped and draped in the usual sterile manner. A Pfannenstiel skin incision was made and carried down through the subcutaneous tissue to the underlying layer of fascia.  The fascia was incised bilaterally and extended transversely bilaterally with the Mayo scissors. Kocher clamps were placed on the inferior aspect of the fascial incision and the underlying rectus muscle was separated from the fascia. The same was done on the superior aspect of the fascial incision.  Dense scar tissue was noted.  The peritoneum was identified, entered with a hemostat and extended sharply and manually. An Alexis self-retaining retractor was placed.  The utero-vesical peritoneal reflection was incised transversely and the bladder flap was minimally sharply freed from the lower uterine segment. A low transverse uterine incision  was made with the scalpel and extended bilaterally with the bandage scissors.  The infant was delivered in vertex position without difficulty. After the umbilical cord was clamped and cut, the infant was handed to the awaiting pediatricians.  Cord blood was obtained for evaluation.  The placenta was removed intact and appeared to be within normal limits. The uterus was cleared of all clots and debris. The uterine incision was closed with running interlocking sutures of 0 Vicryl and a second imbricating layer was performed as well.   Bilateral tubes and ovaries appeared to be within normal limits.  Good hemostasis was noted.  Copious irrigation was performed until clear.  The uterus was exteriorized and the left fallopian tube grasped in the midportion with a babcock after carrying it out to its fimbriated end.  Kelly clamps were placed beneath the tube along its length and the tube excised.  The remaining pedicle was then stitched with 2-0 vicryl along its length and the existing pedicle sutured once again with 2-0 vicryl.  Good hemostasis was noted.  The same was done on the contralateral side.  There was small amount of oozing noted from the left pedicle upon re-inspection and another stitch of 2-0 vicryl was placed for hemostasis.  Surgicel was applied to all pedicles as well as the primary uterine incision and a small oozer on the anterior wall of uterus.  The peritoneum was repaired with 2-0 chromic via a running suture lysing some omental adhesions to anterior abdominal wall.  The omentum was doubly clamped, cut and ligated with 2-0 plain.  The fascia was reapproximated with a running suture of 0 Vicryl. The subcutaneous tissue was reapproximated with 5 interrupted sutures of 2-0 plain.  The skin was reapproximated with a subcuticular suture of  3-0 monocryl.  Steristrips were applied.  Instrument, sponge, and needle counts were correct prior to abdominal closure and at the conclusion of the case.  The  patient was awaiting transfer to the recovery room in good condition.  Findings: Live female infant with Apgars 9 at one minute and 9 at five minutes.  Normal appearing bilateral ovaries and fallopian tubes were noted.  Estimated Blood Loss:  1000 ml         Drains: foley to gravity 200 ml         Total IV Fluids: 1600 ml         Specimens to Pathology: Placenta and Bilateral Fallopian Tubes         Complications:  None; patient tolerated the procedure well.         Disposition: PACU - hemodynamically stable.         Condition: stable  Attending Attestation: I performed the procedure.

## 2013-09-11 LAB — COMPREHENSIVE METABOLIC PANEL
ALT: 7 U/L (ref 0–35)
Albumin: 2.1 g/dL — ABNORMAL LOW (ref 3.5–5.2)
Alkaline Phosphatase: 61 U/L (ref 39–117)
BUN: 9 mg/dL (ref 6–23)
Calcium: 8.4 mg/dL (ref 8.4–10.5)
GFR calc Af Amer: >90 mL/min (ref >90)
GFR calc non Af Amer: >90 mL/min (ref >90)
Potassium: 3.5 mEq/L (ref 3.5–5.1)
Sodium: 133 mEq/L — ABNORMAL LOW (ref 135–145)
Total Protein: 5.1 g/dL — ABNORMAL LOW (ref 6.0–8.3)

## 2013-09-11 LAB — CBC
Hemoglobin: 8.2 g/dL — ABNORMAL LOW (ref 12.0–15.0)
MCH: 25.8 pg — ABNORMAL LOW (ref 26.0–34.0)
MCHC: 33.5 g/dL (ref 30.0–36.0)
MCV: 77 fL — ABNORMAL LOW (ref 78.0–100.0)
Platelets: 203 10*3/uL (ref 150–400)
RBC: 3.18 MIL/uL — ABNORMAL LOW (ref 3.87–5.11)
RDW: 14.8 % (ref 11.5–15.5)

## 2013-09-11 LAB — GLUCOSE, CAPILLARY
Glucose-Capillary: 93 mg/dL (ref 70–99)
Glucose-Capillary: 116 mg/dL — ABNORMAL HIGH (ref 70–99)
Glucose-Capillary: 123 mg/dL — ABNORMAL HIGH (ref 70–99)
Glucose-Capillary: 97 mg/dL (ref 70–99)
Glucose-Capillary: 96 mg/dL (ref 70–99)

## 2013-09-11 MED ORDER — LACTATED RINGERS IV SOLN
INTRAVENOUS | Status: AC
  Administered 2013-09-11: 01:00:00 via INTRAVENOUS

## 2013-09-11 MED ORDER — INSULIN ASPART 100 UNIT/ML ~~LOC~~ SOLN
0.0000 [IU] | Freq: Three times a day (TID) | SUBCUTANEOUS | Status: DC
  Administered 2013-09-11: 2 [IU] via SUBCUTANEOUS

## 2013-09-11 NOTE — Plan of Care (Signed)
Problem: Discharge Progression Outcomes Goal: Activity appropriate for discharge plan Outcome: Completed/Met Date Met:  09/11/13 Patient ambulates back and forth to NICU and tolerates it very well.

## 2013-09-11 NOTE — Progress Notes (Signed)
Clinical Social Work Department PSYCHOSOCIAL ASSESSMENT - MATERNAL/CHILD 09/11/2013  Patient:  [name], [name]  Account Number:  [id number]  Admit Date:  09/10/2013  Marjo Bicker Name:   Beverly Milch    Clinical Social Worker:  Yamina Lenis, LCSW   Date/Time:  09/11/2013 09:00 AM  Date Referred:  09/11/2013   Referral source  NICU     Referred reason  NICU   Other referral source:    I:  FAMILY / HOME ENVIRONMENT Child's legal guardian:  PARENT  Guardian - Name Guardian - Age Guardian - Address  Jandreau,Aarian [address]  [place], [place] [address]  Dell Ponto     Other household support members/support persons Other support:    II  PSYCHOSOCIAL DATA Information Source:    Event organiser Employment:   Father is employed   Surveyor, quantity resources:  [employer]:   Other  [employer]  Forensic psychologist / Grade:   Maternity Care Coordinator / Child Services Coordination / Early Interventions:  Cultural issues impacting care:    III  STRENGTHS Strengths  Supportive family/friends  Home prepared for Child (including basic supplies)  Adequate Resources   Strength comment:    IV  RISK FACTORS AND CURRENT PROBLEMS Current Problem:       V  SOCIAL WORK ASSESSMENT Met with mother who was pleasant and receptive to social work intervention.  She is a single parent with two other dependent ages 55 and 98.  Father is reportedly employed and very supportive.    Mother seems to be coping well with newborn NICU admission.  Informed that they have spoken with the medical team.  Mother states that she was initially worried about newborn having diabetes because of her strong family hx of diabetes and being diagnosed at such a young age.   Allowed mother to share her concerns. Provided supportive feedback.  She communicate hopes that infant will be ready to go home when she's discharged. She reports no transportation  issues should newborn have to remain in the NICU.  Mother denies any hx of substance abuse or mental illness.  No acute social concerns related at this time.  CSW will follow PRN.      VI SOCIAL WORK PLAN Social Work Plan  Psychosocial Support/Ongoing Assessment of Needs    Pattiann Solanki J, LCSW

## 2013-09-11 NOTE — Progress Notes (Signed)
Subjective: Postpartum Day 1: Repeat Cesarean Delivery/BTL.  H/o uncontrolled Type 1 Diabetes, Anemia (EBL 1000) Patient reports she has no pain.  Bleeding is minimal.  Tolerating a regular diet.  Wants to pump while in the hospital and give baby colostrum but is not producing as much as anticipated.  Has seen the lactation consultant.  +Ambulation.  Initially was lightheaded but that resolved. Baby doing well in NICU, blood sugar back up.    Objective: Vital signs in last 24 hours: Temp:  [97.4 F (36.3 C)-99.1 F (37.3 C)] 98.7 F (37.1 C) (12/27 1006) Pulse Rate:  [72-95] 72 (12/27 1006) Resp:  [10-20] 20 (12/27 1006) BP: (64-121)/(50-75) 108/66 mmHg (12/27 1006) SpO2:  [98 %-100 %] 99 % (12/27 1006)  Physical Exam:  General: alert, cooperative and no distress Lochia: appropriate Uterine Fundus: Difficult to assess but feels firm Incision: Honeycomb with 2 small areas of BRB DVT Evaluation: No evidence of DVT seen on physical exam.   Recent Labs  09/08/13 1215 09/11/13 0505  HGB 9.5* 8.2*  HCT 28.2* 24.5*    CBG:  Fasting 93, 2 hr PP Breakfast 116   Assessment/Plan: Status post Cesarean section. Doing well postoperatively.  Continue current care. Pain controlled. Type 1 DM- Blood sugar well controlled.  Continue halved dosage of prenatal insulin with SSI.  Continue CBGs Fasting, 2 postprandial meals. Anemia.  No longer with signs of anemia.  Iron supplementation.  Geryl Rankins 09/11/2013, 11:57 AM

## 2013-09-11 NOTE — Anesthesia Postprocedure Evaluation (Signed)
  Anesthesia Post-op Note  Patient: [name]  Procedure(s) Performed: Procedure(s): REPEAT CESAREAN SECTION WITH BILATERAL TUBAL LIGATION (Bilateral)  Patient Location: PACU and Women's Unit  Anesthesia Type:Spinal and Epidural  Level of Consciousness: awake, alert  and oriented  Airway and Oxygen Therapy: Patient Spontanous Breathing  Post-op Pain: mild  Post-op Assessment: Patient's Cardiovascular Status Stable, Respiratory Function Stable, No signs of Nausea or vomiting, Adequate PO intake and Pain level controlled  Post-op Vital Signs: stable  Complications: No apparent anesthesia complications

## 2013-09-12 LAB — TYPE AND SCREEN
Unit division: 0
Unit division: 0

## 2013-09-12 LAB — GLUCOSE, CAPILLARY
Glucose-Capillary: 76 mg/dL (ref 70–99)
Glucose-Capillary: 107 mg/dL — ABNORMAL HIGH (ref 70–99)

## 2013-09-12 NOTE — Lactation Note (Signed)
This note was copied from the chart of Erika Inette Doubrava. Lactation Consultation Note: follow up visit with mom. She reports that she has pumped 3 times today but did not obtain any Colostrum. Encouraged to pump q 3 hours 8 times/day to promote a good milk supply. Mom states she was going to try to get some for the baby but plans to bottle feed formula. No questions at present. To call prn.  Patient Name: [name] [id number] Date: 09/12/2013 Reason for consult: Follow-up assessment   Maternal Data    Feeding Feeding Type: Bottle Fed - Formula Nipple Type: Slow - flow Length of feed: 15 min  LATCH Score/Interventions                      Lactation Tools Discussed/Used     Consult Status Consult Status: PRN    Pamelia Hoit 09/12/2013, 3:05 PM

## 2013-09-12 NOTE — Progress Notes (Signed)
Hypoglycemic Event  CBG:45  Treatment: 15 GM carbohydrate snack  Symptoms: None  Follow-up CBG: Time:0820 CBG Result:  44  Possible Reasons for Event: Unknown  Comments/MD notified:Jennifer Oxley, CNM notified of values.  She will discuss with Dr. Levora Angel.    Herby Abraham [facility]  Remember to initiate Hypoglycemia Order Set & complete

## 2013-09-12 NOTE — Progress Notes (Addendum)
Subjective: Postpartum Day 2: Repeat Cesarean Delivery/BTL.  H/o uncontrolled Type 1 Diabetes, Anemia (EBL 1000) Pt without complaints. Given NPH 10 units 13 hours ago.  Hypoglycemic this morning, mid 40s. Pt was asymptomatic. Ate peanut butter and crackers and CBG 71.  Ate full breakfast consisting of orange juice, grits, toast, sausage, eggs.  CBG 2 hour postprandial was 96. Pt states she has had diabetes Type 1 since she was 10.  Has failed pills and states "my body is insulin dependant."  Has refused an insulin pump in the past and during this pregnancy.  Admits that her blood sugar was not well controlled prior to pregnancy.  Did not modify her diet at all.  States she will try to eat a diabetic diet and then will "fall off."  Objective: Vital signs in last 24 hours: Temp:  [97.6 F (36.4 C)-98.2 F (36.8 C)] 98.2 F (36.8 C) (12/28 0526) Pulse Rate:  [77-80] 77 (12/28 0526) Resp:  [16-20] 16 (12/28 0526) BP: (108-127)/(44-60) 108/44 mmHg (12/28 0526) SpO2:  [100 %] 100 % (12/28 0526)  Physical Exam:  General: alert, cooperative and no distress Abdomen:  Nontender. Incision: Honeycomb with 2 small areas of old blood from yesterday, unchanged. DVT Evaluation: No evidence of DVT seen on physical exam, no calf tenderness.   Recent Labs  09/11/13 0505  HGB 8.2*  HCT 24.5*    CBG:  Fasting 43, 44, after PB/crackers 71, 2 hr PP Breakfast 96   Assessment/Plan: Status post Cesarean section. Doing well postoperatively.  Type 1 DM since age 37.  Last dose of insulin NPH 10 units 13 hours ago and blood sugar normal postprandial.  Suspect pt is likely Insulin resistant as opposed to lack of insulin production from pancreas.  Will check AC lunch and postprandial.  Will cover with SSI insulin if slightly elevated.  Continue diabetic diet.  Consider inpatient medicine prn.  Primary ob will resume care tomorrow. Likely more familiar with her previous regimens and will have more input on how  to manage pt.  Plan discussed with pt.  Nyonna Hargrove 09/12/2013, 37 11:18 AM

## 2013-09-13 ENCOUNTER — Encounter (HOSPITAL_COMMUNITY): Payer: Self-pay | Admitting: Obstetrics and Gynecology

## 2013-09-13 LAB — GLUCOSE, CAPILLARY: Glucose-Capillary: 116 mg/dL — ABNORMAL HIGH (ref 70–99)

## 2013-09-13 MED ORDER — OXYCODONE-ACETAMINOPHEN 5-325 MG PO TABS: 1.0000 | ORAL_TABLET | Freq: Four times a day (QID) | ORAL | Status: DC | PRN

## 2013-09-13 MED ORDER — IBUPROFEN 600 MG PO TABS: 600.0000 mg | ORAL_TABLET | Freq: Four times a day (QID) | ORAL | Status: DC | PRN

## 2013-09-13 NOTE — Progress Notes (Signed)
Pt discharged home with significant other. Condition stable.  Pt ambulated to NICU with plans to pick-up her baby, scheduled for discharge, and leave hospital from there.  No equipment for home ordered at discharge.  Pt plans to formula feed baby after discharge.

## 2013-09-13 NOTE — Discharge Summary (Addendum)
Obstetric Discharge Summary Reason for Admission: repeat csection and sterilization Prenatal Procedures: ultrasound Intrapartum Procedures: repeat low transverse c-sectin with bilateral salpingectomy Postpartum Procedures: checking CBGs Complications-Operative and Postpartum: CBGs drop low and on no insulin following diabetic diet pt required no insulin for greater than 36hrs.  Will d/c home without insulin and have pt return to office in 48hrs to have CBGs reviewed. Hemoglobin  Date Value Range Status  09/11/2013 8.2* 12.0 - 15.0 g/dL Final     HCT  Date Value Range Status  09/11/2013 24.5* 36.0 - 46.0 % Final   Plans bottle feeding and desires discharge today.  Plans outpt circ.  Will come to office on Wed to have CBGs reviewed.  Physical Exam:  General: alert and no distress Lochia: appropriate Uterine Fundus: firm Incision: healing well and dressing intact and dry, mildly stained DVT Evaluation: No evidence of DVT seen on physical exam.  Discharge Diagnoses: s/p repeat c/s and BS  Discharge Information: Date: 09/13/2013 Activity: pelvic rest and as tolerated Diet: carb modified Medications: Ibuprofen and Percocet Condition: stable Instructions: refer to practice specific booklet Discharge to: home Follow-up Information   Follow up with Purcell Nails, MD In 6 weeks. (for post partum visit and on Wed 12/31 to have CBGs reviewed)    Specialty:  Obstetrics and Gynecology   Contact information:   [address] [name] [place] [address] [phone]       Newborn Data: Live born female  Birth Weight: 7 lb 9.3 oz (3440 g) APGAR: 9, 9  Home with mother although baby was in NICU for hypoglycemia.  [name] 09/13/2013, 11:42 AM

## 2013-11-06 ENCOUNTER — Emergency Department (HOSPITAL_COMMUNITY): Payer: Medicaid Other

## 2013-11-06 ENCOUNTER — Emergency Department (HOSPITAL_COMMUNITY)
Admission: EM | Admit: 2013-11-06 | Discharge: 2013-11-06 | Disposition: A | Discharge status: Home / Self Care | Payer: Medicaid Other | Attending: Emergency Medicine | Admitting: Emergency Medicine

## 2013-11-06 ENCOUNTER — Encounter (HOSPITAL_COMMUNITY): Payer: Self-pay | Admitting: Emergency Medicine

## 2013-11-06 DIAGNOSIS — E1149 Type 2 diabetes mellitus with other diabetic neurological complication: Secondary | ICD-10-CM | POA: Insufficient documentation

## 2013-11-06 DIAGNOSIS — M25559 Pain in unspecified hip: Secondary | ICD-10-CM

## 2013-11-06 DIAGNOSIS — Z8669 Personal history of other diseases of the nervous system and sense organs: Secondary | ICD-10-CM | POA: Insufficient documentation

## 2013-11-06 DIAGNOSIS — Z872 Personal history of diseases of the skin and subcutaneous tissue: Secondary | ICD-10-CM | POA: Insufficient documentation

## 2013-11-06 DIAGNOSIS — M25569 Pain in unspecified knee: Secondary | ICD-10-CM | POA: Insufficient documentation

## 2013-11-06 DIAGNOSIS — E1142 Type 2 diabetes mellitus with diabetic polyneuropathy: Secondary | ICD-10-CM | POA: Insufficient documentation

## 2013-11-06 DIAGNOSIS — M549 Dorsalgia, unspecified: Secondary | ICD-10-CM | POA: Insufficient documentation

## 2013-11-06 DIAGNOSIS — G8929 Other chronic pain: Secondary | ICD-10-CM | POA: Insufficient documentation

## 2013-11-06 DIAGNOSIS — Z87440 Personal history of urinary (tract) infections: Secondary | ICD-10-CM | POA: Insufficient documentation

## 2013-11-06 DIAGNOSIS — Z8619 Personal history of other infectious and parasitic diseases: Secondary | ICD-10-CM | POA: Insufficient documentation

## 2013-11-06 DIAGNOSIS — Z8781 Personal history of (healed) traumatic fracture: Secondary | ICD-10-CM | POA: Insufficient documentation

## 2013-11-06 DIAGNOSIS — M129 Arthropathy, unspecified: Secondary | ICD-10-CM | POA: Insufficient documentation

## 2013-11-06 LAB — CBG MONITORING, ED: GLUCOSE-CAPILLARY: 95 mg/dL (ref 70–99)

## 2013-11-06 MED ORDER — IBUPROFEN 800 MG PO TABS: 800.0000 mg | ORAL_TABLET | Freq: Three times a day (TID) | ORAL | Status: DC | PRN

## 2013-11-06 MED ORDER — OXYCODONE-ACETAMINOPHEN 5-325 MG PO TABS: 1.0000 | ORAL_TABLET | Freq: Four times a day (QID) | ORAL | Status: DC | PRN

## 2013-11-06 MED ORDER — MORPHINE SULFATE 4 MG/ML IJ SOLN
6.0000 mg | Freq: Once | INTRAMUSCULAR | Status: AC
  Administered 2013-11-06: 6 mg via INTRAMUSCULAR
  Filled 2013-11-06: qty 2

## 2013-11-06 MED ORDER — KETOROLAC TROMETHAMINE 60 MG/2ML IM SOLN
60.0000 mg | Freq: Once | INTRAMUSCULAR | Status: AC
  Administered 2013-11-06: 60 mg via INTRAMUSCULAR
  Filled 2013-11-06: qty 2
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
## 2013-11-06 NOTE — ED Notes (Signed)
Per pt, hip surgery in 2006-states increased pain, states she is unable to walk
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
## 2013-11-06 NOTE — Discharge Instructions (Signed)
Return here as needed. Follow up with your orthopedist. Your x-rays did not show any new findings.

## 2013-11-07 NOTE — ED Provider Notes (Signed)
History/physical exam/procedure(s) were performed by non-physician practitioner and as supervising physician I was immediately available for consultation/collaboration. I have reviewed all notes and am in agreement with care and plan.   Eris Hannan S Juda Lajeunesse, MD 11/07/13 0018 

## 2014-07-18 ENCOUNTER — Encounter (HOSPITAL_COMMUNITY): Payer: Self-pay | Admitting: Emergency Medicine

## 2014-09-14 ENCOUNTER — Other Ambulatory Visit (HOSPITAL_COMMUNITY): Payer: Self-pay | Admitting: Orthopedic Surgery

## 2014-10-04 ENCOUNTER — Inpatient Hospital Stay (HOSPITAL_COMMUNITY)
Admission: RE | Admit: 2014-10-04 | Discharge: 2014-10-04 | Disposition: A | Discharge status: Home / Self Care | Payer: Medicaid Other | Source: Ambulatory Visit

## 2014-10-07 ENCOUNTER — Inpatient Hospital Stay (HOSPITAL_COMMUNITY): Admission: RE | Admit: 2014-10-07 | Payer: Medicaid Other | Source: Ambulatory Visit

## 2014-10-11 ENCOUNTER — Encounter (HOSPITAL_COMMUNITY): Payer: Self-pay | Admitting: *Deleted

## 2014-10-11 MED ORDER — CHLORHEXIDINE GLUCONATE 4 % EX LIQD
60.0000 mL | Freq: Once | CUTANEOUS | Status: DC
  Filled 2014-10-11: qty 60

## 2014-10-11 MED ORDER — MUPIROCIN 2 % EX OINT
1.0000 "application " | TOPICAL_OINTMENT | Freq: Once | CUTANEOUS | Status: AC
  Administered 2014-10-12: 1 via TOPICAL
  Filled 2014-10-11: qty 22

## 2014-10-11 MED ORDER — CEFAZOLIN SODIUM-DEXTROSE 2-3 GM-% IV SOLR
2.0000 g | INTRAVENOUS | Status: AC
  Administered 2014-10-12: 3 g via INTRAVENOUS
  Filled 2014-10-11: qty 50

## 2014-10-11 NOTE — Progress Notes (Signed)
Ms [name] reports that fasting blood sugars run "100-150, last A1c was drawn 3 weeks ago and was 7.7."  Diabetes [facility] managed at [facility] on [address].  I faxed a request for labs, last office visit and EKG.

## 2014-10-12 ENCOUNTER — Inpatient Hospital Stay (HOSPITAL_COMMUNITY)
Admission: RE | Admit: 2014-10-12 | Discharge: 2014-10-15 | DRG: 470 | Disposition: A | Discharge status: Home Health | Payer: Medicaid Other | Source: Ambulatory Visit | Attending: Orthopedic Surgery | Admitting: Orthopedic Surgery

## 2014-10-12 ENCOUNTER — Inpatient Hospital Stay (HOSPITAL_COMMUNITY): Payer: Medicaid Other | Admitting: Anesthesiology

## 2014-10-12 ENCOUNTER — Encounter (HOSPITAL_COMMUNITY): Payer: Self-pay | Admitting: Anesthesiology

## 2014-10-12 ENCOUNTER — Inpatient Hospital Stay (HOSPITAL_COMMUNITY): Payer: Medicaid Other

## 2014-10-12 ENCOUNTER — Encounter (HOSPITAL_COMMUNITY)
Admission: RE | Disposition: A | Discharge status: Home Health | Payer: Self-pay | Source: Ambulatory Visit | Attending: Orthopedic Surgery

## 2014-10-12 DIAGNOSIS — M12552 Traumatic arthropathy, left hip: Secondary | ICD-10-CM | POA: Diagnosis present

## 2014-10-12 DIAGNOSIS — M87852 Other osteonecrosis, left femur: Secondary | ICD-10-CM | POA: Diagnosis present

## 2014-10-12 DIAGNOSIS — Z791 Long term (current) use of non-steroidal anti-inflammatories (NSAID): Secondary | ICD-10-CM

## 2014-10-12 DIAGNOSIS — Z79899 Other long term (current) drug therapy: Secondary | ICD-10-CM | POA: Diagnosis not present

## 2014-10-12 DIAGNOSIS — M549 Dorsalgia, unspecified: Secondary | ICD-10-CM | POA: Diagnosis present

## 2014-10-12 DIAGNOSIS — Z7982 Long term (current) use of aspirin: Secondary | ICD-10-CM | POA: Diagnosis not present

## 2014-10-12 DIAGNOSIS — E1140 Type 2 diabetes mellitus with diabetic neuropathy, unspecified: Secondary | ICD-10-CM | POA: Diagnosis present

## 2014-10-12 DIAGNOSIS — I951 Orthostatic hypotension: Secondary | ICD-10-CM | POA: Diagnosis not present

## 2014-10-12 DIAGNOSIS — Z794 Long term (current) use of insulin: Secondary | ICD-10-CM | POA: Diagnosis not present

## 2014-10-12 DIAGNOSIS — M25552 Pain in left hip: Secondary | ICD-10-CM | POA: Diagnosis present

## 2014-10-12 DIAGNOSIS — Z96649 Presence of unspecified artificial hip joint: Secondary | ICD-10-CM

## 2014-10-12 DIAGNOSIS — G8929 Other chronic pain: Secondary | ICD-10-CM | POA: Diagnosis present

## 2014-10-12 DIAGNOSIS — D62 Acute posthemorrhagic anemia: Secondary | ICD-10-CM | POA: Diagnosis not present

## 2014-10-12 HISTORY — DX: Personal history of other medical treatment: Z92.89

## 2014-10-12 HISTORY — DX: Reserved for inherently not codable concepts without codable children: IMO0001

## 2014-10-12 HISTORY — PX: TOTAL HIP ARTHROPLASTY: SHX124

## 2014-10-12 LAB — GLUCOSE, CAPILLARY
GLUCOSE-CAPILLARY: 137 mg/dL — AB (ref 70–99)
GLUCOSE-CAPILLARY: 165 mg/dL — AB (ref 70–99)
GLUCOSE-CAPILLARY: 204 mg/dL — AB (ref 70–99)
Glucose-Capillary: 223 mg/dL — ABNORMAL HIGH (ref 70–99)
Glucose-Capillary: 231 mg/dL — ABNORMAL HIGH (ref 70–99)
Glucose-Capillary: 239 mg/dL — ABNORMAL HIGH (ref 70–99)

## 2014-10-12 LAB — HCG, SERUM, QUALITATIVE: Preg, Serum: NEGATIVE

## 2014-10-12 LAB — COMPREHENSIVE METABOLIC PANEL
ALT: 12 U/L (ref 0–35)
ANION GAP: 6 (ref 5–15)
AST: 24 U/L (ref 0–37)
Albumin: 3.6 g/dL (ref 3.5–5.2)
Alkaline Phosphatase: 57 U/L (ref 39–117)
BUN: 10 mg/dL (ref 6–23)
CO2: 26 mmol/L (ref 19–32)
Calcium: 9 mg/dL (ref 8.4–10.5)
Chloride: 103 mmol/L (ref 96–112)
Creatinine, Ser: 0.83 mg/dL (ref 0.50–1.10)
GFR calc Af Amer: >90 mL/min (ref >90)
GFR, EST NON AFRICAN AMERICAN: 88 mL/min — AB (ref >90)
Glucose, Bld: 152 mg/dL — ABNORMAL HIGH (ref 70–99)
Potassium: 4.1 mmol/L (ref 3.5–5.1)
SODIUM: 135 mmol/L (ref 135–145)
Total Bilirubin: 0.4 mg/dL (ref 0.3–1.2)
Total Protein: 7.6 g/dL (ref 6.0–8.3)

## 2014-10-12 LAB — PROTIME-INR
INR: 1.07 (ref 0.00–1.49)
Prothrombin Time: 14 seconds (ref 11.6–15.2)

## 2014-10-12 LAB — CBC
HEMATOCRIT: 34.2 % — AB (ref 36.0–46.0)
Hemoglobin: 11.2 g/dL — ABNORMAL LOW (ref 12.0–15.0)
MCH: 25.9 pg — AB (ref 26.0–34.0)
MCHC: 32.7 g/dL (ref 30.0–36.0)
MCV: 79.2 fL (ref 78.0–100.0)
Platelets: 280 10*3/uL (ref 150–400)
RBC: 4.32 MIL/uL (ref 3.87–5.11)
RDW: 13.7 % (ref 11.5–15.5)
WBC: 8.9 10*3/uL (ref 4.0–10.5)

## 2014-10-12 LAB — APTT: APTT: 29 s (ref 24–37)

## 2014-10-12 LAB — SURGICAL PCR SCREEN
MRSA, PCR: NEGATIVE
Staphylococcus aureus: NEGATIVE

## 2014-10-12 SURGERY — ARTHROPLASTY, HIP, TOTAL,POSTERIOR APPROACH
Anesthesia: General | Site: Hip | Laterality: Left

## 2014-10-12 MED ORDER — GLYCOPYRROLATE 0.2 MG/ML IJ SOLN
INTRAMUSCULAR | Status: AC
  Filled 2014-10-12: qty 2

## 2014-10-12 MED ORDER — HYDROMORPHONE HCL 1 MG/ML IJ SOLN: 1.0000 mg | INTRAMUSCULAR | Status: DC | PRN

## 2014-10-12 MED ORDER — ACETAMINOPHEN 325 MG PO TABS
650.0000 mg | ORAL_TABLET | Freq: Four times a day (QID) | ORAL | Status: DC | PRN
  Administered 2014-10-13 – 2014-10-14 (×2): 650 mg via ORAL
  Filled 2014-10-12 (×2): qty 2

## 2014-10-12 MED ORDER — STERILE WATER FOR INJECTION IJ SOLN
INTRAMUSCULAR | Status: AC
  Filled 2014-10-12: qty 10

## 2014-10-12 MED ORDER — MAGNESIUM CITRATE PO SOLN: 1.0000 | Freq: Once | ORAL | Status: AC | PRN

## 2014-10-12 MED ORDER — DOCUSATE SODIUM 100 MG PO CAPS
100.0000 mg | ORAL_CAPSULE | Freq: Two times a day (BID) | ORAL | Status: DC
  Administered 2014-10-12 – 2014-10-15 (×6): 100 mg via ORAL
  Filled 2014-10-12 (×7): qty 1

## 2014-10-12 MED ORDER — ROCURONIUM BROMIDE 100 MG/10ML IV SOLN
INTRAVENOUS | Status: DC | PRN
  Administered 2014-10-12: 10 mg via INTRAVENOUS
  Administered 2014-10-12: 30 mg via INTRAVENOUS

## 2014-10-12 MED ORDER — ONDANSETRON HCL 4 MG PO TABS: 4.0000 mg | ORAL_TABLET | Freq: Four times a day (QID) | ORAL | Status: DC | PRN

## 2014-10-12 MED ORDER — KETOROLAC TROMETHAMINE 15 MG/ML IJ SOLN
15.0000 mg | Freq: Four times a day (QID) | INTRAMUSCULAR | Status: AC
  Administered 2014-10-12 – 2014-10-13 (×4): 15 mg via INTRAVENOUS
  Filled 2014-10-12 (×3): qty 1

## 2014-10-12 MED ORDER — ACETAMINOPHEN 650 MG RE SUPP: 650.0000 mg | Freq: Four times a day (QID) | RECTAL | Status: DC | PRN

## 2014-10-12 MED ORDER — LIRAGLUTIDE 18 MG/3ML ~~LOC~~ SOPN: 0.6000 mL | PEN_INJECTOR | Freq: Every day | SUBCUTANEOUS | Status: DC

## 2014-10-12 MED ORDER — FENTANYL CITRATE 0.05 MG/ML IJ SOLN
INTRAMUSCULAR | Status: DC | PRN
  Administered 2014-10-12 (×2): 100 ug via INTRAVENOUS
  Administered 2014-10-12 (×2): 50 ug via INTRAVENOUS

## 2014-10-12 MED ORDER — LIDOCAINE HCL (CARDIAC) 20 MG/ML IV SOLN
INTRAVENOUS | Status: DC | PRN
  Administered 2014-10-12: 100 mg via INTRAVENOUS

## 2014-10-12 MED ORDER — METHOCARBAMOL 1000 MG/10ML IJ SOLN
500.0000 mg | Freq: Four times a day (QID) | INTRAVENOUS | Status: DC | PRN
  Filled 2014-10-12: qty 5

## 2014-10-12 MED ORDER — OXYCODONE HCL 5 MG PO TABS
5.0000 mg | ORAL_TABLET | ORAL | Status: DC | PRN
  Administered 2014-10-12 – 2014-10-15 (×7): 10 mg via ORAL
  Filled 2014-10-12 (×7): qty 2

## 2014-10-12 MED ORDER — NEOSTIGMINE METHYLSULFATE 10 MG/10ML IV SOLN
INTRAVENOUS | Status: DC | PRN
Start: 2014-10-12 — End: 2014-10-12
  Administered 2014-10-12: 3 mg via INTRAVENOUS

## 2014-10-12 MED ORDER — HYDROMORPHONE HCL 1 MG/ML IJ SOLN
0.2500 mg | INTRAMUSCULAR | Status: DC | PRN
  Administered 2014-10-12 (×2): 0.5 mg via INTRAVENOUS

## 2014-10-12 MED ORDER — ONDANSETRON HCL 4 MG/2ML IJ SOLN
INTRAMUSCULAR | Status: DC | PRN
  Administered 2014-10-12: 4 mg via INTRAVENOUS

## 2014-10-12 MED ORDER — SUCCINYLCHOLINE CHLORIDE 20 MG/ML IJ SOLN
INTRAMUSCULAR | Status: AC
  Filled 2014-10-12: qty 1

## 2014-10-12 MED ORDER — ROCURONIUM BROMIDE 50 MG/5ML IV SOLN
INTRAVENOUS | Status: AC
  Filled 2014-10-12: qty 1

## 2014-10-12 MED ORDER — ALUM & MAG HYDROXIDE-SIMETH 200-200-20 MG/5ML PO SUSP: 30.0000 mL | ORAL | Status: DC | PRN

## 2014-10-12 MED ORDER — SODIUM CHLORIDE 0.9 % IR SOLN
Status: DC | PRN
  Administered 2014-10-12: 1000 mL

## 2014-10-12 MED ORDER — CEFAZOLIN SODIUM-DEXTROSE 2-3 GM-% IV SOLR
2.0000 g | Freq: Four times a day (QID) | INTRAVENOUS | Status: AC
  Administered 2014-10-12 (×2): 2 g via INTRAVENOUS
  Filled 2014-10-12 (×3): qty 50

## 2014-10-12 MED ORDER — MENTHOL 3 MG MT LOZG: 1.0000 | LOZENGE | OROMUCOSAL | Status: DC | PRN

## 2014-10-12 MED ORDER — SODIUM CHLORIDE 0.9 % IV SOLN: INTRAVENOUS | Status: DC

## 2014-10-12 MED ORDER — ASPIRIN EC 325 MG PO TBEC: 325.0000 mg | DELAYED_RELEASE_TABLET | Freq: Every day | ORAL | Status: AC

## 2014-10-12 MED ORDER — HYDROMORPHONE HCL 1 MG/ML IJ SOLN
INTRAMUSCULAR | Status: AC
Start: 2014-10-12 — End: 2014-10-12
  Administered 2014-10-12: 0.5 mg via INTRAVENOUS
  Filled 2014-10-12: qty 1

## 2014-10-12 MED ORDER — ASPIRIN EC 325 MG PO TBEC
325.0000 mg | DELAYED_RELEASE_TABLET | Freq: Every day | ORAL | Status: DC
  Administered 2014-10-13 – 2014-10-15 (×3): 325 mg via ORAL
  Filled 2014-10-12 (×5): qty 1

## 2014-10-12 MED ORDER — FERROUS SULFATE 325 (65 FE) MG PO TABS
325.0000 mg | ORAL_TABLET | Freq: Three times a day (TID) | ORAL | Status: DC
  Administered 2014-10-12 – 2014-10-15 (×8): 325 mg via ORAL
  Filled 2014-10-12 (×11): qty 1

## 2014-10-12 MED ORDER — BISACODYL 5 MG PO TBEC: 5.0000 mg | DELAYED_RELEASE_TABLET | Freq: Every day | ORAL | Status: DC | PRN

## 2014-10-12 MED ORDER — METOCLOPRAMIDE HCL 5 MG/ML IJ SOLN: 5.0000 mg | Freq: Three times a day (TID) | INTRAMUSCULAR | Status: DC | PRN

## 2014-10-12 MED ORDER — ZOLPIDEM TARTRATE 5 MG PO TABS
5.0000 mg | ORAL_TABLET | Freq: Every evening | ORAL | Status: DC | PRN
  Administered 2014-10-13: 5 mg via ORAL
  Filled 2014-10-12: qty 1

## 2014-10-12 MED ORDER — INSULIN ASPART 100 UNIT/ML ~~LOC~~ SOLN
4.0000 [IU] | Freq: Three times a day (TID) | SUBCUTANEOUS | Status: DC
  Administered 2014-10-13 – 2014-10-15 (×5): 4 [IU] via SUBCUTANEOUS

## 2014-10-12 MED ORDER — LIDOCAINE HCL 4 % MT SOLN
OROMUCOSAL | Status: DC | PRN
  Administered 2014-10-12: 4 mL via TOPICAL

## 2014-10-12 MED ORDER — PHENOL 1.4 % MT LIQD: 1.0000 | OROMUCOSAL | Status: DC | PRN

## 2014-10-12 MED ORDER — METOCLOPRAMIDE HCL 10 MG PO TABS: 5.0000 mg | ORAL_TABLET | Freq: Three times a day (TID) | ORAL | Status: DC | PRN

## 2014-10-12 MED ORDER — PROPOFOL 10 MG/ML IV BOLUS
INTRAVENOUS | Status: AC
  Filled 2014-10-12: qty 20

## 2014-10-12 MED ORDER — GLYCOPYRROLATE 0.2 MG/ML IJ SOLN
INTRAMUSCULAR | Status: DC | PRN
  Administered 2014-10-12: 0.4 mg via INTRAVENOUS

## 2014-10-12 MED ORDER — FENTANYL CITRATE 0.05 MG/ML IJ SOLN
INTRAMUSCULAR | Status: AC
  Filled 2014-10-12: qty 5

## 2014-10-12 MED ORDER — ARTIFICIAL TEARS OP OINT
TOPICAL_OINTMENT | OPHTHALMIC | Status: AC
  Filled 2014-10-12: qty 3.5

## 2014-10-12 MED ORDER — EPHEDRINE SULFATE 50 MG/ML IJ SOLN
INTRAMUSCULAR | Status: AC
  Filled 2014-10-12: qty 1

## 2014-10-12 MED ORDER — PHENYLEPHRINE 40 MCG/ML (10ML) SYRINGE FOR IV PUSH (FOR BLOOD PRESSURE SUPPORT)
PREFILLED_SYRINGE | INTRAVENOUS | Status: AC
  Filled 2014-10-12: qty 10

## 2014-10-12 MED ORDER — LACTATED RINGERS IV SOLN
INTRAVENOUS | Status: DC | PRN
  Administered 2014-10-12 (×2): via INTRAVENOUS

## 2014-10-12 MED ORDER — SENNOSIDES-DOCUSATE SODIUM 8.6-50 MG PO TABS: 1.0000 | ORAL_TABLET | Freq: Every evening | ORAL | Status: DC | PRN

## 2014-10-12 MED ORDER — METHOCARBAMOL 500 MG PO TABS
500.0000 mg | ORAL_TABLET | Freq: Four times a day (QID) | ORAL | Status: DC | PRN
  Administered 2014-10-12 – 2014-10-15 (×5): 500 mg via ORAL
  Filled 2014-10-12 (×5): qty 1

## 2014-10-12 MED ORDER — INSULIN ASPART 100 UNIT/ML ~~LOC~~ SOLN
0.0000 [IU] | Freq: Three times a day (TID) | SUBCUTANEOUS | Status: DC
  Administered 2014-10-12: 5 [IU] via SUBCUTANEOUS
  Administered 2014-10-13 – 2014-10-14 (×4): 3 [IU] via SUBCUTANEOUS
  Administered 2014-10-14: 5 [IU] via SUBCUTANEOUS
  Administered 2014-10-14: 2 [IU] via SUBCUTANEOUS
  Administered 2014-10-15: 3 [IU] via SUBCUTANEOUS

## 2014-10-12 MED ORDER — INSULIN DETEMIR 100 UNIT/ML ~~LOC~~ SOLN
25.0000 [IU] | Freq: Every day | SUBCUTANEOUS | Status: DC
  Administered 2014-10-12 – 2014-10-15 (×4): 25 [IU] via SUBCUTANEOUS
  Filled 2014-10-12 (×5): qty 0.25

## 2014-10-12 MED ORDER — LIDOCAINE HCL (CARDIAC) 20 MG/ML IV SOLN
INTRAVENOUS | Status: AC
  Filled 2014-10-12: qty 5

## 2014-10-12 MED ORDER — DIPHENHYDRAMINE HCL 12.5 MG/5ML PO ELIX: 12.5000 mg | ORAL_SOLUTION | ORAL | Status: DC | PRN

## 2014-10-12 MED ORDER — MIDAZOLAM HCL 2 MG/2ML IJ SOLN
INTRAMUSCULAR | Status: AC
  Filled 2014-10-12: qty 2

## 2014-10-12 MED ORDER — OXYCODONE-ACETAMINOPHEN 5-325 MG PO TABS: 1.0000 | ORAL_TABLET | ORAL | Status: DC | PRN

## 2014-10-12 MED ORDER — ONDANSETRON HCL 4 MG/2ML IJ SOLN: 4.0000 mg | Freq: Once | INTRAMUSCULAR | Status: DC | PRN

## 2014-10-12 MED ORDER — NEOSTIGMINE METHYLSULFATE 10 MG/10ML IV SOLN
INTRAVENOUS | Status: AC
  Filled 2014-10-12: qty 1

## 2014-10-12 MED ORDER — ARTIFICIAL TEARS OP OINT
TOPICAL_OINTMENT | OPHTHALMIC | Status: DC | PRN
  Administered 2014-10-12: 1 via OPHTHALMIC

## 2014-10-12 MED ORDER — PROPOFOL 10 MG/ML IV BOLUS
INTRAVENOUS | Status: DC | PRN
  Administered 2014-10-12: 200 mg via INTRAVENOUS

## 2014-10-12 MED ORDER — CEFAZOLIN SODIUM 1-5 GM-% IV SOLN
INTRAVENOUS | Status: AC
  Filled 2014-10-12: qty 50

## 2014-10-12 MED ORDER — PHENYLEPHRINE HCL 10 MG/ML IJ SOLN
INTRAMUSCULAR | Status: DC | PRN
  Administered 2014-10-12: 120 ug via INTRAVENOUS

## 2014-10-12 MED ORDER — SUCCINYLCHOLINE CHLORIDE 20 MG/ML IJ SOLN
INTRAMUSCULAR | Status: DC | PRN
  Administered 2014-10-12: 100 mg via INTRAVENOUS

## 2014-10-12 MED ORDER — GABAPENTIN 100 MG PO CAPS
100.0000 mg | ORAL_CAPSULE | Freq: Three times a day (TID) | ORAL | Status: DC
  Administered 2014-10-14 – 2014-10-15 (×4): 100 mg via ORAL
  Filled 2014-10-12 (×12): qty 1

## 2014-10-12 MED ORDER — KETOROLAC TROMETHAMINE 15 MG/ML IJ SOLN
INTRAMUSCULAR | Status: AC
  Administered 2014-10-12: 15 mg via INTRAVENOUS
  Filled 2014-10-12: qty 1

## 2014-10-12 MED ORDER — ONDANSETRON HCL 4 MG/2ML IJ SOLN
4.0000 mg | Freq: Four times a day (QID) | INTRAMUSCULAR | Status: DC | PRN
  Administered 2014-10-12: 4 mg via INTRAVENOUS
  Filled 2014-10-12: qty 2

## 2014-10-12 SURGICAL SUPPLY — 52 items
BLADE SAW SAG 73X25 THK (BLADE) ×1
BLADE SAW SGTL 73X25 THK (BLADE) ×1 IMPLANT
BLADE SURG 10 STRL SS (BLADE) IMPLANT
BLADE SURG 21 STRL SS (BLADE) ×2 IMPLANT
BRUSH FEMORAL CANAL (MISCELLANEOUS) IMPLANT
CAPT HIP TOTAL 2 ×1 IMPLANT
COVER BACK TABLE 24X17X13 BIG (DRAPES) IMPLANT
COVER SURGICAL LIGHT HANDLE (MISCELLANEOUS) ×2 IMPLANT
DRAPE IMP U-DRAPE 54X76 (DRAPES) ×2 IMPLANT
DRAPE INCISE IOBAN 85X60 (DRAPES) ×2 IMPLANT
DRAPE ORTHO SPLIT 77X108 STRL (DRAPES) ×4
DRAPE SURG ORHT 6 SPLT 77X108 (DRAPES) ×2 IMPLANT
DRAPE U-SHAPE 47X51 STRL (DRAPES) ×2 IMPLANT
DRSG MEPILEX BORDER 4X12 (GAUZE/BANDAGES/DRESSINGS) ×1 IMPLANT
DRSG MEPILEX BORDER 4X8 (GAUZE/BANDAGES/DRESSINGS) IMPLANT
DURAPREP 26ML APPLICATOR (WOUND CARE) ×2 IMPLANT
ELECT BLADE 4.0 EZ CLEAN MEGAD (MISCELLANEOUS) ×2
ELECT BLADE 6.5 EXT (BLADE) IMPLANT
ELECT CAUTERY BLADE 6.4 (BLADE) ×2 IMPLANT
ELECT REM PT RETURN 9FT ADLT (ELECTROSURGICAL) ×2
ELECTRODE BLDE 4.0 EZ CLN MEGD (MISCELLANEOUS) IMPLANT
ELECTRODE REM PT RTRN 9FT ADLT (ELECTROSURGICAL) ×1 IMPLANT
FACESHIELD STD STERILE (MASK) ×1 IMPLANT
GLOVE BIOGEL PI IND STRL 9 (GLOVE) ×1 IMPLANT
GLOVE BIOGEL PI INDICATOR 9 (GLOVE) ×1
GLOVE SURG ORTHO 9.0 STRL STRW (GLOVE) ×2 IMPLANT
GOWN STRL REUS W/ TWL XL LVL3 (GOWN DISPOSABLE) ×2 IMPLANT
GOWN STRL REUS W/TWL XL LVL3 (GOWN DISPOSABLE) ×2
HANDPIECE INTERPULSE COAX TIP (DISPOSABLE)
KIT BASIN OR (CUSTOM PROCEDURE TRAY) ×2 IMPLANT
KIT ROOM TURNOVER OR (KITS) ×2 IMPLANT
MANIFOLD NEPTUNE II (INSTRUMENTS) ×2 IMPLANT
NS IRRIG 1000ML POUR BTL (IV SOLUTION) ×2 IMPLANT
PACK TOTAL JOINT (CUSTOM PROCEDURE TRAY) ×2 IMPLANT
PACK UNIVERSAL I (CUSTOM PROCEDURE TRAY) ×2 IMPLANT
PAD ARMBOARD 7.5X6 YLW CONV (MISCELLANEOUS) ×4 IMPLANT
PRESSURIZER FEMORAL UNIV (MISCELLANEOUS) IMPLANT
SET HNDPC FAN SPRY TIP SCT (DISPOSABLE) IMPLANT
STAPLER VISISTAT 35W (STAPLE) ×2 IMPLANT
SUT ETHIBOND NAB CT1 #1 30IN (SUTURE) ×2 IMPLANT
SUT VIC AB 0 CT1 27 (SUTURE) ×4
SUT VIC AB 0 CT1 27XBRD ANBCTR (SUTURE) ×2 IMPLANT
SUT VIC AB 1 CTX 36 (SUTURE) ×2
SUT VIC AB 1 CTX36XBRD ANBCTR (SUTURE) ×1 IMPLANT
SUT VIC AB 2-0 CTB1 (SUTURE) ×3 IMPLANT
TOWEL OR 17X24 6PK STRL BLUE (TOWEL DISPOSABLE) ×2 IMPLANT
TOWEL OR 17X26 10 PK STRL BLUE (TOWEL DISPOSABLE) ×2 IMPLANT
TOWER CARTRIDGE SMART MIX (DISPOSABLE) IMPLANT
TRAY FOLEY CATH 16FRSI W/METER (SET/KITS/TRAYS/PACK) IMPLANT
TUBE CONNECTING 12X1/4 (SUCTIONS) ×1 IMPLANT
WATER STERILE IRR 1000ML POUR (IV SOLUTION) ×3 IMPLANT
YANKAUER SUCT BULB TIP NO VENT (SUCTIONS) ×1 IMPLANT

## 2014-10-12 NOTE — Evaluation (Addendum)
Physical Therapy Evaluation Patient Details Name: [name] MRN: [id number] DOB: 09/22/1975 Today's Date: 10/12/2014   History of Present Illness  Pt admitted 1/27 for elective L post THA due to traumatic arthritis.  Clinical Impression  Pt agreeable to PT after max encouragement. Upon sitting pt reported difficulty breathing, RN came to bedside to address. Vitals noted in "general comment" section. Concerned for pt going home alone essential as she will only have her 2612 and 847 yo children. Pt must be functioning at safe mod I level for safe d/c home. Acute PT to progress mobility.     Follow Up Recommendations Home health PT;Supervision/Assistance - 24 hour    Equipment Recommendations  Rolling walker with 5" wheels;3in1 (PT)    Recommendations for Other Services       Precautions / Restrictions Precautions Precautions: Posterior Hip Precaution Booklet Issued: Yes (comment) Precaution Comments: pt with verbal understanding Restrictions Weight Bearing Restrictions: Yes LLE Weight Bearing: Weight bearing as tolerated      Mobility  Bed Mobility Overal bed mobility: Needs Assistance Bed Mobility: Supine to Sit;Sit to Supine     Supine to sit: Min assist Sit to supine: Min assist   General bed mobility comments: assist for LEs, used bed rails and HOB elevated  Transfers                 General transfer comment: unable due to pt with "difficulty breathing"  Ambulation/Gait                Stairs            Wheelchair Mobility    Modified [name] (Stroke Patients Only)       Balance                                             Pertinent Vitals/Pain Pain Assessment: 0-10 Pain Score: 3  Pain Location: L hip3/10 L LE pain     Home Living Family/patient expects to be discharged to:: Private residence Living Arrangements: Children (12 and 7) Available Help at Discharge:  (will have no help, just 6612 and 257 yo children  when not in sch) Type of Home: Apartment Home Access: Stairs to enter Entrance Stairs-Rails: Right Entrance Stairs-Number of Steps: 2 sets of 6 Home Layout: One level Home Equipment: None      Prior Function Level of Independence: Independent               Hand Dominance   Dominant Hand: Right    Extremity/Trunk Assessment   Upper Extremity Assessment: Overall WFL for tasks assessed           Lower Extremity Assessment: LLE deficits/detail   LLE Deficits / Details: able to initiate hip flexion and quad set  Cervical / Trunk Assessment: Normal  Communication   Communication: No difficulties  Cognition Arousal/Alertness: Awake/alert Behavior During Therapy: Anxious Overall Cognitive Status: Within Functional Limits for tasks assessed                      General Comments General comments (skin integrity, edema, etc.): upon sitting EOB pt became SOB with report "I"m having difficulty breathing." SpO2 at 98% and BP 88/55. pt reported being dizzy and vision starting to go blurry in addition to nausea however then requested water. pt laid back down and RN came to address pt. pt  pulled self up in the bed    Exercises        Assessment/Plan    PT Assessment Patient needs continued PT services  PT Diagnosis Difficulty walking;Acute pain   PT Problem List Decreased strength;Decreased range of motion;Decreased activity tolerance;Decreased balance  PT Treatment Interventions DME instruction;Gait training;Stair training;Functional mobility training;Therapeutic activities;Therapeutic exercise   PT Goals (Current goals can be found in the Care Plan section) Acute Rehab PT Goals Patient Stated Goal: home PT Goal Formulation: With patient Time For Goal Achievement: 10/19/14 Potential to Achieve Goals: Good    Frequency 7X/week   Barriers to discharge Decreased caregiver support      Co-evaluation               End of Session   Activity Tolerance:  Treatment limited secondary to medical complications (Comment) Patient left: in bed;with call bell/phone within reach Nurse Communication: Mobility status         Time: 1610-9604 PT Time Calculation (min) (ACUTE ONLY): 24 min   Charges:   PT Evaluation $Initial PT Evaluation Tier I: 1 Procedure PT Treatments $Therapeutic Activity: 8-22 mins   PT G CodesMarcene Brawn 10/12/2014, 4:29 PM   Lewis Shock, PT, DPT Pager #: [phone] Office #: [phone]

## 2014-10-12 NOTE — H&P (Signed)
TOTAL HIP ADMISSION H&P  Patient is admitted for left total hip arthroplasty.  Subjective:  Chief Complaint: left hip pain  HPI: [name], 39 y.o. female, has a history of pain and functional disability in the left hip(s) due to trauma and patient has failed non-surgical conservative treatments for greater than 12 weeks to include NSAID's and/or analgesics, use of assistive devices and activity modification.  Onset of symptoms was gradual starting 8 years ago with gradually worsening course since that time.The patient noted prior procedures of the hip to include Open reduction internal fixation acetabular fracture on the left hip(s).  Patient currently rates pain in the left hip at 8 out of 10 with activity. Patient has night pain, worsening of pain with activity and weight bearing, trendelenberg gait, pain that interfers with activities of daily living, pain with passive range of motion and crepitus. Patient has evidence of subchondral cysts, subchondral sclerosis and joint space narrowing by imaging studies. This condition presents safety issues increasing the risk of falls. This patient has had avascular necrosis of the hip, acetabular fracture, hip dysplasia.  There is no current active infection.  Patient Active Problem List   Diagnosis Date Noted  . S/P repeat low transverse C-section 09/10/2013  . Cellulitis and abscess 12/22/2012  . Diabetes 11/25/2012   Past Medical History  Diagnosis Date  . Urinary tract infection     hx of  . Diabetic neuropathy   . Arthritis   . Herpes virus disease   . Chronic back pain   . Hip fracture     left side  . Cataracts, bilateral   . Abscess 11/2012    RIGHT FINGER  . Shortness of breath dyspnea     with exertion  . Diabetes mellitus     type 1  . History of blood transfusion 2013    left  hip- pinning    Past Surgical History  Procedure Laterality Date  . Fracture surgery      surgery left hip  . Cesarean section      x 3  .  Cataract extraction      left  . Cataract extraction w/phaco  06/12/2012    Procedure: CATARACT EXTRACTION PHACO AND INTRAOCULAR LENS PLACEMENT (IOC);  Surgeon: Shade FloodGreer Geiger, MD;  [facility];  Service: Ophthalmology;  Laterality: Right;  . Cataract extraction w/phaco  06/15/2012    Procedure: CATARACT EXTRACTION PHACO AND INTRAOCULAR LENS PLACEMENT (IOC);  Surgeon: Shade FloodGreer Geiger, MD;  [facility];  Service: Ophthalmology;  Laterality: Left;  . I&d extremity Right 11/23/2012    Procedure: IRRIGATION AND DEBRIDEMENT EXTREMITY;  Surgeon: Jodi Marbleavid A Thompson, MD;  [facility];  Service: Orthopedics;  Laterality: Right;  . I&d extremity Right 11/30/2012    Procedure: IRRIGATION AND DEBRIDEMENT Right hand Abscess;  Surgeon: Jodi Marbleavid A Thompson, MD;  [facility];  Service: Orthopedics;  Laterality: Right;  . Cesarean section with bilateral tubal ligation Bilateral 09/10/2013    Procedure: REPEAT CESAREAN SECTION WITH BILATERAL TUBAL LIGATION;  Surgeon: Purcell NailsAngela Y Roberts, MD;  Location: [facility] ORS;  Service: Obstetrics;  Laterality: Bilateral;  . Eye surgery Bilateral   . Tubal ligation      No prescriptions prior to admission   Allergies  Allergen Reactions  . Phenergan [Promethazine Hcl]     Lost vision  . Tramadol Other (See Comments)    "put her heart into shock"    History  Substance Use Topics  . Smoking status: Never Smoker   .  Smokeless tobacco: Never Used  . Alcohol Use: No    History reviewed. No pertinent family history.   ROS  Objective:  Physical Exam  Vital signs in last 24 hours:    Labs:   Estimated body mass index is 42.42 kg/(m^2) as calculated from the following:   Height as of 09/10/13:  (1.575 m).   Weight as of 09/08/13: 105.235 kg (232 lb).   Imaging Review Plain radiographs demonstrate moderate degenerative joint disease of the left hip(s). The bone quality appears to be adequate for age and reported activity level.  Assessment/Plan:  End  stage arthritis, left hip(s)  The patient history, physical examination, clinical judgement of the provider and imaging studies are consistent with end stage degenerative joint disease of the left hip(s) and total hip arthroplasty is deemed medically necessary. The treatment options including medical management, injection therapy, arthroscopy and arthroplasty were discussed at length. The risks and benefits of total hip arthroplasty were presented and reviewed. The risks due to aseptic loosening, infection, stiffness, dislocation/subluxation,  thromboembolic complications and other imponderables were discussed.  The patient acknowledged the explanation, agreed to proceed with the plan and consent was signed. Patient is being admitted for inpatient treatment for surgery, pain control, PT, OT, prophylactic antibiotics, VTE prophylaxis, progressive ambulation and ADL's and discharge planning.The patient is planning to be discharged home with home health services

## 2014-10-12 NOTE — Transfer of Care (Signed)
Immediate Anesthesia Transfer of Care Note  Patient: [name]  Procedure(s) Performed: Procedure(s): TOTAL HIP ARTHROPLASTY (Left)  Patient Location: PACU  Anesthesia Type:General  Level of Consciousness: awake, alert , oriented and sedated  Airway & Oxygen Therapy: Patient Spontanous Breathing and Patient connected to face mask oxygen  Post-op Assessment: Report given to PACU RN, Post -op Vital signs reviewed and stable and Patient moving all extremities  Post vital signs: Reviewed and stable  Complications: No apparent anesthesia complications

## 2014-10-12 NOTE — Anesthesia Preprocedure Evaluation (Signed)
Anesthesia Evaluation    Airway        Dental   Pulmonary          Cardiovascular     Neuro/Psych    GI/Hepatic   Endo/Other  diabetes, Type 2  Renal/GU      Musculoskeletal  (+) Arthritis -,   Abdominal   Peds  Hematology   Anesthesia Other Findings   Reproductive/Obstetrics                             Anesthesia Physical Anesthesia Plan  ASA: II  Anesthesia Plan: General   Post-op Pain Management:    Induction: Intravenous  Airway Management Planned: Oral ETT  Additional Equipment:   Intra-op Plan:   Post-operative Plan: Extubation in OR  Informed Consent: I have reviewed the patients History and Physical, chart, labs and discussed the procedure including the risks, benefits and alternatives for the proposed anesthesia with the patient or authorized representative who has indicated his/her understanding and acceptance.     Plan Discussed with: CRNA, Anesthesiologist and Surgeon  Anesthesia Plan Comments:         Anesthesia Quick Evaluation

## 2014-10-12 NOTE — Progress Notes (Signed)
Patient ID: Stefano GaulRasheda Duncan, female   DOB: 06/02/1976, 39 y.o.   MRN: [id number] Status post total hip arthroplasty on the left with takedown heterotopic ossification.  Patient seems to have some orthostatic hypotension at this time. Anticipate this should resolve with time. Plan for physical therapy progressive ambulation weightbearing as tolerated. Posterior total hip precautions. Anticipate patient can be discharged to home on Friday or Saturday when she is safe with therapy. Patient moves her ankles and toes well. Radiographs shows stable alignment of the total hip arthroplasty.

## 2014-10-12 NOTE — Anesthesia Procedure Notes (Signed)
Procedure Name: Intubation Date/Time: 10/12/2014 8:38 AM Performed by: Fransisca KaufmannMEYER, Jaksen Fiorella E Pre-anesthesia Checklist: Patient identified, Emergency Drugs available, Suction available, Patient being monitored and Timeout performed Patient Re-evaluated:Patient Re-evaluated prior to inductionOxygen Delivery Method: Circle system utilized Preoxygenation: Pre-oxygenation with 100% oxygen Intubation Type: IV induction Ventilation: Mask ventilation without difficulty Laryngoscope Size: Miller and 2 Grade View: Grade I Tube type: Oral Tube size: 7.5 mm Number of attempts: 1 Airway Equipment and Method: Stylet Placement Confirmation: ETT inserted through vocal cords under direct vision,  positive ETCO2,  CO2 detector and breath sounds checked- equal and bilateral Secured at: 22 cm Tube secured with: Tape Dental Injury: Teeth and Oropharynx as per pre-operative assessment

## 2014-10-12 NOTE — Anesthesia Postprocedure Evaluation (Signed)
  Anesthesia Post-op Note  Patient: Stefano GaulRasheda Emel  Procedure(s) Performed: Procedure(s): TOTAL HIP ARTHROPLASTY (Left)  Patient Location: PACU  Anesthesia Type:General  Level of Consciousness: awake, alert , oriented and patient cooperative  Airway and Oxygen Therapy: Patient Spontanous Breathing  Post-op Pain: mild  Post-op Assessment: Post-op Vital signs reviewed, Patient's Cardiovascular Status Stable, Respiratory Function Stable, Patent Airway, No signs of Nausea or vomiting and Pain level controlled  Post-op Vital Signs: stable  Last Vitals:  Filed Vitals:   10/12/14 1141  BP:   Pulse:   Temp: 36.6 C  Resp: 12    Complications: No apparent anesthesia complications

## 2014-10-12 NOTE — Op Note (Signed)
10/12/2014  10:25 AM  PATIENT:  [name]    PRE-OPERATIVE DIAGNOSIS:  Traumatic Arthritis Left Hip Deep retained hardware from open reduction internal fixation acetabular fracture Heterotopic ossification  POST-OPERATIVE DIAGNOSIS:  Same  PROCEDURE:  TOTAL HIP ARTHROPLASTY Removal of deep retained hardware Removal heterotopic ossification   SURGEON:  Nadara MustardUDA,Kambry Takacs V, MD  PHYSICIAN ASSISTANT:None ANESTHESIA:   General  PREOPERATIVE INDICATIONS:  [name] is a  39 y.o. female with a diagnosis of Traumatic Arthritis Left Hip who failed conservative measures and elected for surgical management.    The risks benefits and alternatives were discussed with the patient preoperatively including but not limited to the risks of infection, bleeding, nerve injury, cardiopulmonary complications, the need for revision surgery, among others, and the patient was willing to proceed.  OPERATIVE IMPLANTS: Medacta total hip arthroplasty Size 1 femoral stem. 36 mm metal head. -3.5 mm neck. 52 mm acetabulum. 10 polyethylene liner.  OPERATIVE FINDINGS: Significant amount of heterotopic ossification  OPERATIVE PROCEDURE: Patient was brought to the operating room and underwent a general anesthetic. After adequate levels of anesthesia were obtained patient was placed in the right lateral decubitus position with the left side up and the left lateral lower extremity was prepped using DuraPrep draped into a sterile field Ioban was used to cover all exposed skin. A timeout was called. Her previous posterior lateral incision was used this was carried down to the tensor fascia lata which was split. The hip was internally rotated and the piriformis short external rotators and capsule were incised off the femoral neck these were retracted. Patient had a prominent retained hardware and small frag screw was removed. There was significant amount of heterotopic ossification medially laterally and  inferiorly and osteotomes were used to remove the heterotopic ossification. The hip was dislocated. The neck cut was made 1 cm proximal to the calcar. Patient had sclerotic bone within the femoral neck with avascular necrosis of the femoral head. The acetabulum was sequentially reamed to 50 mm for a 52 mm acetabulum. This was inserted 45 of abduction 20 of anteversion. The 10 polyliner was inserted. Attention was then focused on the femur. There was significant amount of sclerotic bone within the femoral neck. This was removed with the cookie-cutter. The femoral canal was reamed and lateralized. This was sequentially broached to a size 1 stem this was stable. The trial head and necks were performed and the hip reduced well with the -3.5 mm neck. The hip was flexed 200 abducted and internally rotated 70 and the hip was stable. Patient's hip was also stable to external rotation and extension. The trial components were removed the final stem and metal ball were inserted the hip was reduced and placed through range of motion the hip was stable. The wound was irrigated with normal saline throughout the case. The tensor fascia lata was closed using #1 Vicryl subcutaneous is closed using 0 Vicryl skin was closed using staples. Patient was extubated taken to the PACU in stable condition.

## 2014-10-13 ENCOUNTER — Encounter (HOSPITAL_COMMUNITY): Payer: Self-pay | Admitting: Orthopedic Surgery

## 2014-10-13 LAB — BASIC METABOLIC PANEL
ANION GAP: 5 (ref 5–15)
BUN: 12 mg/dL (ref 6–23)
CHLORIDE: 100 mmol/L (ref 96–112)
CO2: 26 mmol/L (ref 19–32)
Calcium: 7.7 mg/dL — ABNORMAL LOW (ref 8.4–10.5)
Creatinine, Ser: 1.03 mg/dL (ref 0.50–1.10)
GFR calc Af Amer: 79 mL/min — ABNORMAL LOW (ref >90)
GFR calc non Af Amer: 68 mL/min — ABNORMAL LOW (ref >90)
GLUCOSE: 215 mg/dL — AB (ref 70–99)
Potassium: 4.2 mmol/L (ref 3.5–5.1)
Sodium: 131 mmol/L — ABNORMAL LOW (ref 135–145)

## 2014-10-13 LAB — GLUCOSE, CAPILLARY
GLUCOSE-CAPILLARY: 195 mg/dL — AB (ref 70–99)
GLUCOSE-CAPILLARY: 167 mg/dL — AB (ref 70–99)
Glucose-Capillary: 184 mg/dL — ABNORMAL HIGH (ref 70–99)
Glucose-Capillary: 154 mg/dL — ABNORMAL HIGH (ref 70–99)

## 2014-10-13 LAB — CBC
HEMATOCRIT: 20.7 % — AB (ref 36.0–46.0)
Hemoglobin: 7 g/dL — ABNORMAL LOW (ref 12.0–15.0)
MCH: 26.6 pg (ref 26.0–34.0)
MCHC: 33.8 g/dL (ref 30.0–36.0)
MCV: 78.7 fL (ref 78.0–100.0)
Platelets: 220 10*3/uL (ref 150–400)
RBC: 2.63 MIL/uL — AB (ref 3.87–5.11)
RDW: 13.7 % (ref 11.5–15.5)
WBC: 7.8 10*3/uL (ref 4.0–10.5)

## 2014-10-13 LAB — PREPARE RBC (CROSSMATCH)

## 2014-10-13 LAB — HEMOGLOBIN AND HEMATOCRIT, BLOOD
HCT: 27.2 % — ABNORMAL LOW (ref 36.0–46.0)
Hemoglobin: 9.2 g/dL — ABNORMAL LOW (ref 12.0–15.0)

## 2014-10-13 LAB — ABO/RH: ABO/RH(D): A POS

## 2014-10-13 MED ORDER — SODIUM CHLORIDE 0.9 % IV SOLN
Freq: Once | INTRAVENOUS | Status: AC
  Administered 2014-10-13: 11:00:00 via INTRAVENOUS

## 2014-10-13 MED ORDER — FUROSEMIDE 10 MG/ML IJ SOLN
20.0000 mg | Freq: Once | INTRAMUSCULAR | Status: AC
  Administered 2014-10-13: 20 mg via INTRAVENOUS
  Filled 2014-10-13: qty 2

## 2014-10-13 NOTE — Progress Notes (Signed)
Subjective: Hip pain controlled.  C/o feeling tired, lightheaded when sitting up.  Unable to work with PT yesterday.  Denies chest pain.     Objective: Vital signs in last 24 hours: Temp:  [97.8 F (36.6 C)-98.8 F (37.1 C)] 98.8 F (37.1 C) (01/28 0530) Pulse Rate:  [59-108] 108 (01/28 0530) Resp:  [10-17] 17 (01/28 0530) BP: (93-111)/(44-63) 110/57 mmHg (01/28 0530) SpO2:  [98 %-100 %] 100 % (01/28 0530)  Intake/Output from previous day: 01/27 0701 - 01/28 0700 In: 1720 [I.V.:1720] Out: 600 [Urine:200; Blood:400] Intake/Output this shift:     Recent Labs  10/12/14 0653 10/13/14 0700  HGB 11.2* 7.0*    Recent Labs  10/12/14 0653 10/13/14 0700  WBC 8.9 7.8  RBC 4.32 2.63*  HCT 34.2* 20.7*  PLT 280 220    Recent Labs  10/12/14 0653 10/13/14 0700  NA 135 131*  K 4.1 4.2  CL 103 100  CO2 26 26  BUN 10 12  CREATININE 0.83 1.03  GLUCOSE 152* 215*  CALCIUM 9.0 7.7*    Recent Labs  10/12/14 0653  INR 1.07    Exam:  Dressing intact.  Some thigh swelling but soft.  Calf nontender, NVI.    Assessment/Plan: Symptomatic blood loss anemia, Hgb 7.0.  Will transfuse 2 units PRBC's today.  Hold PT until transfusion complete and patient stable.     OWENS,JAMES M 10/13/2014, 8:22 AM

## 2014-10-13 NOTE — Progress Notes (Signed)
PT Cancellation Note  Patient Details Name: Erika GaulRasheda Duncan MRN: [id number] DOB: 01/14/1976   Cancelled Treatment:    Reason Eval/Treat Not Completed: Patient not medically ready. Hgb at 7.0 and symptomatic. pt planned for transfusion today. Will hold on therapy till hgb within therapeutic range.    Donnamarie PoagWest, Camrin Gearheart CaldwellN , [place]PT  [phone]  10/13/2014, 9:16 AM

## 2014-10-13 NOTE — Progress Notes (Signed)
Inpatient Diabetes Program Recommendations  AACE/ADA: New Consensus Statement on Inpatient Glycemic Control (2013)  Target Ranges:  Prepandial:   less than 140 mg/dL      Peak postprandial:   less than 180 mg/dL (1-2 hours)      Critically ill patients:  140 - 180 mg/dL   Results for [name], [name] (MRN [id number]) as of 10/13/2014 12:20  Ref. Range 10/12/2014 13:43 10/12/2014 16:52 10/12/2014 21:30 10/13/2014 06:38 10/13/2014 11:56  Glucose-Capillary Latest Range: 70-99 mg/dL 409231 (H) 811239 (H) 914204 (H) 184 (H) 195 (H)    Reason for assessment: elevated CBG  Diabetes history: Type 2 Outpatient Diabetes medications: Levemir 25units q day, Liraglutide 0.246ml Current orders for Inpatient glycemic control: Novolog 0-15 units with meals, Novolog 4mg  tid with meals, Levemir 25units q day, Liraglutide 0.146mls  Patient is refusing mealtime Novolog - based on fasting CBG, please consider increasing Levemir to 30 units q day  Susette RacerJulie Nori Winegar, RN, [place], [place], CDE Diabetes Coordinator Inpatient Diabetes Program  [phone] (Team Pager) [phone] Patrcia Dolly([place] Office) 10/13/2014 12:31 PM

## 2014-10-13 NOTE — Progress Notes (Signed)
Utilization review completed. Henri Guedes, RN, BSN. 

## 2014-10-13 NOTE — Progress Notes (Signed)
OT Cancellation Note  Patient Details Name: Erika GaulRasheda Duncan MRN: [id number] DOB: 03/20/1976   Cancelled Treatment:    Reason Eval/Treat Not Completed: Medical issues which prohibited therapy;Other (comment) (Per PA note pt receving transfusion hold therapy until stabl) Acute OT to reattempt evaluation.  Pilar GrammesMathews, Shaheem Pichon H 10/13/2014, 9:04 AM

## 2014-10-14 LAB — GLUCOSE, CAPILLARY
Glucose-Capillary: 208 mg/dL — ABNORMAL HIGH (ref 70–99)
Glucose-Capillary: 130 mg/dL — ABNORMAL HIGH (ref 70–99)
Glucose-Capillary: 174 mg/dL — ABNORMAL HIGH (ref 70–99)
Glucose-Capillary: 129 mg/dL — ABNORMAL HIGH (ref 70–99)

## 2014-10-14 LAB — CBC
HEMATOCRIT: 27.4 % — AB (ref 36.0–46.0)
HEMOGLOBIN: 9.2 g/dL — AB (ref 12.0–15.0)
MCH: 27.1 pg (ref 26.0–34.0)
MCHC: 33.6 g/dL (ref 30.0–36.0)
MCV: 80.8 fL (ref 78.0–100.0)
Platelets: 215 10*3/uL (ref 150–400)
RBC: 3.39 MIL/uL — ABNORMAL LOW (ref 3.87–5.11)
RDW: 14 % (ref 11.5–15.5)
WBC: 10.6 10*3/uL — AB (ref 4.0–10.5)

## 2014-10-14 LAB — BASIC METABOLIC PANEL
ANION GAP: 4 — AB (ref 5–15)
BUN: 7 mg/dL (ref 6–23)
CO2: 29 mmol/L (ref 19–32)
Calcium: 8.4 mg/dL (ref 8.4–10.5)
Chloride: 102 mmol/L (ref 96–112)
Creatinine, Ser: 0.83 mg/dL (ref 0.50–1.10)
GFR calc Af Amer: >90 mL/min (ref >90)
GFR calc non Af Amer: 88 mL/min — ABNORMAL LOW (ref >90)
Glucose, Bld: 200 mg/dL — ABNORMAL HIGH (ref 70–99)
Potassium: 3.6 mmol/L (ref 3.5–5.1)
Sodium: 135 mmol/L (ref 135–145)

## 2014-10-14 LAB — TYPE AND SCREEN
ABO/RH(D): A POS
Antibody Screen: NEGATIVE
Unit division: 0
Unit division: 0

## 2014-10-14 NOTE — Progress Notes (Addendum)
Physical Therapy Treatment Patient Details Name: Erika GaulRasheda Duncan MRN: [id number] DOB: 02/04/1976 Today's Date: 10/14/2014    History of Present Illness 39 y.o. 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  Pt with other significant PMHx of DM 2 and diabetic neuropathy, chronic back pain, L hip fracture (s/p surgery), R finger/hand abcess requiring multiple surgeries, and SOB with dyspnea.     PT Comments    Pt's second session today went well despite some increase in pain and stiffness in her left leg.  She continues to mobilize with supervision/mod I and was able to preform stairs simulating her getting upstairs inside of her home with supervision.  I have not seen her try to get back in the bed and she may be ready for cane trials. PT will continue to follow acutely.  Pt hopes to d/c home in AM.    Follow Up Recommendations  Home health PT;Supervision - Intermittent     Equipment Recommendations  Rolling walker with 5" wheels;3in1 (PT)    Recommendations for Other Services   NA     Precautions / Restrictions Precautions Precautions: Posterior Hip Precaution Booklet Issued: Yes (comment) Precaution Comments: Pt unable to report any of her hip precautions.  I verbally reviewed them with her again and gave her the handout for visual reference.  Restrictions LLE Weight Bearing: Weight bearing as tolerated    Mobility    Transfers Overall transfer level: Needs assistance Equipment used: Rolling walker (2 wheeled) Transfers: Sit to/from Stand Sit to Stand: Modified independent (Device/Increase time)         General transfer comment: Pt able to get up and down from recliner using bil hands for support during transitions.  She [facility].   Ambulation/Gait Ambulation/Gait assistance: Supervision Ambulation Distance (Feet): 220 Feet Assistive device: Rolling walker (2 wheeled) Gait Pattern/deviations:  Step-through pattern;Antalgic Gait velocity: decreased Gait velocity interpretation: Below normal speed for age/gender General Gait Details: Pt has moderately antalgic gait pattern this PM due to increased stiffness and pain, however, the further she walked the better it became.  Her right hand, however, got to the point that it was too painful ~50' from her room, so we left the RW and walked with hand held assist back to the room.  I am not sure if a cane would be less stressful as she would still need to carry the cane in her right hand, but worth a try next session.    Stairs Stairs: Yes Stairs assistance: Supervision Stair Management: One rail Right;Step to pattern;Forwards;With walker (with folded up walker) Number of Stairs: 5 General stair comments: Had pt fold up RW this time instead of therapist to make sure she had the balance needed to do this task. Pt able to ascend and descend stairs, only needed verbal cues for sore leg down x1 as she mixed it up on her second descending stair.  She was able to do it leading with the right, but you could tell it was more painful.          Balance Overall balance assessment: Needs assistance Sitting-balance support: Feet supported;No upper extremity supported Sitting balance-Leahy Scale: Good     Standing balance support: Bilateral upper extremity supported;No upper extremity supported;Single extremity supported Standing balance-Leahy Scale: Good                      Cognition Arousal/Alertness: Awake/alert Behavior During Therapy: [facility]  for tasks assessed/performed Overall Cognitive Status: Within Functional Limits for tasks assessed                      Exercises Total Joint Exercises Ankle Circles/Pumps: AROM;Both;10 reps;Supine Quad Sets: AROM;10 reps;Supine Short Arc Quad: AROM;10 reps;Supine Heel Slides: AAROM;Left;10 reps;Supine Hip ABduction/ADduction: AROM;Left;10 reps;Supine Long Arc Quad: AROM;Left;10  reps;Seated Knee Flexion: AROM;Left;10 reps;Standing Marching in Standing: AROM;Left;10 reps;Standing Standing Hip Extension: AROM;Left;10 reps;Standing        Pertinent Vitals/Pain Pain Assessment: 0-10 Pain Score: 6  Pain Location: left hip Pain Descriptors / Indicators: Aching;Burning Pain Intervention(s): Limited activity within patient's tolerance;Monitored during session;Repositioned           PT Goals (current goals can now be found in the care plan section) Acute Rehab PT Goals Patient Stated Goal: Pt wants to go home in AM Progress towards PT goals: Progressing toward goals    Frequency  7X/week    PT Plan Current plan remains appropriate       End of Session   Activity Tolerance: Patient limited by pain Patient left: in chair;with call bell/phone within reach     Time: 1525-1550 PT Time Calculation (min) (ACUTE ONLY): 25 min  Charges:  $Gait Training: 8-22 mins $Therapeutic Exercise: 8-22 mins                      Daymeon Fischman B. Kadeidra Coryell, PT, DPT [phone]   10/14/2014, 4:37 PM

## 2014-10-14 NOTE — Progress Notes (Signed)
Inpatient Diabetes Program Recommendations  AACE/ADA: New Consensus Statement on Inpatient Glycemic Control (2013)  Target Ranges:  Prepandial:   less than 140 mg/dL      Peak postprandial:   less than 180 mg/dL (1-2 hours)      Critically ill patients:  140 - 180 mg/dL   Reason for assessment:  Fasting CBG remain elevated   Diabetes history: Type 2 Outpatient Diabetes medications: Levemir 25units q day, Liraglutide 0.276ml Current orders for Inpatient glycemic control: Novolog 0-15 units with meals, Novolog 4mg  tid with meals, Levemir 25units q day, Liraglutide 0.486mls  Based on fasting CBG, please consider increasing Levemir to 30 units q day.  May want to get an A1C to determine home blood sugar control.   Susette RacerJulie Naoki Migliaccio, RN, BA, MHA, CDE Diabetes Coordinator Inpatient Diabetes Program  [phone] (Team Pager) [phone] Patrcia Dolly( Office) 10/14/2014 1:49 PM

## 2014-10-14 NOTE — Progress Notes (Signed)
Physical Therapy Treatment Patient Details Name: Erika GaulRasheda Waszak MRN: [id number] DOB: 12/12/1975 Today's Date: 10/14/2014    History of Present Illness 39 y.o. 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  Pt with other significant PMHx of DM 2 and diabetic neuropathy, chronic back pain, L hip fracture (s/p surgery), R finger/hand abcess requiring multiple surgeries, and SOB with dyspnea.     PT Comments    Pt is making significant progress with gait and mobility.  She is able to ambulate with supervision and preform stairs simulating getting upstairs to her bedroom with min guard assist for safety.  Pt will likely progress well enough to go home with intermittent supervision and this is her wish.  She will need RW and 3-in-1 for home use. PT will continue to follow acutely.   Follow Up Recommendations  Home health PT;Supervision - Intermittent     Equipment Recommendations  Rolling walker with 5" wheels;3in1 (PT)    Recommendations for Other Services   NA     Precautions / Restrictions Precautions Precautions: Posterior Hip Precaution Booklet Issued: Yes (comment) Precaution Comments: Pt unable to report any of her hip precautions.  I verbally reviewed them with her again and gave her the handout for visual reference.  Restrictions LLE Weight Bearing: Weight bearing as tolerated    Mobility  Bed Mobility Overal bed mobility: Needs Assistance Bed Mobility: Supine to Sit     Supine to sit: Supervision     General bed mobility comments: Supervision for safety, pt using her hands to progress left leg over side of the bed.   Transfers Overall transfer level: Needs assistance Equipment used: Rolling walker (2 wheeled) Transfers: Sit to/from Stand Sit to Stand: Modified independent (Device/Increase time)         General transfer comment: Pt able to use hands for transitions and showed safe technique.     Ambulation/Gait Ambulation/Gait assistance: Supervision Ambulation Distance (Feet): 220 Feet Assistive device: Rolling walker (2 wheeled) Gait Pattern/deviations: Step-through pattern;Antalgic Gait velocity: decreased Gait velocity interpretation: Below normal speed for age/gender General Gait Details: Pt with mildly antalgic gait pattern.  Right wrist/hand fatigued (h/o surgery here) before leg did and she had light hands on RW.     Stairs Stairs: Yes Stairs assistance: Min guard Stair Management: One rail Right;Forwards;With walker (folded up walker) Number of Stairs: 5 General stair comments: verbal cues for safe LE sequencing, how to fold walker, min guard assist for safety during transitions up/down.          Balance Overall balance assessment: Needs assistance Sitting-balance support: Feet supported;No upper extremity supported Sitting balance-Leahy Scale: Good     Standing balance support: Bilateral upper extremity supported;Single extremity supported;No upper extremity supported Standing balance-Leahy Scale: Good                      Cognition Arousal/Alertness: Awake/alert Behavior During Therapy: WFL for tasks assessed/performed Overall Cognitive Status: Within Functional Limits for tasks assessed                      Exercises Total Joint Exercises Ankle Circles/Pumps: AROM;Both;10 reps;Supine Quad Sets: AROM;10 reps;Supine Short Arc Quad: AROM;10 reps;Supine Heel Slides: AAROM;Left;10 reps;Supine Hip ABduction/ADduction: AROM;Left;10 reps;Supine;Standing Long Arc Quad: AROM;Left;10 reps;Seated Knee Flexion: AROM;Left;10 reps;Standing Marching in Standing: AROM;Left;10 reps;Standing Standing Hip Extension: AROM;Left;10 reps;Standing        Pertinent Vitals/Pain Pain Assessment: 0-10 Pain Score: 5  Pain Location: left hip  Pain Descriptors / Indicators: Aching;Burning Pain Intervention(s): Limited activity within patient's  tolerance;Monitored during session;Repositioned           PT Goals (current goals can now be found in the care plan section) Acute Rehab PT Goals Patient Stated Goal: Pt wants to go home in AM Progress towards PT goals: Progressing toward goals    Frequency  7X/week    PT Plan Current plan remains appropriate       End of Session   Activity Tolerance: Patient tolerated treatment well Patient left: in chair;with call bell/phone within reach     Time: 1213-1252 PT Time Calculation (min) (ACUTE ONLY): 39 min  Charges:  $Gait Training: 8-22 mins $Therapeutic Exercise: 8-22 mins $Therapeutic Activity: 8-22 mins                      Lakara Weiland B. Deneka Greenwalt, PT, DPT [phone]   10/14/2014, 4:27 PM

## 2014-10-14 NOTE — Progress Notes (Signed)
Subjective: Pain controlled.  Feels better after transfusion.     Objective: Vital signs in last 24 hours: Temp:  [98.8 F (37.1 C)-102 F (38.9 C)] 98.8 F (37.1 C) (01/29 0626) Pulse Rate:  [100-120] 110 (01/29 0626) Resp:  [16-18] 17 (01/29 0626) BP: (88-115)/(46-71) 110/62 mmHg (01/29 0626) SpO2:  [100 %] 100 % (01/29 0626)  Intake/Output from previous day: 01/28 0701 - 01/29 0700 In: 1390 [P.O.:720; Blood:670] Out: -  Intake/Output this shift:     Recent Labs  10/12/14 0653 10/13/14 0700 10/13/14 2106 10/14/14 0738  HGB 11.2* 7.0* 9.2* 9.2*    Recent Labs  10/13/14 0700 10/13/14 2106 10/14/14 0738  WBC 7.8  --  10.6*  RBC 2.63*  --  3.39*  HCT 20.7* 27.2* 27.4*  PLT 220  --  215    Recent Labs  10/13/14 0700 10/14/14 0738  NA 131* 135  K 4.2 3.6  CL 100 102  CO2 26 29  BUN 12 7  CREATININE 1.03 0.83  GLUCOSE 215* 200*  CALCIUM 7.7* 8.4    Recent Labs  10/12/14 0653  INR 1.07    Exam:  Wound looks good.  No signs of infection.  Calf nontender. NVI.   Assessment/Plan: Continue PT. Anticipate d/c home tomorrow if makes good progress.     Guinn Delarosa M 10/14/2014, 10:02 AM

## 2014-10-14 NOTE — Progress Notes (Signed)
10[facility]14/14 [facility]. [facility] Corrie DandyMary [facility], due to patient's comorbidities and limited home support [facility] as well. Patient's insurance does not cover HHOT. Contacted Frank with [employer] and requested rolling walker, 3N1 and tub bench be delivered to patient's room. CM will continue to follow until d/c.

## 2014-10-14 NOTE — Progress Notes (Signed)
Patient given two units of blood on previous shift. Post-transfusion temp at 1835 was 100.9. Temperature at 2007 was 102 with heart rate of 120. Encouraged patient to use incentive spirometer. Temperature ws retaken at 2030, 101.2. Tylenol given at 2040. Notified on MD Magnus Ivan([place]) of temp. MD encouraged IS. Will continue to monitor temperature issues and encourage patient to use IS.

## 2014-10-14 NOTE — Evaluation (Signed)
Occupational Therapy Evaluation Patient Details Name: [name] MRN: [id number] DOB: 1975-12-02 Today's Date: 10/14/2014    History of Present Illness Pt admitted 1/27 for elective L post THA due to traumatic arthritis.   Clinical Impression   Patient independent PTA. Patient currently functioning at an overall supervision level. Patient will benefit from acute OT to increase overall independence in the areas of ADLs, functional mobility, safety, AE education in order to safely discharge home. For safety, patient is supervision for ADL and functional mobility aspects, recommending patient have 24/7 supervision/assistance once discharged home. According to patient, no one is available at discharge to assist. Feel patient will need to reach a more mod I functional level before being able to safely discharge without assistance.     Follow Up Recommendations  Home health OT;Supervision/Assistance - 24 hour    Equipment Recommendations  3 in 1 bedside comode;Tub/shower bench    Recommendations for Other Services Other (comment) (aide to assist at home)     Precautions / Restrictions Precautions Precautions: Posterior Hip Precaution Comments: Reviewed posterior hip precautions with patient, handout in room Restrictions Weight Bearing Restrictions: Yes LLE Weight Bearing: Weight bearing as tolerated      Mobility Bed Mobility Overal bed mobility: Needs Assistance Bed Mobility: Rolling;Supine to Sit Rolling: Supervision   Supine to sit: Supervision Sit to supine: Supervision   General bed mobility comments: bed rails used and HOB elevated  Transfers Overall transfer level: Needs assistance Equipment used: Rolling walker (2 wheeled) Transfers: Sit to/from [employer] Sit to Stand: Supervision Stand pivot transfers: Supervision            Balance Overall balance assessment: Needs assistance Sitting-balance support: Feet supported;No upper extremity  supported Sitting balance-Leahy Scale: Good     Standing balance support: Bilateral upper extremity supported Standing balance-Leahy Scale: Good     ADL Overall ADL's : Needs assistance/impaired Eating/Feeding: Independent   Grooming: Supervision/safety;Standing   Upper Body Bathing: Set up;Sitting   Lower Body Bathing: Sit to/from stand;With adaptive equipment;Supervison/ safety   Upper Body Dressing : Set up;Sitting   Lower Body Dressing: With adaptive equipment;Supervision/safety   Toilet Transfer: Supervision/safety;BSC;RW   Toileting- Clothing Manipulation and Hygiene: Sit to/from stand       Functional mobility during ADLs: Supervision/safety;Rolling walker;Cueing for safety General ADL Comments: Educated patient on use of AE (reacher, sock aid, LH sponge). Patient requires verbal cues for safety with RW during functional ambulation/mobility. Patient states she has no assistance at home and that her 39 y/o daughter assists with meals before and after school. Patient states she plans to stay upstairs where her bed/bath are located during the day.                Pertinent Vitals/Pain Pain Assessment: 0-10 Pain Score: 6  Pain Location: L hip Pain Descriptors / Indicators: Aching Pain Intervention(s): Monitored during session;Repositioned     Hand Dominance Right   Extremity/Trunk Assessment Upper Extremity Assessment Upper Extremity Assessment: Overall WFL for tasks assessed   Lower Extremity Assessment Lower Extremity Assessment: Defer to PT evaluation   Cervical / Trunk Assessment Cervical / Trunk Assessment: Normal   Communication Communication Communication: No difficulties   Cognition Arousal/Alertness: Awake/alert Behavior During Therapy: WFL for tasks assessed/performed Overall Cognitive Status: Within Functional Limits for tasks assessed             Home Living Family/patient expects to be discharged to:: Private residence Living  Arrangements: Children (12 and 7) Available Help at Discharge:  (  no assistance at home until 39 y/o daughter gets home from school) Type of Home: Apartment Home Access: Level entry     Home Layout: Two level Alternate Level Stairs-Number of Steps: 2 sets of 6 Alternate Level Stairs-Rails: Right;Left Bathroom Shower/Tub: Tub/shower unit;Curtain   Bathroom Toilet: Standard     Home Equipment: None   Prior Functioning/Environment Level of Independence: Independent     OT Diagnosis: Generalized weakness;Acute pain   OT Problem List: Decreased strength;Decreased range of motion;Decreased activity tolerance;Decreased safety awareness;Decreased knowledge of use of DME or AE;Decreased knowledge of precautions;Pain   OT Treatment/Interventions: Self-care/ADL training;Therapeutic exercise;Energy conservation;DME and/or AE instruction;Therapeutic activities;Patient/family education    OT Goals(Current goals can be found in the care plan section) Acute Rehab OT Goals Patient Stated Goal: "I'm going home tomorrow, right?" OT Goal Formulation: With patient Time For Goal Achievement: 10/28/14 Potential to Achieve Goals: Good ADL Goals Pt Will Perform Lower Body Bathing: with modified independence;with adaptive equipment;sit to/from stand Pt Will Perform Lower Body Dressing: with modified independence;with adaptive equipment;sit to/from stand Pt Will Transfer to Toilet: with modified independence;ambulating;bedside commode Pt Will Perform Tub/Shower Transfer: with modified independence;tub bench;ambulating;rolling walker Additional ADL Goal #1: Patient will independently verbalize and adhere to 3/3 posterior hip precautions in order to safely perform functional mobility/transfers/ADLs  OT Frequency: Min 2X/week   Barriers to D/C: Decreased caregiver support;Inaccessible home environment          End of Session Equipment Utilized During Treatment: Rolling walker  Activity Tolerance:  Patient tolerated treatment well Patient left: in chair;with call bell/phone within reach   Time: 0845-0920 OT Time Calculation (min): 35 min Charges:  OT General Charges $OT Visit: 1 Procedure OT Evaluation $Initial OT Evaluation Tier I: 1 Procedure OT Treatments $Self Care/Home Management : 8-22 mins  [name] , MS, OTR/L, CLT Pager: [phone]  10/14/2014, 9:34 AM

## 2014-10-15 LAB — GLUCOSE, CAPILLARY
GLUCOSE-CAPILLARY: 197 mg/dL — AB (ref 70–99)
GLUCOSE-CAPILLARY: 167 mg/dL — AB (ref 70–99)

## 2014-10-15 LAB — BASIC METABOLIC PANEL
Anion gap: 5 (ref 5–15)
BUN: 8 mg/dL (ref 6–23)
CO2: 29 mmol/L (ref 19–32)
CREATININE: 0.83 mg/dL (ref 0.50–1.10)
Calcium: 8.1 mg/dL — ABNORMAL LOW (ref 8.4–10.5)
Chloride: 98 mmol/L (ref 96–112)
GFR calc Af Amer: >90 mL/min (ref >90)
GFR, EST NON AFRICAN AMERICAN: 88 mL/min — AB (ref >90)
Glucose, Bld: 220 mg/dL — ABNORMAL HIGH (ref 70–99)
Potassium: 4.2 mmol/L (ref 3.5–5.1)
SODIUM: 132 mmol/L — AB (ref 135–145)

## 2014-10-15 LAB — CBC
HEMATOCRIT: 24.3 % — AB (ref 36.0–46.0)
Hemoglobin: 8.4 g/dL — ABNORMAL LOW (ref 12.0–15.0)
MCH: 28 pg (ref 26.0–34.0)
MCHC: 34.6 g/dL (ref 30.0–36.0)
MCV: 81 fL (ref 78.0–100.0)
PLATELETS: 247 10*3/uL (ref 150–400)
RBC: 3 MIL/uL — AB (ref 3.87–5.11)
RDW: 14.4 % (ref 11.5–15.5)
WBC: 9.4 10*3/uL (ref 4.0–10.5)

## 2014-10-15 NOTE — Progress Notes (Signed)
Physical Therapy Treatment Patient Details Name: [name] MRN: [id number] DOB: 08/12/1976 Today's Date: 10/15/2014    History of Present Illness 39 y.o. 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  Pt with other significant PMHx of DM 2 and diabetic neuropathy, chronic back pain, L hip fracture (s/p surgery), R finger/hand abcess requiring multiple surgeries, and SOB with dyspnea.     PT Comments    Pt at mod i to supervision level. Reviewed stair ambulation technique and HEP. Pt appropriate for D/c home at this time from mobility standpoint.   Follow Up Recommendations  Home health PT;Supervision - Intermittent     Equipment Recommendations  Rolling walker with 5" wheels;3in1 (PT)    Recommendations for Other Services       Precautions / Restrictions Precautions Precautions: Posterior Hip Precaution Comments: pt able to independently recall 2/3 precautions; reviewed throughout session with pt Restrictions Weight Bearing Restrictions: Yes LLE Weight Bearing: Weight bearing as tolerated    Mobility  Bed Mobility               General bed mobility comments: given instructions verbally and visually for technique   Transfers Overall transfer level: Needs assistance Equipment used: Rolling walker (2 wheeled) Transfers: Sit to/from Stand Sit to Stand: Modified independent (Device/Increase time)         General transfer comment: MOD I; pt up in room upon arrival  Ambulation/Gait Ambulation/Gait assistance: Modified independent (Device/Increase time) Ambulation Distance (Feet): 400 Feet Assistive device: Rolling walker (2 wheeled) Gait Pattern/deviations: Step-through pattern;Decreased stride length;Antalgic Gait velocity: decreased Gait velocity interpretation: Below normal speed for age/gender General Gait Details: slightly antalgic gt at times due to soreness/stiffness; was able to mobilize in room, short distance  without RW but with short shuffled gt; no LOB noted    Stairs Stairs: Yes Stairs assistance: Supervision Stair Management: One rail Right;With walker;Forwards Number of Stairs: 5 General stair comments: supervision for safety; pt able to recall appropriate technique independently  Wheelchair Mobility    Modified [name] (Stroke Patients Only)       Balance Overall balance assessment: Needs assistance Sitting-balance support: Feet supported;No upper extremity supported Sitting balance-Leahy Scale: Normal       Standing balance-Leahy Scale: Good                      Cognition Arousal/Alertness: Awake/alert Behavior During Therapy: WFL for tasks assessed/performed Overall Cognitive Status: Within Functional Limits for tasks assessed       Memory: Decreased recall of precautions              Exercises Total Joint Exercises Ankle Circles/Pumps: Both;10 reps Quad Sets: Both;10 reps Gluteal Sets: Other (comment);10 reps Hip ABduction/ADduction: Left;10 reps;Supine;AAROM Knee Flexion: AROM;Left;10 reps;Standing Marching in Standing: AROM;Left;10 reps;Standing Standing Hip Extension: AROM;Left;10 reps;Standing Other Exercises Other Exercises: reviewed using sheet/towel for AAROM for exercises to incr ROM as tolerated    General Comments General comments (skin integrity, edema, etc.): reviewed car transfer technique; answered pt questions regarding driving and home health       Pertinent Vitals/Pain Pain Assessment: 0-10 Pain Score: 3  Pain Location: Lt hip Pain Descriptors / Indicators: Burning;Sore Pain Intervention(s): Monitored during session;Premedicated before session;Repositioned    Home Living                      Prior Function            PT Goals (current goals can  now be found in the care plan section) Acute Rehab PT Goals Patient Stated Goal: home today PT Goal Formulation: With patient Time For Goal Achievement:  10/19/14 Potential to Achieve Goals: Good Progress towards PT goals: Progressing toward goals    Frequency  7X/week    PT Plan Current plan remains appropriate    Co-evaluation             End of Session   Activity Tolerance: Patient tolerated treatment well Patient left: in chair;with call bell/phone within reach     Time: 0757-0814 PT Time Calculation (min) (ACUTE ONLY): 17 min  Charges:  $Gait Training: 8-22 mins                    G CodesDonnamarie Poag:      Hazelee Harbold FowlerN, [place] CarolinaPT [phone] 10/15/2014, 8:21 AM

## 2014-10-15 NOTE — Progress Notes (Signed)
Subjective: 3 Days Post-Op Procedure(s) (LRB): TOTAL HIP ARTHROPLASTY (Left) Patient reports pain as mild.    Objective: Vital signs in last 24 hours: Temp:  [98.9 F (37.2 C)-102.7 F (39.3 C)] 98.9 F (37.2 C) (01/30 0534) Pulse Rate:  [90-117] 90 (01/30 0534) Resp:  [16-18] 18 (01/30 0400) BP: (101-112)/(48-71) 110/53 mmHg (01/30 0534) SpO2:  [99 %-100 %] 100 % (01/30 0534)  Intake/Output from previous day: 01/29 0701 - 01/30 0700 In: 720 [P.O.:720] Out: -  Intake/Output this shift: Total I/O In: 240 [P.O.:240] Out: -    Recent Labs  10/13/14 0700 10/13/14 2106 10/14/14 0738 10/15/14 0525  HGB 7.0* 9.2* 9.2* 8.4*    Recent Labs  10/14/14 0738 10/15/14 0525  WBC 10.6* 9.4  RBC 3.39* 3.00*  HCT 27.4* 24.3*  PLT 215 247    Recent Labs  10/14/14 0738 10/15/14 0525  NA 135 132*  K 3.6 4.2  CL 102 98  CO2 29 29  BUN 7 8  CREATININE 0.83 0.83  GLUCOSE 200* 220*  CALCIUM 8.4 8.1*   No results for input(s): LABPT, INR in the last 72 hours.  Neurologically intact  Assessment/Plan: 3 Days Post-Op Procedure(s) (LRB): TOTAL HIP ARTHROPLASTY (Left) Up with therapy d/c home  Vince Ainsley C 10/15/2014, 11:09 AM

## 2014-10-15 NOTE — Progress Notes (Signed)
Discharge instructions and prescriptions reviewed with patient. Questions or concerns denied at this time. IV removed with no complications. Dressing changed. VS stable. Patient set up with [facility] for PT and 3 in 1 and walker delivered to room for home use. Patient discharged via wheelchair accompanied by NT with all personal belongings, prescriptions, and discharge packet.

## 2014-10-18 NOTE — Discharge Summary (Signed)
Physician Discharge Summary  Patient ID: [name]: [id number] DOB/AGE: 39/09/1975 39 y.o.  Admit date: 10/12/2014 Discharge date: 10/18/2014  Admission Diagnoses: Traumatic  osteoarthritis left hip  Discharge Diagnoses:  Active Problems:   S/P total hip arthroplasty   Discharged Condition: stable  Hospital Course: Patient's hospital course was essentially unremarkable. She underwent total hip arthroplasty. Postoperatively patient was discharged to home in stable condition.  Consults: None  Significant Diagnostic Studies: labs: Routine labs  Treatments: surgery: See operative note  Discharge Exam: Blood pressure 110/53, pulse 90, temperature 98.9 F (37.2 C), temperature source Oral, resp. rate 18, last menstrual period 08/16/2014, SpO2 100 %, unknown if currently breastfeeding. Incision/Wound: dressing clean dry and intact  Disposition: 01-Home or Self Care  Discharge Instructions    Call MD / Call 911    Complete by:  As directed   If you experience chest pain or shortness of breath, CALL 911 and be transported to the hospital emergency room.  If you develope a fever above 101 F, pus (white drainage) or increased drainage or redness at the wound, or calf pain, call your surgeon's office.     Constipation Prevention    Complete by:  As directed   Drink plenty of fluids.  Prune juice may be helpful.  You may use a stool softener, such as Colace (over the counter) 100 mg twice a day.  Use MiraLax (over the counter) for constipation as needed.     Diet - low sodium heart healthy    Complete by:  As directed      Increase activity slowly as tolerated    Complete by:  As directed      Weight bearing as tolerated    Complete by:  As directed             Medication List    TAKE these medications        aspirin EC 325 MG tablet  Take 1 tablet (325 mg total) by mouth daily.     cyclobenzaprine 10 MG tablet  Commonly known as:  FLEXERIL  Take 10 mg by mouth 3  (three) times daily as needed for muscle spasms.     gabapentin 100 MG capsule  Commonly known as:  NEURONTIN  Take 100 mg by mouth 3 (three) times daily.     ibuprofen 600 MG tablet  Commonly known as:  ADVIL,MOTRIN  Take 600 mg by mouth every 6 (six) hours as needed for mild pain.     ibuprofen 800 MG tablet  Commonly known as:  ADVIL,MOTRIN  Take 1 tablet (800 mg total) by mouth every 8 (eight) hours as needed.     insulin detemir 100 UNIT/ML injection  Commonly known as:  LEVEMIR  Inject 25 Units into the skin daily.     Liraglutide 18 MG/3ML Sopn  Inject 0.6 mLs into the skin at bedtime.     OMEGA 3 PO  Take 1 capsule by mouth daily.     oxyCODONE-acetaminophen 5-325 MG per tablet  Commonly known as:  PERCOCET/ROXICET  Take 1 tablet by mouth every 6 (six) hours as needed for severe pain.     oxyCODONE-acetaminophen 5-325 MG per tablet  Commonly known as:  ROXICET  Take 1 tablet by mouth every 4 (four) hours as needed for severe pain.     simvastatin 10 MG tablet  Commonly known as:  ZOCOR  Take 10 mg by mouth daily.     vitamin B-12 100 MCG tablet  Commonly known as:  CYANOCOBALAMIN  Take 100 mcg by mouth daily.     vitamin C 500 MG tablet  Commonly known as:  ASCORBIC ACID  Take 500 mg by mouth daily.     zolpidem 10 MG tablet  Commonly known as:  AMBIEN  Take 10 mg by mouth at bedtime as needed for sleep.           Follow-up Information    Follow up with Wanna Gully V, MD In 2 weeks.   Specialty:  Orthopedic Surgery   Contact information:   [address] [place] [address] [phone]       Follow up with [facility].   Why:  They will contact you to schedule home nurse and therapy visits.   Contact information:   [address] [place] [address] [phone]       Signed: Nadara Mustard 10/18/2014, 6:18 AM

## 2015-01-12 ENCOUNTER — Ambulatory Visit (INDEPENDENT_AMBULATORY_CARE_PROVIDER_SITE_OTHER): Payer: Medicaid Other | Admitting: Certified Nurse Midwife

## 2015-01-12 ENCOUNTER — Ambulatory Visit: Payer: Self-pay | Admitting: Certified Nurse Midwife

## 2015-01-12 ENCOUNTER — Encounter: Payer: Self-pay | Admitting: Certified Nurse Midwife

## 2015-01-12 VITALS — BP 111/74 | HR 107 | Temp 98.0°F | Ht 62.5 in | Wt 221.0 lb

## 2015-01-12 DIAGNOSIS — B9689 Other specified bacterial agents as the cause of diseases classified elsewhere: Secondary | ICD-10-CM

## 2015-01-12 DIAGNOSIS — N760 Acute vaginitis: Secondary | ICD-10-CM | POA: Diagnosis not present

## 2015-01-12 DIAGNOSIS — A499 Bacterial infection, unspecified: Secondary | ICD-10-CM | POA: Diagnosis not present

## 2015-01-12 MED ORDER — TINIDAZOLE 500 MG PO TABS: ORAL_TABLET | ORAL | Status: DC

## 2015-01-12 NOTE — Progress Notes (Addendum)
Patient ID: [name], female   DOB: [birth year], 39 y.o.   MRN: [id number]   Chief Complaint  Patient presents with  . Vaginitis    Increased discharge, yellow with odor x 4 weeks.     HPI [name] is a 39 y.o. female.  C/O yellow discharge with odor for about 3-4 weeks, with pruritus.   Sexually active, denies any new sexual partners, new soaps or medications.  Left hip replacement in January 2016.  Last pap smear 10/2013.  Had BTL several years ago, denies any problems with her menses.    HPI  Past Medical History  Diagnosis Date  . Urinary tract infection     hx of  . Diabetic neuropathy   . Arthritis   . Herpes virus disease   . Chronic back pain   . Hip fracture     left side  . Cataracts, bilateral   . Abscess 11/2012    RIGHT FINGER  . Shortness of breath dyspnea     with exertion  . Diabetes mellitus     type 1  . History of blood transfusion 2013    left  hip- pinning    Past Surgical History  Procedure Laterality Date  . Fracture surgery      surgery left hip  . Cesarean section      x 3  . Cataract extraction      left  . Cataract extraction w/phaco  06/12/2012    Procedure: CATARACT EXTRACTION PHACO AND INTRAOCULAR LENS PLACEMENT (IOC);  Surgeon: [name], MD;  Location: [facility] OR;  Service: Ophthalmology;  Laterality: Right;  . Cataract extraction w/phaco  06/15/2012    Procedure: CATARACT EXTRACTION PHACO AND INTRAOCULAR LENS PLACEMENT (IOC);  Surgeon: [name], MD;  Location: [facility] OR;  Service: Ophthalmology;  Laterality: Left;  . I&d extremity Right 11/23/2012    Procedure: IRRIGATION AND DEBRIDEMENT EXTREMITY;  Surgeon: Jodi Marble, MD;  Location: [facility] OR;  Service: Orthopedics;  Laterality: Right;  . I&d extremity Right 11/30/2012    Procedure: IRRIGATION AND DEBRIDEMENT Right hand Abscess;  Surgeon: Jodi Marble, MD;  Location: [facility] OR;  Service: Orthopedics;  Laterality: Right;  . Cesarean section with bilateral tubal ligation  Bilateral 09/10/2013    Procedure: REPEAT CESAREAN SECTION WITH BILATERAL TUBAL LIGATION;  Surgeon: Purcell Nails, MD;  Location: [facility] ORS;  Service: Obstetrics;  Laterality: Bilateral;  . Eye surgery Bilateral   . Tubal ligation    . Total hip arthroplasty Left 10/12/2014    Procedure: TOTAL HIP ARTHROPLASTY;  Surgeon: Nadara Mustard, MD;  Location: [facility] OR;  Service: Orthopedics;  Laterality: Left;    Family History  Problem Relation Age of Onset  . Diabetes Mother   . Heart disease Mother     Social History History  Substance Use Topics  . Smoking status: Never Smoker   . Smokeless tobacco: Never Used  . Alcohol Use: No    Allergies  Allergen Reactions  . Phenergan [Promethazine Hcl]     Lost vision  . Tramadol Other (See Comments)    "put her heart into shock"    Current Outpatient Prescriptions  Medication Sig Dispense Refill  . aspirin EC 325 MG tablet Take 1 tablet (325 mg total) by mouth daily. 30 tablet 0  . cyclobenzaprine (FLEXERIL) 10 MG tablet Take 10 mg by mouth 3 (three) times daily as needed for muscle spasms.    Marland Kitchen gabapentin (NEURONTIN) 100 MG capsule Take 100 mg by  mouth 3 (three) times daily.    Marland Kitchen. ibuprofen (ADVIL,MOTRIN) 600 MG tablet Take 600 mg by mouth every 6 (six) hours as needed for mild pain.    Marland Kitchen. insulin detemir (LEVEMIR) 100 UNIT/ML injection Inject 25 Units into the skin daily.    . Liraglutide 18 MG/3ML SOPN Inject 0.6 mLs into the skin at bedtime.    . Omega-3 Fatty Acids (OMEGA 3 PO) Take 1 capsule by mouth daily.    Marland Kitchen. oxyCODONE-acetaminophen (ROXICET) 5-325 MG per tablet Take 1 tablet by mouth every 4 (four) hours as needed for severe pain. 60 tablet 0  . simvastatin (ZOCOR) 10 MG tablet Take 10 mg by mouth daily.    . vitamin B-12 (CYANOCOBALAMIN) 100 MCG tablet Take 100 mcg by mouth daily.    Marland Kitchen. zolpidem (AMBIEN) 10 MG tablet Take 10 mg by mouth at bedtime as needed for sleep.    Marland Kitchen. tinidazole (TINDAMAX) 500 MG tablet Take 2 tablets in the  morning with breakfast for 5 days. 10 tablet 0   No current facility-administered medications for this visit.    Review of Systems Review of Systems Constitutional: negative for fatigue and weight loss Respiratory: negative for cough and wheezing Cardiovascular: negative for chest pain, fatigue and palpitations Gastrointestinal: negative for abdominal pain and change in bowel habits Genitourinary: + vaginal d/c with odor and itching Integument/breast: negative for nipple discharge Musculoskeletal:negative for myalgias Neurological: negative for gait problems and tremors Behavioral/Psych: negative for abusive relationship, depression Endocrine: negative for temperature intolerance     Blood pressure 111/74, pulse 107, temperature 98 F (36.7 C), height 5' 2.5" (1.588 m), weight 100.245 kg (221 lb), not currently breastfeeding.  Physical Exam Physical Exam General:   alert  Skin:   no rash or abnormalities  Lungs:   clear to auscultation bilaterally  Heart:   regular rate and rhythm, S1, S2 normal, no murmur, click, rub or gallop  Breasts:   deferred  Abdomen:  normal findings: no organomegaly, soft, non-tender and no hernia  Pelvis:  External genitalia: normal general appearance Urinary system: urethral meatus normal and bladder without fullness, nontender Vaginal: normal without tenderness, induration or masses. + thin gray discharge, + whiff test Cervix: normal appearance Adnexa: normal bimanual exam Uterus: anteverted and non-tender, normal size    50% of 30 min visit spent on counseling and coordination of care.   Data Reviewed Previous medical hx, labs, meds  Assessment     BV     Plan    No orders of the defined types were placed in this encounter.   Meds ordered this encounter  Medications  . tinidazole (TINDAMAX) 500 MG tablet    Sig: Take 2 tablets in the morning with breakfast for 5 days.    Dispense:  10 tablet    Refill:  0    Pap smear & sure swab  done today.    Follow up in a few weeks or months for annual exam

## 2015-01-12 NOTE — Addendum Note (Signed)
Addended by: Henriette CombsHATTON, Ahlayah Tarkowski L on: 01/12/2015 11:43 AM   Modules accepted: Orders

## 2015-01-15 LAB — SURESWAB, VAGINOSIS/VAGINITIS PLUS
ATOPOBIUM VAGINAE: 5.5 Log (cells/mL)
C. ALBICANS, DNA: NOT DETECTED
C. GLABRATA, DNA: NOT DETECTED
C. parapsilosis, DNA: NOT DETECTED
C. trachomatis RNA, TMA: NOT DETECTED
C. tropicalis, DNA: NOT DETECTED
Gardnerella vaginalis: 7.7 Log (cells/mL)
LACTOBACILLUS SPECIES: NOT DETECTED Log (cells/mL)
MEGASPHAERA SPECIES: NOT DETECTED Log (cells/mL)
N. gonorrhoeae RNA, TMA: NOT DETECTED
T. VAGINALIS RNA, QL TMA: DETECTED — AB

## 2015-01-16 LAB — PAP IG AND HPV HIGH-RISK: HPV DNA High Risk: NOT DETECTED

## 2015-03-13 ENCOUNTER — Other Ambulatory Visit: Payer: Self-pay

## 2015-05-02 ENCOUNTER — Ambulatory Visit: Payer: Medicaid Other | Admitting: Certified Nurse Midwife

## 2015-05-05 ENCOUNTER — Ambulatory Visit: Payer: Medicaid Other | Admitting: Certified Nurse Midwife

## 2015-05-16 ENCOUNTER — Ambulatory Visit: Payer: Medicaid Other | Admitting: Certified Nurse Midwife

## 2015-05-19 ENCOUNTER — Ambulatory Visit (INDEPENDENT_AMBULATORY_CARE_PROVIDER_SITE_OTHER): Payer: Medicaid Other | Admitting: Certified Nurse Midwife

## 2015-05-19 ENCOUNTER — Encounter: Payer: Self-pay | Admitting: Certified Nurse Midwife

## 2015-05-19 VITALS — BP 110/73 | HR 88 | Temp 98.9°F | Wt 225.2 lb

## 2015-05-19 DIAGNOSIS — N760 Acute vaginitis: Secondary | ICD-10-CM

## 2015-05-19 DIAGNOSIS — A499 Bacterial infection, unspecified: Secondary | ICD-10-CM

## 2015-05-19 DIAGNOSIS — B9689 Other specified bacterial agents as the cause of diseases classified elsewhere: Secondary | ICD-10-CM

## 2015-05-19 DIAGNOSIS — B009 Herpesviral infection, unspecified: Secondary | ICD-10-CM

## 2015-05-19 MED ORDER — TERCONAZOLE 0.4 % VA CREA: 1.0000 | TOPICAL_CREAM | Freq: Every day | VAGINAL | Status: DC

## 2015-05-19 MED ORDER — METRONIDAZOLE 0.75 % VA GEL: 1.0000 | VAGINAL | Status: DC

## 2015-05-19 MED ORDER — VALACYCLOVIR HCL 500 MG PO TABS: 500.0000 mg | ORAL_TABLET | Freq: Two times a day (BID) | ORAL | Status: DC

## 2015-05-19 NOTE — Progress Notes (Signed)
Patient ID: [name], female   DOB: [birth year], 39 y.o.   MRN: [id number]   Chief Complaint  Patient presents with  . Vaginal Discharge    HPI [name] is a 39 y.o. female.  Here for c/o yellow vaginal discharge with itching, does not think it is a yeast infection for the past 3 weeks.  Has a hx of chronic BV.  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  Has been under stress lately.    HPI  Past Medical History  Diagnosis Date  . Urinary tract infection     hx of  . Diabetic neuropathy   . Arthritis   . Herpes virus disease   . Chronic back pain   . Hip fracture     left side  . Cataracts, bilateral   . Abscess 11/2012    RIGHT FINGER  . Shortness of breath dyspnea     with exertion  . Diabetes mellitus     type 1  . History of blood transfusion 2013    left  hip- pinning    Past Surgical History  Procedure Laterality Date  . Fracture surgery      surgery left hip  . Cesarean section      x 3  . Cataract extraction      left  . Cataract extraction w/phaco  06/12/2012    Procedure: CATARACT EXTRACTION PHACO AND INTRAOCULAR LENS PLACEMENT (IOC);  Surgeon: [name], MD;  Location: [facility] OR;  Service: Ophthalmology;  Laterality: Right;  . Cataract extraction w/phaco  06/15/2012    Procedure: CATARACT EXTRACTION PHACO AND INTRAOCULAR LENS PLACEMENT (IOC);  Surgeon: [name], MD;  Location: [facility] OR;  Service: Ophthalmology;  Laterality: Left;  . I&d extremity Right 11/23/2012    Procedure: IRRIGATION AND DEBRIDEMENT EXTREMITY;  Surgeon: Jodi Marble, MD;  Location: [facility] OR;  Service: Orthopedics;  Laterality: Right;  . I&d extremity Right 11/30/2012    Procedure: IRRIGATION AND DEBRIDEMENT Right hand Abscess;  Surgeon: Jodi Marble, MD;  Location: [facility] OR;  Service: Orthopedics;  Laterality: Right;  . Cesarean section with bilateral tubal ligation Bilateral 09/10/2013     Procedure: REPEAT CESAREAN SECTION WITH BILATERAL TUBAL LIGATION;  Surgeon: Purcell Nails, MD;  Location: [facility] ORS;  Service: Obstetrics;  Laterality: Bilateral;  . Eye surgery Bilateral   . Tubal ligation    . Total hip arthroplasty Left 10/12/2014    Procedure: TOTAL HIP ARTHROPLASTY;  Surgeon: Nadara Mustard, MD;  Location: [facility] OR;  Service: Orthopedics;  Laterality: Left;    Family History  Problem Relation Age of Onset  . Diabetes Mother   . Heart disease Mother     Social History Social History  Substance Use Topics  . Smoking status: Never Smoker   . Smokeless tobacco: Never Used  . Alcohol Use: No    Allergies  Allergen Reactions  . Phenergan [Promethazine Hcl]     Lost vision  . Tramadol Other (See Comments)    "put her heart into shock"    Current Outpatient Prescriptions  Medication Sig Dispense Refill  . cyclobenzaprine (FLEXERIL) 10 MG tablet Take 10 mg by mouth 3 (three) times daily as needed for muscle spasms.    Marland Kitchen gabapentin (NEURONTIN) 100 MG capsule Take 100 mg by mouth 3 (three) times daily.    Marland Kitchen ibuprofen (ADVIL,MOTRIN) 600 MG  tablet Take 600 mg by mouth every 6 (six) hours as needed for mild pain.    Marland Kitchen insulin detemir (LEVEMIR) 100 UNIT/ML injection Inject 25 Units into the skin daily.    . Liraglutide 18 MG/3ML SOPN Inject 0.6 mLs into the skin at bedtime.    . Omega-3 Fatty Acids (OMEGA 3 PO) Take 1 capsule by mouth daily.    Marland Kitchen oxyCODONE-acetaminophen (ROXICET) 5-325 MG per tablet Take 1 tablet by mouth every 4 (four) hours as needed for severe pain. 60 tablet 0  . simvastatin (ZOCOR) 10 MG tablet Take 10 mg by mouth daily.    . vitamin B-12 (CYANOCOBALAMIN) 100 MCG tablet Take 100 mcg by mouth daily.    Marland Kitchen zolpidem (AMBIEN) 10 MG tablet Take 10 mg by mouth at bedtime as needed for sleep.    Marland Kitchen aspirin EC 325 MG tablet Take 1 tablet (325 mg total) by mouth daily. (Patient not taking: Reported on 05/19/2015) 30 tablet 0  . metroNIDAZOLE (METROGEL VAGINAL)  0.75 % vaginal gel Place 1 Applicatorful vaginally 2 (two) times a week. For 4-6 months. 70 g 4  . terconazole (TERAZOL 7) 0.4 % vaginal cream Place 1 applicator vaginally at bedtime. 45 g 3  . tinidazole (TINDAMAX) 500 MG tablet Take 2 tablets in the morning with breakfast for 5 days. (Patient not taking: Reported on 05/19/2015) 10 tablet 0  . valACYclovir (VALTREX) 500 MG tablet Take 1 tablet (500 mg total) by mouth 2 (two) times daily. For 2 weeks. 28 tablet 4   No current facility-administered medications for this visit.    Review of Systems Review of Systems Constitutional: negative for fatigue and weight loss Respiratory: negative for cough and wheezing Cardiovascular: negative for chest pain, fatigue and palpitations Gastrointestinal: negative for abdominal pain and change in bowel habits Genitourinary: + labial bump and vaginal discharge with odor Integument/breast: negative for nipple discharge Musculoskeletal:negative for myalgias Neurological: negative for gait problems and tremors Behavioral/Psych: negative for abusive relationship, depression Endocrine: negative for temperature intolerance     Blood pressure 110/73, pulse 88, temperature 98.9 F (37.2 C), weight 225 lb 3.2 oz (102.15 kg), last menstrual period 05/14/2015, not currently breastfeeding.  Physical Exam Physical Exam General:   alert  Skin:   no rash or abnormalities  Lungs:   clear to auscultation bilaterally  Heart:   regular rate and rhythm, S1, S2 normal, no murmur, click, rub or gallop  Breasts:   deferred  Abdomen:  normal findings: no organomegaly, soft, non-tender and no hernia obese  Pelvis:  External genitalia: normal general appearance, + 1 erythremic induration on below pubic bone on left side, no puss or exudate, non-tender about 7 mm in diameter. Urinary system: urethral meatus normal and bladder without fullness, nontender Vaginal: normal without tenderness, induration or masses, + thin gray  vaginal discharge + whiff test Cervix: normal appearance Adnexa: normal bimanual exam Uterus: anteverted and non-tender, normal size    50% of 15 min visit spent on counseling and coordination of care.   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 SureSwab, Vaginosis/Vaginitis Plus   Meds ordered this encounter  Medications  . metroNIDAZOLE (METROGEL VAGINAL) 0.75 % vaginal gel    Sig: Place 1 Applicatorful vaginally 2 (two) times a week. For 4-6 months.    Dispense:  70 g    Refill:  4  . valACYclovir (VALTREX) 500 MG tablet    Sig: Take 1 tablet (500 mg total) by mouth 2 (two) times daily. For 2 weeks.    Dispense:  28 tablet    Refill:  4  . terconazole (TERAZOL 7) 0.4 % vaginal cream    Sig: Place 1 applicator vaginally at bedtime.    Dispense:  45 g    Refill:  3    Follow up as needed.

## 2015-05-24 LAB — SURESWAB, VAGINOSIS/VAGINITIS PLUS
ATOPOBIUM VAGINAE: NOT DETECTED Log (cells/mL)
BV CATEGORY: UNDETERMINED — AB
C. albicans, DNA: NOT DETECTED
C. glabrata, DNA: NOT DETECTED
C. parapsilosis, DNA: NOT DETECTED
C. trachomatis RNA, TMA: NOT DETECTED
C. tropicalis, DNA: NOT DETECTED
GARDNERELLA VAGINALIS: 6.2 Log (cells/mL)
LACTOBACILLUS SPECIES: 6.7 Log (cells/mL)
MEGASPHAERA SPECIES: NOT DETECTED Log (cells/mL)
N. gonorrhoeae RNA, TMA: NOT DETECTED
T. VAGINALIS RNA, QL TMA: DETECTED — AB

## 2015-05-25 ENCOUNTER — Other Ambulatory Visit: Payer: Self-pay | Admitting: Certified Nurse Midwife

## 2015-05-25 DIAGNOSIS — A5901 Trichomonal vulvovaginitis: Secondary | ICD-10-CM

## 2015-05-25 MED ORDER — TINIDAZOLE 500 MG PO TABS
2.0000 g | ORAL_TABLET | Freq: Every day | ORAL | Status: AC
Start: 2015-05-25 — End: 2015-05-27

## 2016-04-12 ENCOUNTER — Other Ambulatory Visit: Payer: Self-pay | Admitting: Internal Medicine

## 2016-04-12 DIAGNOSIS — Z1231 Encounter for screening mammogram for malignant neoplasm of breast: Secondary | ICD-10-CM

## 2016-04-18 ENCOUNTER — Ambulatory Visit
Admission: RE | Admit: 2016-04-18 | Discharge: 2016-04-18 | Disposition: A | Discharge status: Home / Self Care | Payer: Medicaid Other | Source: Ambulatory Visit | Attending: Internal Medicine | Admitting: Internal Medicine

## 2016-04-18 DIAGNOSIS — Z1231 Encounter for screening mammogram for malignant neoplasm of breast: Secondary | ICD-10-CM

## 2016-05-22 ENCOUNTER — Ambulatory Visit: Payer: Medicaid Other | Admitting: Certified Nurse Midwife

## 2016-06-17 ENCOUNTER — Other Ambulatory Visit: Payer: Self-pay | Admitting: Certified Nurse Midwife

## 2016-06-17 DIAGNOSIS — B9689 Other specified bacterial agents as the cause of diseases classified elsewhere: Secondary | ICD-10-CM

## 2016-06-17 DIAGNOSIS — N760 Acute vaginitis: Principal | ICD-10-CM

## 2016-06-18 ENCOUNTER — Ambulatory Visit: Payer: Self-pay | Admitting: Certified Nurse Midwife

## 2016-06-24 ENCOUNTER — Encounter: Payer: Self-pay | Admitting: Certified Nurse Midwife

## 2016-06-24 ENCOUNTER — Ambulatory Visit (INDEPENDENT_AMBULATORY_CARE_PROVIDER_SITE_OTHER): Payer: Medicaid Other | Admitting: Certified Nurse Midwife

## 2016-06-24 ENCOUNTER — Encounter: Payer: Self-pay | Admitting: *Deleted

## 2016-06-24 ENCOUNTER — Other Ambulatory Visit (HOSPITAL_COMMUNITY)
Admission: RE | Admit: 2016-06-24 | Discharge: 2016-06-24 | Disposition: A | Discharge status: Home / Self Care | Payer: Medicaid Other | Source: Ambulatory Visit | Attending: Certified Nurse Midwife | Admitting: Certified Nurse Midwife

## 2016-06-24 VITALS — BP 115/78 | HR 80 | Temp 97.2°F | Wt 233.2 lb

## 2016-06-24 DIAGNOSIS — Z01419 Encounter for gynecological examination (general) (routine) without abnormal findings: Secondary | ICD-10-CM

## 2016-06-24 DIAGNOSIS — B373 Candidiasis of vulva and vagina: Secondary | ICD-10-CM

## 2016-06-24 DIAGNOSIS — Z124 Encounter for screening for malignant neoplasm of cervix: Secondary | ICD-10-CM

## 2016-06-24 DIAGNOSIS — Z113 Encounter for screening for infections with a predominantly sexual mode of transmission: Secondary | ICD-10-CM | POA: Insufficient documentation

## 2016-06-24 DIAGNOSIS — Z1151 Encounter for screening for human papillomavirus (HPV): Secondary | ICD-10-CM | POA: Diagnosis not present

## 2016-06-24 DIAGNOSIS — N760 Acute vaginitis: Secondary | ICD-10-CM | POA: Diagnosis not present

## 2016-06-24 DIAGNOSIS — N761 Subacute and chronic vaginitis: Secondary | ICD-10-CM

## 2016-06-24 DIAGNOSIS — N62 Hypertrophy of breast: Secondary | ICD-10-CM

## 2016-06-24 DIAGNOSIS — B3731 Acute candidiasis of vulva and vagina: Secondary | ICD-10-CM

## 2016-06-24 DIAGNOSIS — B9689 Other specified bacterial agents as the cause of diseases classified elsewhere: Secondary | ICD-10-CM

## 2016-06-24 MED ORDER — FLUCONAZOLE 200 MG PO TABS: 200.0000 mg | ORAL_TABLET | Freq: Every day | ORAL | 99 refills | Status: AC

## 2016-06-24 MED ORDER — TERCONAZOLE 0.4 % VA CREA: 1.0000 | TOPICAL_CREAM | Freq: Every day | VAGINAL | 3 refills | Status: AC

## 2016-06-24 MED ORDER — METRONIDAZOLE 0.75 % VA GEL: 1.0000 | VAGINAL | 4 refills | Status: AC

## 2016-06-24 NOTE — Progress Notes (Addendum)
Subjective:     [name] is a 40 y.o. female here for a routine exam.  Current complaints: none.  Just finished school.  Desires full STD screening.  Personal health questionnaire:  Is [name], have a family history of breast and/or ovarian cancer: yes, GGM ovarian & [name] Is there a family history of uterine cancer diagnosed at age < 7050, gastrointestinal cancer, urinary tract cancer, family member who is a Personnel officerLynch syndrome-associated carrier: yes, [name], MA Is the patient overweight and hypertensive, family history of diabetes, personal history of gestational diabetes, preeclampsia or PCOS: yes Is patient over 4755, have PCOS,  family history of premature CHD under age 40, diabetes, smoke, have hypertension or peripheral artery disease:  yes At any time, has a partner hit, kicked or otherwise hurt or frightened you?: no Over the past 2 weeks, have you felt down, depressed or hopeless?: no Over the past 2 weeks, have you felt little interest or pleasure in doing things?:no   Gynecologic History Patient's last menstrual period was 05/28/2016 (exact date). Contraception: abstinence and tubal ligation Last Pap: 01/12/15. Results were: normal Last mammogram: 04/18/16. Results were: normal  Obstetric History OB History  Gravida Para Term Preterm AB Living  4 3 1   1 3   SAB TAB Ectopic Multiple Live Births          1    # Outcome Date GA Lbr Len/2nd Weight Sex Delivery Anes PTL Lv  4 Term 09/10/13 [id number]  7 lb 9.3 oz (3.44 kg) M CS-LTranv EPI  LIV  3 AB           2 Para           1 Para               Past Medical History:  Diagnosis Date  . Abscess 11/2012   RIGHT FINGER  . Arthritis   . Cataracts, bilateral   . Chronic back pain   . Diabetes mellitus    type 1  . Diabetic neuropathy (HCC)   . Herpes virus disease   . Hip fracture (HCC)    left side  . History of blood transfusion 2013   left  hip- pinning  . Shortness of breath dyspnea    with exertion   . Urinary tract infection    hx of    Past Surgical History:  Procedure Laterality Date  . CATARACT EXTRACTION     left  . CATARACT EXTRACTION W/PHACO  06/12/2012   Procedure: CATARACT EXTRACTION PHACO AND INTRAOCULAR LENS PLACEMENT (IOC);  Surgeon: Shade FloodGreer Geiger, MD;  [facility];  Service: Ophthalmology;  Laterality: Right;  . CATARACT EXTRACTION W/PHACO  06/15/2012   Procedure: CATARACT EXTRACTION PHACO AND INTRAOCULAR LENS PLACEMENT (IOC);  Surgeon: Shade FloodGreer Geiger, MD;  [facility];  Service: Ophthalmology;  Laterality: Left;  . CESAREAN SECTION     x 3  . CESAREAN SECTION WITH BILATERAL TUBAL LIGATION Bilateral 09/10/2013   Procedure: REPEAT CESAREAN SECTION WITH BILATERAL TUBAL LIGATION;  Surgeon: Purcell NailsAngela Y Roberts, MD;  Location: [facility] ORS;  Service: Obstetrics;  Laterality: Bilateral;  . EYE SURGERY Bilateral   . FRACTURE SURGERY     surgery left hip  . I&D EXTREMITY Right 11/23/2012   Procedure: IRRIGATION AND DEBRIDEMENT EXTREMITY;  Surgeon: Jodi Marbleavid A Thompson, MD;  [facility];  Service: Orthopedics;  Laterality: Right;  . I&D EXTREMITY Right 11/30/2012   Procedure: IRRIGATION AND DEBRIDEMENT Right hand Abscess;  Surgeon: Cliffton Astersavid A  Janee Morn, MD;  Location: [facility] OR;  Service: Orthopedics;  Laterality: Right;  . TOTAL HIP ARTHROPLASTY Left 10/12/2014   Procedure: TOTAL HIP ARTHROPLASTY;  Surgeon: Nadara Mustard, MD;  Location: [facility] OR;  Service: Orthopedics;  Laterality: Left;  . TUBAL LIGATION       Current Outpatient Prescriptions:  .  cyclobenzaprine (FLEXERIL) 10 MG tablet, Take 10 mg by mouth 3 (three) times daily as needed for muscle spasms., Disp: , Rfl:  .  gabapentin (NEURONTIN) 100 MG capsule, Take 100 mg by mouth 3 (three) times daily., Disp: , Rfl:  .  ibuprofen (ADVIL,MOTRIN) 600 MG tablet, Take 600 mg by mouth every 6 (six) hours as needed for mild pain., Disp: , Rfl:  .  insulin detemir (LEVEMIR) 100 UNIT/ML injection, Inject 25 Units into the skin daily., Disp: , Rfl:   .  Liraglutide 18 MG/3ML SOPN, Inject 0.6 mLs into the skin at bedtime., Disp: , Rfl:  .  metroNIDAZOLE (METROGEL VAGINAL) 0.75 % vaginal gel, Place 1 Applicatorful vaginally 2 (two) times a week. For 4-6 months., Disp: 70 g, Rfl: 4 .  simvastatin (ZOCOR) 10 MG tablet, Take 10 mg by mouth daily., Disp: , Rfl:  .  vitamin B-12 (CYANOCOBALAMIN) 100 MCG tablet, Take 100 mcg by mouth daily., Disp: , Rfl:  .  zolpidem (AMBIEN) 10 MG tablet, Take 10 mg by mouth at bedtime as needed for sleep., Disp: , Rfl:  .  aspirin EC 325 MG tablet, Take 1 tablet (325 mg total) by mouth daily. (Patient not taking: Reported on 06/24/2016), Disp: 30 tablet, Rfl: 0 .  fluconazole (DIFLUCAN) 200 MG tablet, Take 1 tablet (200 mg total) by mouth daily., Disp: 3 tablet, Rfl: PRN .  Omega-3 Fatty Acids (OMEGA 3 PO), Take 1 capsule by mouth daily., Disp: , Rfl:  .  oxyCODONE-acetaminophen (ROXICET) 5-325 MG per tablet, Take 1 tablet by mouth every 4 (four) hours as needed for severe pain. (Patient not taking: Reported on 06/24/2016), Disp: 60 tablet, Rfl: 0 .  terconazole (TERAZOL 7) 0.4 % vaginal cream, Place 1 applicator vaginally at bedtime., Disp: 45 g, Rfl: 3 .  valACYclovir (VALTREX) 500 MG tablet, Take 1 tablet (500 mg total) by mouth 2 (two) times daily. For 2 weeks. (Patient not taking: Reported on 06/24/2016), Disp: 28 tablet, Rfl: 4 Allergies  Allergen Reactions  . Phenergan [Promethazine Hcl]     Lost vision  . Tramadol Other (See Comments)    "put her heart into shock"    Social History  Substance Use Topics  . Smoking status: Never Smoker  . Smokeless tobacco: Never Used  . Alcohol use No    Family History  Problem Relation Age of Onset  . Diabetes Mother   . Heart disease Mother       Review of Systems  Constitutional: negative for fatigue and weight loss Respiratory: negative for cough and wheezing Cardiovascular: negative for chest pain, fatigue and palpitations Gastrointestinal: negative for  abdominal pain and change in bowel habits Musculoskeletal:negative for myalgias Neurological: negative for gait problems and tremors Behavioral/Psych: negative for abusive relationship, depression Endocrine: negative for temperature intolerance   Genitourinary:negative for abnormal menstrual periods, genital lesions, hot flashes, sexual problems and vaginal discharge Integument/breast: negative for breast lump, breast tenderness, nipple discharge and skin lesion(s)    Objective:       BP 115/78   Pulse 80   Temp 97.2 F (36.2 C)   Wt 233 lb 3.2 oz (105.8 kg)   LMP  05/28/2016 (Exact Date)   BMI 41.97 kg/m  General:   alert  Skin:   no rash or abnormalities  Lungs:   clear to auscultation bilaterally  Heart:   regular rate and rhythm, S1, S2 normal, no murmur, click, rub or gallop  Breasts:   normal without suspicious masses, skin or nipple changes or axillary nodes, very large  Abdomen:  normal findings: no organomegaly, soft, non-tender and no hernia  Pelvis:  External genitalia: normal general appearance Urinary system: urethral meatus normal and bladder without fullness, nontender Vaginal: normal without tenderness, induration or masses Cervix: normal appearance Adnexa: normal bimanual exam Uterus: anteverted and non-tender, normal size   Lab Review Urine pregnancy test Labs reviewed yes Radiologic studies reviewed yes  50% of 30 min visit spent on counseling and coordination of care.   Assessment:    Healthy female exam.   DM1  obeisty  large pendulous breasts: with severe back pain  yeast vaginits  Plan:    Education reviewed: calcium supplements, depression evaluation, low fat, low cholesterol diet, safe sex/STD prevention, self breast exams, skin cancer screening and weight bearing exercise. Contraception: tubal ligation. Follow up in: 1 year.   Meds ordered this encounter  Medications  . metroNIDAZOLE (METROGEL VAGINAL) 0.75 % vaginal gel    Sig: Place  1 Applicatorful vaginally 2 (two) times a week. For 4-6 months.    Dispense:  70 g    Refill:  4  . terconazole (TERAZOL 7) 0.4 % vaginal cream    Sig: Place 1 applicator vaginally at bedtime.    Dispense:  45 g    Refill:  3  . fluconazole (DIFLUCAN) 200 MG tablet    Sig: Take 1 tablet (200 mg total) by mouth daily.    Dispense:  3 tablet    Refill:  PRN   Orders Placed This Encounter  Procedures  . RPR  . Hepatitis C antibody  . Hepatitis B surface antigen  . HIV antibody  . Consult to general surgery    Needs to see Dr. Odis Luster for breast reduction. [address], [place], [place] [address]   Need to obtain previous records Possible management options include: Boric Acid vag. Supp.  Follow up as needed.

## 2016-06-25 LAB — HIV ANTIBODY (ROUTINE TESTING W REFLEX): HIV SCREEN 4TH GENERATION: NONREACTIVE

## 2016-06-25 LAB — RPR: RPR: NONREACTIVE

## 2016-06-25 LAB — CERVICOVAGINAL ANCILLARY ONLY
CHLAMYDIA, DNA PROBE: NEGATIVE
Neisseria Gonorrhea: NEGATIVE
Trichomonas: POSITIVE — AB

## 2016-06-25 LAB — HEPATITIS C ANTIBODY

## 2016-06-25 LAB — CYTOLOGY - PAP

## 2016-06-25 LAB — HEPATITIS B SURFACE ANTIGEN: Hepatitis B Surface Ag: NEGATIVE

## 2016-06-26 ENCOUNTER — Other Ambulatory Visit: Payer: Self-pay | Admitting: Certified Nurse Midwife

## 2016-06-26 DIAGNOSIS — A599 Trichomoniasis, unspecified: Secondary | ICD-10-CM

## 2016-06-26 MED ORDER — TINIDAZOLE 500 MG PO TABS: 2.0000 g | ORAL_TABLET | Freq: Once | ORAL | 0 refills | Status: AC

## 2016-06-27 LAB — CERVICOVAGINAL ANCILLARY ONLY
Bacterial vaginitis: NEGATIVE
Candida vaginitis: NEGATIVE

## 2016-09-05 ENCOUNTER — Ambulatory Visit (INDEPENDENT_AMBULATORY_CARE_PROVIDER_SITE_OTHER): Payer: Medicaid Other | Admitting: Orthopedic Surgery

## 2017-07-17 ENCOUNTER — Other Ambulatory Visit: Payer: Self-pay | Admitting: Pediatrics

## 2017-07-17 DIAGNOSIS — B009 Herpesviral infection, unspecified: Secondary | ICD-10-CM

## 2017-07-18 MED ORDER — VALACYCLOVIR HCL 500 MG PO TABS: 500.0000 mg | ORAL_TABLET | Freq: Two times a day (BID) | ORAL | 4 refills | Status: AC

## 2017-10-06 ENCOUNTER — Telehealth: Payer: Self-pay | Admitting: *Deleted

## 2017-10-06 NOTE — Telephone Encounter (Signed)
Spoke w/ patient to schedule Annual/pap and she stated she would  have to call back and schedule she was working..Marland Kitchen

## 2017-11-27 ENCOUNTER — Encounter: Payer: Self-pay | Admitting: *Deleted

## 2018-03-17 NOTE — Progress Notes (Signed)
[facility] - Clinic Note  03/18/2018     CHIEF COMPLAINT Patient presents for Retina Evaluation and Diabetic Eye Exam   HISTORY OF PRESENT ILLNESS: [name] is a 42 y.o. female who presents to the clinic today for:   HPI    Retina Evaluation    In both eyes.  This started 2 years ago.  Associated Symptoms Pain and Photophobia.  Negative for Redness, Distortion, Trauma, Shoulder/Hip pain, Fatigue, Weight Loss, Jaw Claudication, Glare, Floaters, Flashes, Blind Spot, Scalp Tenderness and Fever.  Context:  distance vision, mid-range vision and near vision.  Treatments tried include laser and surgery.  Response to treatment was mild improvement.  I, the attending physician,  performed the HPI with the patient and updated documentation appropriately.          Diabetic Eye Exam    Vision fluctuates with blood sugars.  Associated Symptoms Negative for Distortion, Redness, Trauma, Shoulder/Hip pain, Fatigue, Weight Loss, Jaw Claudication, Glare, Pain, Floaters, Flashes, Blind Spot, Photophobia, Scalp Tenderness and Fever.  Diabetes characteristics include Type 1.  This started 32.  Blood sugar level fluctuates.  Last Blood Glucose 204.  Last A1C 10.  Associated Diagnosis Neuropathy.  I, the attending physician,  performed the HPI with the patient and updated documentation appropriately.          Comments    Pt arrived today for DME. Patient states she has been a diabetic for 32 yrs, her BS are unstable, Bs this am 204, A1C 10  appx a month ago, she is on insulin and Jardiance, patient denies visual changes with fluctuating Bs. Pt states she has had blurry vision for appx 2 yrs and occasional sharpe pains OU, she had laser tx Ou 2017 with some visual improvement and cataract sx ou (year Unknown) with some visual improvement. Pt takes vit's qd, denies gtt's .       Last edited by Rennis Chris, MD on 03/18/2018  1:17 PM. (History)    Pt states she was a pt of Dr.  Luciana Axe but states he no longer takes medicaid; Pt states she had a laser done by Dr. Luciana Axe; Pt states she had cataract sx OU "years ago" by Dr. Mitzi Davenport; Pt states Dr. Luciana Axe did laser surgery for "fluid pockets in the back of my eyes";   Referring physician: Fleet Contras, MD [address] [place], [place] [address]  HISTORICAL INFORMATION:   Selected notes from the MEDICAL RECORD NUMBER Referred by Dr. Concepcion Elk for DM exam; LEE- 05.25.19 ([name]) {BCVA OD: 20/40 OS: 20/60] Ocular Hx- pseudophakia OU, YAG Cap OU PMH- DM (on insulin and jardiance, last A1C - unknown), neuropathy, peripheral neuropathy, herpes, HTN, high cholesterol, arthritis    CURRENT MEDICATIONS: Current Outpatient Medications (Ophthalmic Drugs)  Medication Sig  . prednisoLONE acetate (PRED FORTE) 1 % ophthalmic suspension Place 1 drop into the left eye 4 (four) times daily for 7 days.   No current facility-administered medications for this visit.  (Ophthalmic Drugs)   Current Outpatient Medications (Other)  Medication Sig  . aspirin EC 325 MG tablet Take 1 tablet (325 mg total) by mouth daily.  . cyclobenzaprine (FLEXERIL) 10 MG tablet Take 10 mg by mouth 3 (three) times daily as needed for muscle spasms.  . ferrous sulfate 325 (65 FE) MG tablet Take 325 mg by mouth daily with breakfast.  . fluconazole (DIFLUCAN) 200 MG tablet Take 1 tablet (200 mg total) by mouth daily.  Marland Kitchen gabapentin (NEURONTIN) 100 MG capsule Take  100 mg by mouth 3 (three) times daily.  Marland Kitchen ibuprofen (ADVIL,MOTRIN) 600 MG tablet Take 600 mg by mouth every 6 (six) hours as needed for mild pain.  Marland Kitchen insulin detemir (LEVEMIR) 100 UNIT/ML injection Inject 25 Units into the skin daily.  . Liraglutide 18 MG/3ML SOPN Inject 0.6 mLs into the skin at bedtime.  . metroNIDAZOLE (METROGEL VAGINAL) 0.75 % vaginal gel Place 1 Applicatorful vaginally 2 (two) times a week. For 4-6 months.  . Multiple Vitamin (MULTIVITAMIN WITH MINERALS) TABS tablet Take 1 tablet  by mouth daily.  . Omega-3 Fatty Acids (OMEGA 3 PO) Take 1 capsule by mouth daily.  Marland Kitchen oxyCODONE-acetaminophen (ROXICET) 5-325 MG per tablet Take 1 tablet by mouth every 4 (four) hours as needed for severe pain.  . simvastatin (ZOCOR) 10 MG tablet Take 10 mg by mouth daily.  Marland Kitchen terconazole (TERAZOL 7) 0.4 % vaginal cream Place 1 applicator vaginally at bedtime.  . valACYclovir (VALTREX) 500 MG tablet Take 1 tablet (500 mg total) by mouth 2 (two) times daily. For 2 weeks.  . vitamin B-12 (CYANOCOBALAMIN) 100 MCG tablet Take 100 mcg by mouth daily.  Marland Kitchen zolpidem (AMBIEN) 10 MG tablet Take 10 mg by mouth at bedtime as needed for sleep.   No current facility-administered medications for this visit.  (Other)      REVIEW OF SYSTEMS: ROS    Positive for: Endocrine, Eyes   Negative for: Constitutional, Gastrointestinal, Neurological, Skin, Genitourinary, Musculoskeletal, HENT, Cardiovascular, Respiratory, Psychiatric, Allergic/Imm, Heme/Lymph   Last edited by Eldridge Scot, LPN on 09/21/1094 10:29 AM. (History)       ALLERGIES Allergies  Allergen Reactions  . Phenergan [Promethazine Hcl]     Lost vision  . Tramadol Other (See Comments)    "put her heart into shock"    PAST MEDICAL HISTORY Past Medical History:  Diagnosis Date  . Abscess 11/2012   RIGHT FINGER  . Arthritis   . Cataracts, bilateral   . Chronic back pain   . Diabetes mellitus    type 1  . Diabetic neuropathy (HCC)   . Herpes virus disease   . Hip fracture (HCC)    left side  . History of blood transfusion 2013   left  hip- pinning  . Shortness of breath dyspnea    with exertion  . Urinary tract infection    hx of   Past Surgical History:  Procedure Laterality Date  . CATARACT EXTRACTION     left  . CATARACT EXTRACTION W/PHACO  06/12/2012   Procedure: CATARACT EXTRACTION PHACO AND INTRAOCULAR LENS PLACEMENT (IOC);  Surgeon: [name], MD;  Location: [facility] OR;  Service: Ophthalmology;  Laterality: Right;  .  CATARACT EXTRACTION W/PHACO  06/15/2012   Procedure: CATARACT EXTRACTION PHACO AND INTRAOCULAR LENS PLACEMENT (IOC);  Surgeon: [name], MD;  Location: [facility] OR;  Service: Ophthalmology;  Laterality: Left;  . CESAREAN SECTION     x 3  . CESAREAN SECTION WITH BILATERAL TUBAL LIGATION Bilateral 09/10/2013   Procedure: REPEAT CESAREAN SECTION WITH BILATERAL TUBAL LIGATION;  Surgeon: Purcell Nails, MD;  Location: [facility] ORS;  Service: Obstetrics;  Laterality: Bilateral;  . EYE SURGERY Bilateral   . FRACTURE SURGERY     surgery left hip  . I&D EXTREMITY Right 11/23/2012   Procedure: IRRIGATION AND DEBRIDEMENT EXTREMITY;  Surgeon: Jodi Marble, MD;  Location: [facility] OR;  Service: Orthopedics;  Laterality: Right;  . I&D EXTREMITY Right 11/30/2012   Procedure: IRRIGATION AND DEBRIDEMENT Right hand Abscess;  Surgeon: Onalee Hua  Allegra GranaA Thompson, MD;  Location: [facility] OR;  Service: Orthopedics;  Laterality: Right;  . TOTAL HIP ARTHROPLASTY Left 10/12/2014   Procedure: TOTAL HIP ARTHROPLASTY;  Surgeon: Nadara MustardMarcus Duda V, MD;  Location: [facility] OR;  Service: Orthopedics;  Laterality: Left;  . TUBAL LIGATION      FAMILY HISTORY Family History  Problem Relation Age of Onset  . Diabetes Mother   . Heart disease Mother     SOCIAL HISTORY Social History   Tobacco Use  . Smoking status: Never Smoker  . Smokeless tobacco: Never Used  Substance Use Topics  . Alcohol use: No    Alcohol/week: 0.0 oz  . Drug use: No         OPHTHALMIC EXAM:  Base Eye Exam    Visual Acuity (Snellen - Linear)      Right Left   Dist [place] 20/50 20/40   Dist ph [place] 20/30 NI       Tonometry (Tonopen, 10:28 AM)      Right Left   Pressure 15 16       Pupils      Dark Light Shape React APD   Right 3 2 Round Brisk None   Left 3 2 Round Brisk None       Visual Fields (Counting fingers)      Left Right    Full        Extraocular Movement      Right Left    Full, Ortho Full, Ortho       Neuro/Psych    Oriented x3:  Yes    Mood/Affect:  Normal       Dilation    Both eyes:  1.0% Mydriacyl, Paremyd @ 10:28 AM        Slit Lamp and Fundus Exam    Slit Lamp Exam      Right Left   Lids/Lashes Normal Normal   Conjunctiva/Sclera Mild Inferior Melanosis Mild Melanosis   Cornea Mild Arcus Mild Arcus   Anterior Chamber Deep and quiet Deep and quiet   Iris Round and dilated, No NVI Round and dilated, No NVI   Lens Posterior chamber intraocular lens, anterior capsular fimosis, mild vitreous prolaps at 0300, 2+ PCO with small PC opening PC IOL in good position, 2+ PCO with small PC opening   Vitreous Vitreous syneresis Vitreous syneresis       Fundus Exam      Right Left   Disc Pink and Sharp Pink and Sharp   C/D Ratio 0.2 0.1   Macula Flat, good foveal reflex, No heme or edema Flat, Good foveal reflex, No heme or edema   Vessels Mildly Tortuous, mild AV crossing changes Mildly Tortuous, mild AV crossing changes   Periphery Attached, few scattered IRH and MAs Attached        Refraction    Manifest Refraction      Dist VA   Right 20/40   Left 20/30          IMAGING AND PROCEDURES  Imaging and Procedures for @TODAY @  OCT, Retina - OU - Both Eyes       Right Eye Quality was good. Central Foveal Thickness: 273. Progression has no prior data. Findings include normal foveal contour, no IRF, no SRF (Trace scattered cystic changes).   Left Eye Quality was good. Central Foveal Thickness: 277. Progression has no prior data. Findings include normal foveal contour, no IRF, no SRF.   Notes *Images captured and stored on drive  Diagnosis /  Impression:  No CSME OU trace cystic changes non central OD  Clinical management:  See below  Abbreviations: NFP - Normal foveal profile. CME - cystoid macular edema. PED - pigment epithelial detachment. IRF - intraretinal fluid. SRF - subretinal fluid. EZ - ellipsoid zone. ERM - epiretinal membrane. ORA - outer retinal atrophy. ORT - outer retinal tubulation. SRHM  - subretinal hyper-reflective material         Yag Capsulotomy - OS - Left Eye       Procedure note: YAG Capsulotomy, LEFT Eye  Informed consent obtained. Pre-op dilating drops (1% Topicamide and 2.5% Phenylephrine), and topical anesthesia given. Power: 7.4 mJ Shots: 24 Posterior capsulotomy in cruciate formation performed without difficulty. Patient tolerated procedure well. No complications. Rx pred forte 4 times a day for 7 days, then stop. Pt received written and verbal post laser education. Recheck in 2 weeks w/ dilated exam.                 ASSESSMENT/PLAN:    ICD-10-CM   1. Mild nonproliferative diabetic retinopathy of both eyes without macular edema associated with type 2 diabetes mellitus (HCC) Z61.0960   2. Retinal edema H35.81 OCT, Retina - OU - Both Eyes  3. Essential hypertension I10   4. Hypertensive retinopathy of both eyes H35.033   5. Pseudophakia of both eyes Z96.1   6. PCO (posterior capsular opacification), bilateral H26.493 Yag Capsulotomy - OS - Left Eye    1,2. Mild Non-proliferative diabetic retinopathy w/o DME, OU - The incidence, risk factors for progression, natural history and treatment options for diabetic retinopathy were discussed with patient.   - The need for close monitoring of blood glucose, blood pressure, and serum lipids, avoiding cigarette or any type of tobacco, and the need for long term follow up was also discussed with patient. - exam with few peripheral IRH/MAs - OCT without clinically significant diabetic macular edema OU -- mild noncentral cystic changes OD  3,4. Hypertensive retinopathy OU - discussed importance of tight BP control - monitor  5. Pseudophakia OU  - s/p CE/IOL OU w/ Dr. Clarisa Kindred -- stable  - anterior capsular phimosis OU (OD > OS)  - mild vitreous prolapse OD -- appears stable  - monitor  6. PCO OU-  - OS>OD - s/p YAG cap OU with [name] -- small openings - recommended touch up today OS - pt  wishes to proceed - RBA of procedure discussed, questions answered - informed consent obtained and signed - see procedure note - start PF QID OS x7 days - F/U 2 weeks   Ophthalmic Meds Ordered this visit:  Meds ordered this encounter  Medications  . prednisoLONE acetate (PRED FORTE) 1 % ophthalmic suspension    Sig: Place 1 drop into the left eye 4 (four) times daily for 7 days.    Dispense:  10 mL    Refill:  0       Return in about 2 weeks (around 04/01/2018) for F/U YAG OS, DFE.  There are no Patient Instructions on file for this visit.   Explained the diagnoses, plan, and follow up with the patient and they expressed understanding.  Patient expressed understanding of the importance of proper follow up care.   This document serves as a record of services personally performed by Karie Chimera, MD, PhD. It was created on their behalf by Virgilio Belling, COA, a certified ophthalmic assistant. The creation of this record is the provider's dictation and/or activities during the visit.  Electronically signed by: Virgilio Belling, COA  07.02.19 1:17 PM   Karie Chimera, M.D., Ph.D. Diseases & Surgery of the Retina and Vitreous [facility]   I have reviewed the above documentation for accuracy and completeness, and I agree with the above. Karie Chimera, M.D., Ph.D. 03/18/18 1:21 PM   Abbreviations: M myopia (nearsighted); A astigmatism; H hyperopia (farsighted); P presbyopia; Mrx spectacle prescription;  CTL contact lenses; OD right eye; OS left eye; OU both eyes  XT exotropia; ET esotropia; PEK punctate epithelial keratitis; PEE punctate epithelial erosions; DES dry eye syndrome; MGD meibomian gland dysfunction; ATs artificial tears; PFAT's preservative free artificial tears; NSC nuclear sclerotic cataract; PSC posterior subcapsular cataract; ERM epi-retinal membrane; PVD posterior vitreous detachment; RD retinal detachment; DM diabetes mellitus; DR  diabetic retinopathy; NPDR non-proliferative diabetic retinopathy; PDR proliferative diabetic retinopathy; CSME clinically significant macular edema; DME diabetic macular edema; dbh dot blot hemorrhages; CWS cotton wool spot; POAG primary open angle glaucoma; C/D cup-to-disc ratio; HVF humphrey visual field; GVF goldmann visual field; OCT optical coherence tomography; IOP intraocular pressure; BRVO Branch retinal vein occlusion; CRVO central retinal vein occlusion; CRAO central retinal artery occlusion; BRAO branch retinal artery occlusion; RT retinal tear; SB scleral buckle; PPV pars plana vitrectomy; VH Vitreous hemorrhage; PRP panretinal laser photocoagulation; IVK intravitreal kenalog; VMT vitreomacular traction; MH Macular hole;  NVD neovascularization of the disc; NVE neovascularization elsewhere; AREDS age related eye disease study; ARMD age related macular degeneration; POAG primary open angle glaucoma; EBMD epithelial/anterior basement membrane dystrophy; ACIOL anterior chamber intraocular lens; IOL intraocular lens; PCIOL posterior chamber intraocular lens; Phaco/IOL phacoemulsification with intraocular lens placement; PRK photorefractive keratectomy; LASIK laser assisted in situ keratomileusis; HTN hypertension; DM diabetes mellitus; COPD chronic obstructive pulmonary disease

## 2018-03-18 ENCOUNTER — Ambulatory Visit (INDEPENDENT_AMBULATORY_CARE_PROVIDER_SITE_OTHER): Payer: Medicare Other | Admitting: Ophthalmology

## 2018-03-18 ENCOUNTER — Encounter (INDEPENDENT_AMBULATORY_CARE_PROVIDER_SITE_OTHER): Payer: Self-pay | Admitting: Ophthalmology

## 2018-03-18 DIAGNOSIS — H26493 Other secondary cataract, bilateral: Secondary | ICD-10-CM

## 2018-03-18 DIAGNOSIS — E113293 Type 2 diabetes mellitus with mild nonproliferative diabetic retinopathy without macular edema, bilateral: Secondary | ICD-10-CM

## 2018-03-18 DIAGNOSIS — Z961 Presence of intraocular lens: Secondary | ICD-10-CM

## 2018-03-18 DIAGNOSIS — H3581 Retinal edema: Secondary | ICD-10-CM

## 2018-03-18 DIAGNOSIS — I10 Essential (primary) hypertension: Secondary | ICD-10-CM

## 2018-03-18 DIAGNOSIS — H35033 Hypertensive retinopathy, bilateral: Secondary | ICD-10-CM | POA: Diagnosis not present

## 2018-03-18 MED ORDER — PREDNISOLONE ACETATE 1 % OP SUSP: 1.0000 [drp] | Freq: Four times a day (QID) | OPHTHALMIC | 0 refills | Status: AC

## 2018-03-31 NOTE — Progress Notes (Signed)
[facility] - Clinic Note  04/01/2018     CHIEF COMPLAINT Patient presents for Post-op Follow-up   HISTORY OF PRESENT ILLNESS: [name] is a 42 y.o. female who presents to the clinic today for:   HPI    Post-op Follow-up    In left eye.  Discomfort includes pain.  Vision is improved.  I, the attending physician,  performed the HPI with the patient and updated documentation appropriately.          Comments    42 y/o female pt here for 2 wk po s/p yag os.  VA OS may be slightly better since at last visit.  No change in vision OD.  Denies flashes, floaters, but has intermittent sharp pain os that lasts for about 1 min.  Last occurrence x 1 wk.  Pred qid OS.  * Pt took some sleeping pills early this a.m., and is extremely drowsy.       Last edited by Rennis Chris, MD on 04/01/2018 11:07 AM. (History)    Pt states she noticed improvement in OS VA; Pt states she completed gtts as directed;   Referring physician: Fleet Contras, MD [address] [place], [place] [address]  HISTORICAL INFORMATION:   Selected notes from the MEDICAL RECORD NUMBER Referred by Dr. Concepcion Elk for DM exam; LEE- 05.25.19 ([name]) {BCVA OD: 20/40 OS: 20/60] Ocular Hx- pseudophakia OU, YAG Cap OU PMH- DM (on insulin and jardiance, last A1C - unknown), neuropathy, peripheral neuropathy, herpes, HTN, high cholesterol, arthritis    CURRENT MEDICATIONS: No current outpatient medications on file. (Ophthalmic Drugs)   No current facility-administered medications for this visit.  (Ophthalmic Drugs)   Current Outpatient Medications (Other)  Medication Sig  . aspirin EC 325 MG tablet Take 1 tablet (325 mg total) by mouth daily.  . cyclobenzaprine (FLEXERIL) 10 MG tablet Take 10 mg by mouth 3 (three) times daily as needed for muscle spasms.  . ferrous sulfate 325 (65 FE) MG tablet Take 325 mg by mouth daily with breakfast.  . fluconazole (DIFLUCAN) 200 MG tablet Take 1 tablet (200  mg total) by mouth daily.  Marland Kitchen gabapentin (NEURONTIN) 100 MG capsule Take 100 mg by mouth 3 (three) times daily.  Marland Kitchen ibuprofen (ADVIL,MOTRIN) 600 MG tablet Take 600 mg by mouth every 6 (six) hours as needed for mild pain.  Marland Kitchen insulin detemir (LEVEMIR) 100 UNIT/ML injection Inject 25 Units into the skin daily.  . Liraglutide 18 MG/3ML SOPN Inject 0.6 mLs into the skin at bedtime.  . metroNIDAZOLE (METROGEL VAGINAL) 0.75 % vaginal gel Place 1 Applicatorful vaginally 2 (two) times a week. For 4-6 months.  . Multiple Vitamin (MULTIVITAMIN WITH MINERALS) TABS tablet Take 1 tablet by mouth daily.  . Omega-3 Fatty Acids (OMEGA 3 PO) Take 1 capsule by mouth daily.  Marland Kitchen oxyCODONE-acetaminophen (ROXICET) 5-325 MG per tablet Take 1 tablet by mouth every 4 (four) hours as needed for severe pain.  . simvastatin (ZOCOR) 10 MG tablet Take 10 mg by mouth daily.  Marland Kitchen terconazole (TERAZOL 7) 0.4 % vaginal cream Place 1 applicator vaginally at bedtime.  . valACYclovir (VALTREX) 500 MG tablet Take 1 tablet (500 mg total) by mouth 2 (two) times daily. For 2 weeks.  . vitamin B-12 (CYANOCOBALAMIN) 100 MCG tablet Take 100 mcg by mouth daily.  Marland Kitchen zolpidem (AMBIEN) 10 MG tablet Take 10 mg by mouth at bedtime as needed for sleep.   No current facility-administered medications for this visit.  (Other)  REVIEW OF SYSTEMS: ROS    Positive for: Eyes   Negative for: Constitutional, Gastrointestinal, Neurological, Skin, Genitourinary, Musculoskeletal, HENT, Endocrine, Cardiovascular, Respiratory, Psychiatric, Allergic/Imm, Heme/Lymph   Last edited by Celine MansBaxley, Andrew G, COA on 04/01/2018 10:23 AM. (History)       ALLERGIES Allergies  Allergen Reactions  . Phenergan [Promethazine Hcl]     Lost vision  . Tramadol Other (See Comments)    "put her heart into shock"    PAST MEDICAL HISTORY Past Medical History:  Diagnosis Date  . Abscess 11/2012   RIGHT FINGER  . Arthritis   . Cataracts, bilateral   . Chronic back  pain   . Diabetes mellitus    type 1  . Diabetic neuropathy (HCC)   . Herpes virus disease   . Hip fracture (HCC)    left side  . History of blood transfusion 2013   left  hip- pinning  . Shortness of breath dyspnea    with exertion  . Urinary tract infection    hx of   Past Surgical History:  Procedure Laterality Date  . CATARACT EXTRACTION     left  . CATARACT EXTRACTION W/PHACO  06/12/2012   Procedure: CATARACT EXTRACTION PHACO AND INTRAOCULAR LENS PLACEMENT (IOC);  Surgeon: Shade FloodGreer Geiger, MD;  [facility];  Service: Ophthalmology;  Laterality: Right;  . CATARACT EXTRACTION W/PHACO  06/15/2012   Procedure: CATARACT EXTRACTION PHACO AND INTRAOCULAR LENS PLACEMENT (IOC);  Surgeon: Shade FloodGreer Geiger, MD;  [facility];  Service: Ophthalmology;  Laterality: Left;  . CESAREAN SECTION     x 3  . CESAREAN SECTION WITH BILATERAL TUBAL LIGATION Bilateral 09/10/2013   Procedure: REPEAT CESAREAN SECTION WITH BILATERAL TUBAL LIGATION;  Surgeon: Purcell NailsAngela Y Roberts, MD;  Location: [facility] ORS;  Service: Obstetrics;  Laterality: Bilateral;  . EYE SURGERY Bilateral   . FRACTURE SURGERY     surgery left hip  . I&D EXTREMITY Right 11/23/2012   Procedure: IRRIGATION AND DEBRIDEMENT EXTREMITY;  Surgeon: Jodi Marbleavid A Thompson, MD;  [facility];  Service: Orthopedics;  Laterality: Right;  . I&D EXTREMITY Right 11/30/2012   Procedure: IRRIGATION AND DEBRIDEMENT Right hand Abscess;  Surgeon: Jodi Marbleavid A Thompson, MD;  [facility];  Service: Orthopedics;  Laterality: Right;  . TOTAL HIP ARTHROPLASTY Left 10/12/2014   Procedure: TOTAL HIP ARTHROPLASTY;  Surgeon: Nadara MustardMarcus Duda V, MD;  Location: [facility] OR;  Service: Orthopedics;  Laterality: Left;  . TUBAL LIGATION      FAMILY HISTORY Family History  Problem Relation Age of Onset  . Diabetes Mother   . Heart disease Mother     SOCIAL HISTORY Social History   Tobacco Use  . Smoking status: Never Smoker  . Smokeless tobacco: Never Used  Substance Use Topics   . Alcohol use: No    Alcohol/week: 0.0 oz  . Drug use: No         OPHTHALMIC EXAM:  Base Eye Exam    Visual Acuity (Snellen - Linear)      Right Left   Dist [place] 20/50 20/40   Dist ph [place] 20/30 NI       Tonometry (Tonopen, 10:31 AM)      Right Left   Pressure 19 18  Squeezing       Pupils      Dark Light Shape React APD   Right 3 2 Round Brisk None   Left 3 2 Round Brisk None       Visual Fields (Counting fingers)  Left Right    Full Full       Extraocular Movement      Right Left    Full, Ortho Full, Ortho       Neuro/Psych    Oriented x3:  Yes   Mood/Affect:  Normal       Dilation    Left eye:  1.0% Mydriacyl, 2.5% Phenylephrine @ 10:31 AM        Slit Lamp and Fundus Exam    Slit Lamp Exam      Right Left   Lids/Lashes Normal Normal   Conjunctiva/Sclera Mild Inferior Melanosis Mild Melanosis   Cornea Mild Arcus Mild Arcus   Anterior Chamber Deep and quiet Deep and quiet   Iris Round and dilated, No NVI Round and dilated, No NVI   Lens Posterior chamber intraocular lens, anterior capsular fimosis, mild vitreous prolaps at 0300, 2+ PCO with small PC opening PC IOL in good position, Open PC   Vitreous Vitreous syneresis Vitreous syneresis       Fundus Exam      Right Left   Disc  Pink and Sharp   C/D Ratio 0.2 0.1   Macula  Flat, Good foveal reflex, No heme or edema   Vessels  Mildly Tortuous, mild AV crossing changes   Periphery  Attached          IMAGING AND PROCEDURES  Imaging and Procedures for @TODAY @           ASSESSMENT/PLAN:    ICD-10-CM   1. Mild nonproliferative diabetic retinopathy of both eyes without macular edema associated with type 2 diabetes mellitus (HCC) Z61.0960   2. Retinal edema H35.81   3. Essential hypertension I10   4. Hypertensive retinopathy of both eyes H35.033   5. Pseudophakia of both eyes Z96.1   6. PCO (posterior capsular opacification), bilateral H26.493     1,2. Mild Non-proliferative  diabetic retinopathy w/o DME, OU - The incidence, risk factors for progression, natural history and treatment options for diabetic retinopathy were discussed with patient.   - The need for close monitoring of blood glucose, blood pressure, and serum lipids, avoiding cigarette or any type of tobacco, and the need for long term follow up was also discussed with patient. - exam with few peripheral IRH/MAs - OCT without clinically significant diabetic macular edema OU -- mild noncentral cystic changes OD - F/U 4 weeks as scheduled, DFE OU, OCT  3,4. Hypertensive retinopathy OU - discussed importance of tight BP control - monitor  5. Pseudophakia OU  - s/p CE/IOL OU w/ Dr. Clarisa Kindred -- stable  - anterior capsular phimosis OU (OD > OS)  - mild vitreous prolapse OD -- appears stable  - monitor  6. PCO OU-  - OS>OD - s/p YAG cap OU with [name] -- small openings - S/P YAG cap touch up OS (07.03.19) -- improved PC opening - subjective improvement in vision OS - finished PF QID OS x7 days - F/U 4 weeks dilate OU -- possible YAG OD   Ophthalmic Meds Ordered this visit:  No orders of the defined types were placed in this encounter.      Return in about 1 month (around 04/29/2018) for F/U PCO OD, DFE OU, OCT.  There are no Patient Instructions on file for this visit.   Explained the diagnoses, plan, and follow up with the patient and they expressed understanding.  Patient expressed understanding of the importance of proper follow up care.   This document serves as  a record of services personally performed by Karie Chimera, MD, PhD. It was created on their behalf by Virgilio Belling, COA, a certified ophthalmic assistant. The creation of this record is the provider's dictation and/or activities during the visit.  Electronically signed by: Virgilio Belling, COA  07.16.19 2:28 PM    Karie Chimera, M.D., Ph.D. Diseases & Surgery of the Retina and Vitreous [facility]  I have reviewed the above documentation for accuracy and completeness, and I agree with the above. Karie Chimera, M.D., Ph.D. 04/02/18 2:29 PM   Abbreviations: M myopia (nearsighted); A astigmatism; H hyperopia (farsighted); P presbyopia; Mrx spectacle prescription;  CTL contact lenses; OD right eye; OS left eye; OU both eyes  XT exotropia; ET esotropia; PEK punctate epithelial keratitis; PEE punctate epithelial erosions; DES dry eye syndrome; MGD meibomian gland dysfunction; ATs artificial tears; PFAT's preservative free artificial tears; NSC nuclear sclerotic cataract; PSC posterior subcapsular cataract; ERM epi-retinal membrane; PVD posterior vitreous detachment; RD retinal detachment; DM diabetes mellitus; DR diabetic retinopathy; NPDR non-proliferative diabetic retinopathy; PDR proliferative diabetic retinopathy; CSME clinically significant macular edema; DME diabetic macular edema; dbh dot blot hemorrhages; CWS cotton wool spot; POAG primary open angle glaucoma; C/D cup-to-disc ratio; HVF humphrey visual field; GVF goldmann visual field; OCT optical coherence tomography; IOP intraocular pressure; BRVO Branch retinal vein occlusion; CRVO central retinal vein occlusion; CRAO central retinal artery occlusion; BRAO branch retinal artery occlusion; RT retinal tear; SB scleral buckle; PPV pars plana vitrectomy; VH Vitreous hemorrhage; PRP panretinal laser photocoagulation; IVK intravitreal kenalog; VMT vitreomacular traction; MH Macular hole;  NVD neovascularization of the disc; NVE neovascularization elsewhere; AREDS age related eye disease study; ARMD age related macular degeneration; POAG primary open angle glaucoma; EBMD epithelial/anterior basement membrane dystrophy; ACIOL anterior chamber intraocular lens; IOL intraocular lens; PCIOL posterior chamber intraocular lens; Phaco/IOL phacoemulsification with intraocular lens placement; PRK photorefractive keratectomy; LASIK laser assisted in situ  keratomileusis; HTN hypertension; DM diabetes mellitus; COPD chronic obstructive pulmonary disease

## 2018-04-01 ENCOUNTER — Encounter (INDEPENDENT_AMBULATORY_CARE_PROVIDER_SITE_OTHER): Payer: Self-pay | Admitting: Ophthalmology

## 2018-04-01 ENCOUNTER — Ambulatory Visit (INDEPENDENT_AMBULATORY_CARE_PROVIDER_SITE_OTHER): Payer: Medicare Other | Admitting: Ophthalmology

## 2018-04-01 DIAGNOSIS — I10 Essential (primary) hypertension: Secondary | ICD-10-CM

## 2018-04-01 DIAGNOSIS — H35033 Hypertensive retinopathy, bilateral: Secondary | ICD-10-CM

## 2018-04-01 DIAGNOSIS — Z961 Presence of intraocular lens: Secondary | ICD-10-CM

## 2018-04-01 DIAGNOSIS — H3581 Retinal edema: Secondary | ICD-10-CM

## 2018-04-01 DIAGNOSIS — E113293 Type 2 diabetes mellitus with mild nonproliferative diabetic retinopathy without macular edema, bilateral: Secondary | ICD-10-CM

## 2018-04-01 DIAGNOSIS — H26493 Other secondary cataract, bilateral: Secondary | ICD-10-CM

## 2018-04-02 ENCOUNTER — Encounter (INDEPENDENT_AMBULATORY_CARE_PROVIDER_SITE_OTHER): Payer: Self-pay | Admitting: Ophthalmology

## 2018-04-28 NOTE — Progress Notes (Signed)
[facility] - Clinic Note  04/29/2018     CHIEF COMPLAINT Patient presents for Retina Follow Up   HISTORY OF PRESENT ILLNESS: [name] is a 42 y.o. female who presents to the clinic today for:   HPI    Retina Follow Up    Patient presents with  Diabetic Retinopathy.  In both eyes.  This started 1 month ago.  Severity is mild.  Since onset it is gradually improving.  I, the attending physician,  performed the HPI with the patient and updated documentation appropriately.          Comments    F/U S/P Yag OS (03/18/18) Patient states "My vision clearer" No pain x 2 weeks OS. BS is not  monitored per patient, reports BS are in 200's, she has ov with PCP in September per patient. Pt has completed PF as directed.       Last edited by Erika Chris, MD on 04/29/2018 12:17 PM. (History)    Pt states she noticed improvement in OS VA; Pt states she completed gtts as directed;   Referring physician: Fleet Contras, MD [id number] [place], [place] [address]  HISTORICAL INFORMATION:   Selected notes from the MEDICAL RECORD NUMBER Referred by Dr. Concepcion Duncan for DM exam; LEE- 05.25.19 ([name]) {BCVA OD: 20/40 OS: 20/60] Ocular Hx- pseudophakia OU, YAG Cap OU PMH- DM (on insulin and jardiance, last A1C - unknown), neuropathy, peripheral neuropathy, herpes, HTN, high cholesterol, arthritis    CURRENT MEDICATIONS: No current outpatient medications on file. (Ophthalmic Drugs)   No current facility-administered medications for this visit.  (Ophthalmic Drugs)   Current Outpatient Medications (Other)  Medication Sig  . aspirin EC 325 MG tablet Take 1 tablet (325 mg total) by mouth daily.  . cyclobenzaprine (FLEXERIL) 10 MG tablet Take 10 mg by mouth 3 (three) times daily as needed for muscle spasms.  . ferrous sulfate 325 (65 FE) MG tablet Take 325 mg by mouth daily with breakfast.  . fluconazole (DIFLUCAN) 200 MG tablet Take 1 tablet (200 mg total) by mouth daily.   Marland Kitchen gabapentin (NEURONTIN) 100 MG capsule Take 100 mg by mouth 3 (three) times daily.  Marland Kitchen ibuprofen (ADVIL,MOTRIN) 600 MG tablet Take 600 mg by mouth every 6 (six) hours as needed for mild pain.  Marland Kitchen insulin detemir (LEVEMIR) 100 UNIT/ML injection Inject 25 Units into the skin daily.  . Liraglutide 18 MG/3ML SOPN Inject 0.6 mLs into the skin at bedtime.  . metroNIDAZOLE (METROGEL VAGINAL) 0.75 % vaginal gel Place 1 Applicatorful vaginally 2 (two) times a week. For 4-6 months.  . Multiple Vitamin (MULTIVITAMIN WITH MINERALS) TABS tablet Take 1 tablet by mouth daily.  . Omega-3 Fatty Acids (OMEGA 3 PO) Take 1 capsule by mouth daily.  Marland Kitchen oxyCODONE-acetaminophen (ROXICET) 5-325 MG per tablet Take 1 tablet by mouth every 4 (four) hours as needed for severe pain.  . simvastatin (ZOCOR) 10 MG tablet Take 10 mg by mouth daily.  Marland Kitchen terconazole (TERAZOL 7) 0.4 % vaginal cream Place 1 applicator vaginally at bedtime.  . valACYclovir (VALTREX) 500 MG tablet Take 1 tablet (500 mg total) by mouth 2 (two) times daily. For 2 weeks.  . vitamin B-12 (CYANOCOBALAMIN) 100 MCG tablet Take 100 mcg by mouth daily.  Marland Kitchen zolpidem (AMBIEN) 10 MG tablet Take 10 mg by mouth at bedtime as needed for sleep.   No current facility-administered medications for this visit.  (Other)      REVIEW OF SYSTEMS: ROS  Positive for: Endocrine, Eyes   Negative for: Constitutional, Gastrointestinal, Neurological, Skin, Genitourinary, Musculoskeletal, HENT, Cardiovascular, Respiratory, Psychiatric, Allergic/Imm, Heme/Lymph   Last edited by [name], Glenda, LPN on 8/11/91478/14/2019 10:42 AM. (History)       ALLERGIES Allergies  Allergen Reactions  . Phenergan [Promethazine Hcl]     Lost vision  . Tramadol Other (See Comments)    "put her heart into shock"    PAST MEDICAL HISTORY Past Medical History:  Diagnosis Date  . Abscess 11/2012   RIGHT FINGER  . Arthritis   . Cataracts, bilateral   . Chronic back pain   . Diabetes mellitus     type 1  . Diabetic neuropathy (HCC)   . Herpes virus disease   . Hip fracture (HCC)    left side  . History of blood transfusion 2013   left  hip- pinning  . Shortness of breath dyspnea    with exertion  . Urinary tract infection    hx of   Past Surgical History:  Procedure Laterality Date  . CATARACT EXTRACTION     left  . CATARACT EXTRACTION W/PHACO  06/12/2012   Procedure: CATARACT EXTRACTION PHACO AND INTRAOCULAR LENS PLACEMENT (IOC);  Surgeon: Erika FloodGreer Geiger, MD;  [facility];  Service: Ophthalmology;  Laterality: Right;  . CATARACT EXTRACTION W/PHACO  06/15/2012   Procedure: CATARACT EXTRACTION PHACO AND INTRAOCULAR LENS PLACEMENT (IOC);  Surgeon: Erika FloodGreer Geiger, MD;  [facility];  Service: Ophthalmology;  Laterality: Left;  . CESAREAN SECTION     x 3  . CESAREAN SECTION WITH BILATERAL TUBAL LIGATION Bilateral 09/10/2013   Procedure: REPEAT CESAREAN SECTION WITH BILATERAL TUBAL LIGATION;  Surgeon: Erika NailsAngela Y Roberts, MD;  Location: [facility] ORS;  Service: Obstetrics;  Laterality: Bilateral;  . EYE SURGERY Bilateral   . FRACTURE SURGERY     surgery left hip  . I&D EXTREMITY Right 11/23/2012   Procedure: IRRIGATION AND DEBRIDEMENT EXTREMITY;  Surgeon: Erika Marbleavid A Thompson, MD;  [facility];  Service: Orthopedics;  Laterality: Right;  . I&D EXTREMITY Right 11/30/2012   Procedure: IRRIGATION AND DEBRIDEMENT Right hand Abscess;  Surgeon: Erika Marbleavid A Thompson, MD;  [facility];  Service: Orthopedics;  Laterality: Right;  . TOTAL HIP ARTHROPLASTY Left 10/12/2014   Procedure: TOTAL HIP ARTHROPLASTY;  Surgeon: Erika MustardMarcus Duda V, MD;  Location: [facility] OR;  Service: Orthopedics;  Laterality: Left;  . TUBAL LIGATION      FAMILY HISTORY Family History  Problem Relation Age of Onset  . Diabetes Mother   . Heart disease Mother     SOCIAL HISTORY Social History   Tobacco Use  . Smoking status: Never Smoker  . Smokeless tobacco: Never Used  Substance Use Topics  . Alcohol use: No     Alcohol/week: 0.0 standard drinks  . Drug use: No         OPHTHALMIC EXAM:  Base Eye Exam    Visual Acuity (Snellen - Linear)      Right Left   Dist Elim 20/30 20/40   Dist ph Taylor NI 20/25       Tonometry (Tonopen, 10:55 AM)      Right Left   Pressure 15 18       Pupils      Dark Light Shape React APD   Right 4 3 Round Brisk None   Left 4 3 Round Brisk None       Visual Fields (Counting fingers)      Left Right    Full Full  Extraocular Movement      Right Left    Full, Ortho Full, Ortho       Neuro/Psych    Oriented x3:  Yes   Mood/Affect:  Normal       Dilation    Both eyes:  1.0% Mydriacyl, 2.5% Phenylephrine @ 10:56 AM        Slit Lamp and Fundus Exam    Slit Lamp Exam      Right Left   Lids/Lashes Normal Meibomian gland dysfunction   Conjunctiva/Sclera Mild Inferior Melanosis Mild Melanosis   Cornea Mild Arcus Mild Arcus   Anterior Chamber Deep and quiet Deep and quiet   Iris Round and dilated, No NVI Round and dilated, No NVI   Lens Posterior chamber intraocular lens, anterior capsular phimosis, mild vitreous prolapse at 0300, 2+ PCO with small PC opening PC IOL in good position, widely open PC   Vitreous Vitreous syneresis Vitreous syneresis       Fundus Exam      Right Left   Disc Pink and Sharp Pink and Sharp   C/D Ratio 0.2 0.1   Macula Flat, good foveal reflex, No heme or edema Flat, Good foveal reflex, No heme or edema   Vessels Mildly Tortuous, mild AV crossing changes Mildly Tortuous, mild AV crossing changes   Periphery Attached, few scattered IRH and MAs Attached          IMAGING AND PROCEDURES  Imaging and Procedures for @TODAY @  OCT, Retina - OU - Both Eyes       Right Eye Quality was good. Central Foveal Thickness: 270. Progression has been stable. Findings include normal foveal contour, no IRF, no SRF (Trace scattered cystic changes -- slightly improved).   Left Eye Quality was good. Central Foveal Thickness: 277.  Progression has been stable. Findings include normal foveal contour, no IRF, no SRF, epiretinal membrane.   Notes *Images captured and stored on drive  Diagnosis / Impression:  No CSME OU trace cystic changes non central OD -- slight interval improvement  Clinical management:  See below  Abbreviations: NFP - Normal foveal profile. CME - cystoid macular edema. PED - pigment epithelial detachment. IRF - intraretinal fluid. SRF - subretinal fluid. EZ - ellipsoid zone. ERM - epiretinal membrane. ORA - outer retinal atrophy. ORT - outer retinal tubulation. SRHM - subretinal hyper-reflective material         Yag Capsulotomy - OD - Right Eye       Procedure note: YAG Capsulotomy, RIGHT Eye  Informed consent obtained. Pre-op dilating drops (1% Topicamide and 2.5% Phenylephrine), and topical anesthesia given. Power: 5.6 mJ Shots: 26 Posterior capsulotomy in cruciate formation performed without difficulty. Patient tolerated procedure well. No complications. Rx pred forte 4 times a day for 7 days, then stop. Pt received written and verbal post laser education. Recheck in 2 weeks w/ dilated exam.                 ASSESSMENT/PLAN:    ICD-10-CM   1. Mild nonproliferative diabetic retinopathy of both eyes without macular edema associated with type 2 diabetes mellitus (HCC) E11.3293 OCT, Retina - OU - Both Eyes  2. Retinal edema H35.81 OCT, Retina - OU - Both Eyes  3. Essential hypertension I10   4. Hypertensive retinopathy of both eyes H35.033   5. Pseudophakia of both eyes Z96.1   6. 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 Mild Non-proliferative diabetic retinopathy  w/o DME, OU - The incidence, risk factors for progression, natural history and treatment options for diabetic retinopathy were discussed with patient.   - The need for close monitoring of blood glucose, blood pressure, and serum lipids, avoiding  cigarette or any type of tobacco, and the need for long term follow up was also discussed with patient. - exam with few peripheral IRH/MAs - OCT without clinically significant diabetic macular edema OU -- mild noncentral cystic changes OD -- stable - F/U 4 weeks, DFE OU, OCT  3,4. Hypertensive retinopathy OU - discussed importance of tight BP control - monitor  5. Pseudophakia OU  - s/p CE/IOL OU w/ Dr. Clarisa KindredGeiger -- stable  - anterior capsular phimosis OU (OD > OS)  - mild vitreous prolapse OD -- appears stable  - monitor  6. PCO OU-  - OS>OD - s/p YAG cap OU with [name] -- small openings - S/P YAG cap touch up OS (07.03.19) -- improved PC opening - subjective improvement in vision OS - recommend YAG touch up OD today - pt wishes to proceed - RBA of procedure discussed, questions answered - informed consent obtained and signed - see procedure note - start PF QID OD x7 days - F/U 2 weeks, dilate OU   Ophthalmic Meds Ordered this visit:  No orders of the defined types were placed in this encounter.      Return in about 2 weeks (around 05/13/2018) for F/U Yag Cap OU, DFE, OCT.  There are no Patient Instructions on file for this visit.   Explained the diagnoses, plan, and follow up with the patient and they expressed understanding.  Patient expressed understanding of the importance of proper follow up care.   This document serves as a record of services personally performed by Karie ChimeraBrian G. Jimmye Wisnieski, MD, PhD. It was created on their behalf by Laurian BrimAmanda Brown, OA, an ophthalmic assistant. The creation of this record is the provider's dictation and/or activities during the visit.    Electronically signed by: Laurian BrimAmanda Brown, OA  08.13.2019 1:06 PM     Karie ChimeraBrian G. Garmon Dehn, M.D., Ph.D. Diseases & Surgery of the Retina and Vitreous [facility]  I have reviewed the above documentation for accuracy and completeness, and I agree with the above. Karie ChimeraBrian G. Jessabelle Markiewicz, M.D., Ph.D.  04/29/18 1:07 PM    Abbreviations: M myopia (nearsighted); A astigmatism; H hyperopia (farsighted); P presbyopia; Mrx spectacle prescription;  CTL contact lenses; OD right eye; OS left eye; OU both eyes  XT exotropia; ET esotropia; PEK punctate epithelial keratitis; PEE punctate epithelial erosions; DES dry eye syndrome; MGD meibomian gland dysfunction; ATs artificial tears; PFAT's preservative free artificial tears; NSC nuclear sclerotic cataract; PSC posterior subcapsular cataract; ERM epi-retinal membrane; PVD posterior vitreous detachment; RD retinal detachment; DM diabetes mellitus; DR diabetic retinopathy; NPDR non-proliferative diabetic retinopathy; PDR proliferative diabetic retinopathy; CSME clinically significant macular edema; DME diabetic macular edema; dbh dot blot hemorrhages; CWS cotton wool spot; POAG primary open angle glaucoma; C/D cup-to-disc ratio; HVF humphrey visual field; GVF goldmann visual field; OCT optical coherence tomography; IOP intraocular pressure; BRVO Branch retinal vein occlusion; CRVO central retinal vein occlusion; CRAO central retinal artery occlusion; BRAO branch retinal artery occlusion; RT retinal tear; SB scleral buckle; PPV pars plana vitrectomy; VH Vitreous hemorrhage; PRP panretinal laser photocoagulation; IVK intravitreal kenalog; VMT vitreomacular traction; MH Macular hole;  NVD neovascularization of the disc; NVE neovascularization elsewhere; AREDS age related eye disease study; ARMD age related macular degeneration; POAG primary open angle  glaucoma; EBMD epithelial/anterior basement membrane dystrophy; ACIOL anterior chamber intraocular lens; IOL intraocular lens; PCIOL posterior chamber intraocular lens; Phaco/IOL phacoemulsification with intraocular lens placement; [place] photorefractive keratectomy; LASIK laser assisted in situ keratomileusis; HTN hypertension; DM diabetes mellitus; COPD chronic obstructive pulmonary disease

## 2018-04-29 ENCOUNTER — Encounter (INDEPENDENT_AMBULATORY_CARE_PROVIDER_SITE_OTHER): Payer: Self-pay | Admitting: Ophthalmology

## 2018-04-29 ENCOUNTER — Ambulatory Visit (INDEPENDENT_AMBULATORY_CARE_PROVIDER_SITE_OTHER): Payer: Medicare Other | Admitting: Ophthalmology

## 2018-04-29 DIAGNOSIS — I10 Essential (primary) hypertension: Secondary | ICD-10-CM

## 2018-04-29 DIAGNOSIS — H35033 Hypertensive retinopathy, bilateral: Secondary | ICD-10-CM

## 2018-04-29 DIAGNOSIS — E113293 Type 2 diabetes mellitus with mild nonproliferative diabetic retinopathy without macular edema, bilateral: Secondary | ICD-10-CM

## 2018-04-29 DIAGNOSIS — H26493 Other secondary cataract, bilateral: Secondary | ICD-10-CM | POA: Diagnosis not present

## 2018-04-29 DIAGNOSIS — Z961 Presence of intraocular lens: Secondary | ICD-10-CM

## 2018-04-29 DIAGNOSIS — H3581 Retinal edema: Secondary | ICD-10-CM | POA: Diagnosis not present

## 2018-05-14 NOTE — Progress Notes (Signed)
[facility] - Clinic Note  05/15/2018     CHIEF COMPLAINT Patient presents for Post-op Follow-up   HISTORY OF PRESENT ILLNESS: [name] is a 42 y.o. female who presents to the clinic today for:   HPI    Post-op Follow-up    In both eyes.  Discomfort includes none.  Negative for pain, foreign body sensation, discharge, floaters, tearing and itching.  Vision is improved.  I, the attending physician,  performed the HPI with the patient and updated documentation appropriately.          Comments    42 y/o female pt here for 2 wk f/u s/p yag caps OU.  VA about the same OU.  Denies pain, flashes, floaters.  Has been using a gtt TID OU, but does not recall the name.  Has not checked BS today.       Last edited by Concepcion Elk, COA on 05/15/2018  2:13 PM. (History)      Referring physician: Fleet Contras, MD [address] [place], [place] [address]  HISTORICAL INFORMATION:   Selected notes from the MEDICAL RECORD NUMBER Referred by Dr. Concepcion Elk for DM exam; LEE- 05.25.19 ([name]) {BCVA OD: 20/40 OS: 20/60] Ocular Hx- pseudophakia OU, YAG Cap OU PMH- DM (on insulin and jardiance, last A1C - unknown), neuropathy, peripheral neuropathy, herpes, HTN, high cholesterol, arthritis    CURRENT MEDICATIONS: No current outpatient medications on file. (Ophthalmic Drugs)   No current facility-administered medications for this visit.  (Ophthalmic Drugs)   Current Outpatient Medications (Other)  Medication Sig  . aspirin EC 325 MG tablet Take 1 tablet (325 mg total) by mouth daily.  . cyclobenzaprine (FLEXERIL) 10 MG tablet Take 10 mg by mouth 3 (three) times daily as needed for muscle spasms.  . ferrous sulfate 325 (65 FE) MG tablet Take 325 mg by mouth daily with breakfast.  . fluconazole (DIFLUCAN) 200 MG tablet Take 1 tablet (200 mg total) by mouth daily.  Marland Kitchen gabapentin (NEURONTIN) 100 MG capsule Take 100 mg by mouth 3 (three) times daily.  Marland Kitchen ibuprofen  (ADVIL,MOTRIN) 600 MG tablet Take 600 mg by mouth every 6 (six) hours as needed for mild pain.  Marland Kitchen insulin detemir (LEVEMIR) 100 UNIT/ML injection Inject 25 Units into the skin daily.  . Liraglutide 18 MG/3ML SOPN Inject 0.6 mLs into the skin at bedtime.  . metroNIDAZOLE (METROGEL VAGINAL) 0.75 % vaginal gel Place 1 Applicatorful vaginally 2 (two) times a week. For 4-6 months.  . Multiple Vitamin (MULTIVITAMIN WITH MINERALS) TABS tablet Take 1 tablet by mouth daily.  . Omega-3 Fatty Acids (OMEGA 3 PO) Take 1 capsule by mouth daily.  Marland Kitchen oxyCODONE-acetaminophen (ROXICET) 5-325 MG per tablet Take 1 tablet by mouth every 4 (four) hours as needed for severe pain.  . simvastatin (ZOCOR) 10 MG tablet Take 10 mg by mouth daily.  Marland Kitchen terconazole (TERAZOL 7) 0.4 % vaginal cream Place 1 applicator vaginally at bedtime.  . valACYclovir (VALTREX) 500 MG tablet Take 1 tablet (500 mg total) by mouth 2 (two) times daily. For 2 weeks.  . vitamin B-12 (CYANOCOBALAMIN) 100 MCG tablet Take 100 mcg by mouth daily.  Marland Kitchen zolpidem (AMBIEN) 10 MG tablet Take 10 mg by mouth at bedtime as needed for sleep.   No current facility-administered medications for this visit.  (Other)      REVIEW OF SYSTEMS: ROS    Positive for: Endocrine, Eyes   Negative for: Constitutional, Gastrointestinal, Neurological, Skin, Genitourinary, Musculoskeletal, HENT, Cardiovascular,  Respiratory, Psychiatric, Allergic/Imm, Heme/Lymph   Last edited by Celine Mans, COA on 05/15/2018  1:27 PM. (History)       ALLERGIES Allergies  Allergen Reactions  . Phenergan [Promethazine Hcl]     Lost vision  . Tramadol Other (See Comments)    "put her heart into shock"    PAST MEDICAL HISTORY Past Medical History:  Diagnosis Date  . Abscess 11/2012   RIGHT FINGER  . Arthritis   . Cataracts, bilateral   . Chronic back pain   . Diabetes mellitus    type 1  . Diabetic neuropathy (HCC)   . Herpes virus disease   . Hip fracture (HCC)    left  side  . History of blood transfusion 2013   left  hip- pinning  . Shortness of breath dyspnea    with exertion  . Urinary tract infection    hx of   Past Surgical History:  Procedure Laterality Date  . CATARACT EXTRACTION     left  . CATARACT EXTRACTION W/PHACO  06/12/2012   Procedure: CATARACT EXTRACTION PHACO AND INTRAOCULAR LENS PLACEMENT (IOC);  Surgeon: [name], MD;  [facility] OR;  Service: Ophthalmology;  Laterality: Right;  . CATARACT EXTRACTION W/PHACO  06/15/2012   Procedure: CATARACT EXTRACTION PHACO AND INTRAOCULAR LENS PLACEMENT (IOC);  Surgeon: [name], MD;  Location: [facility] OR;  Service: Ophthalmology;  Laterality: Left;  . CESAREAN SECTION     x 3  . CESAREAN SECTION WITH BILATERAL TUBAL LIGATION Bilateral 09/10/2013   Procedure: REPEAT CESAREAN SECTION WITH BILATERAL TUBAL LIGATION;  Surgeon: Purcell Nails, MD;  Location: [facility] ORS;  Service: Obstetrics;  Laterality: Bilateral;  . EYE SURGERY Bilateral   . FRACTURE SURGERY     surgery left hip  . I&D EXTREMITY Right 11/23/2012   Procedure: IRRIGATION AND DEBRIDEMENT EXTREMITY;  Surgeon: Jodi Marble, MD;  Location: [facility] OR;  Service: Orthopedics;  Laterality: Right;  . I&D EXTREMITY Right 11/30/2012   Procedure: IRRIGATION AND DEBRIDEMENT Right hand Abscess;  Surgeon: Jodi Marble, MD;  Location: [facility] OR;  Service: Orthopedics;  Laterality: Right;  . TOTAL HIP ARTHROPLASTY Left 10/12/2014   Procedure: TOTAL HIP ARTHROPLASTY;  Surgeon: Nadara Mustard, MD;  Location: [facility] OR;  Service: Orthopedics;  Laterality: Left;  . TUBAL LIGATION      FAMILY HISTORY Family History  Problem Relation Age of Onset  . Diabetes Mother   . Heart disease Mother     SOCIAL HISTORY Social History   Tobacco Use  . Smoking status: Never Smoker  . Smokeless tobacco: Never Used  Substance Use Topics  . Alcohol use: No    Alcohol/week: 0.0 standard drinks  . Drug use: No         OPHTHALMIC EXAM:  Base Eye Exam     Visual Acuity (Snellen - Linear)      Right Left   Dist [place] 20/30 20/40   Dist ph [place] 20/25 20/25       Tonometry (Tonopen, 1:25 PM)      Right Left   Pressure 16 16       Pupils      Dark Light Shape React APD   Right 4 3 Round Brisk None   Left 4 3 Round Brisk None       Visual Fields (Counting fingers)      Left Right    Full Full       Extraocular Movement      Right  Left    Full, Ortho Full, Ortho       Neuro/Psych    Oriented x3:  Yes   Mood/Affect:  Normal       Dilation    Both eyes:  1.0% Mydriacyl, 2.5% Phenylephrine @ 1:25 PM        Slit Lamp and Fundus Exam    Slit Lamp Exam      Right Left   Lids/Lashes Normal Meibomian gland dysfunction   Conjunctiva/Sclera Mild Inferior Melanosis Mild Melanosis   Cornea Mild Arcus Mild Arcus   Anterior Chamber Deep and quiet Deep and quiet   Iris Round and dilated, No NVI Round and dilated, No NVI   Lens Posterior chamber intraocular lens, anterior capsular phimosis, mild vitreous prolapse at 0300, improved PC opening PC IOL in good position, widely open PC   Vitreous Vitreous syneresis Vitreous syneresis       Fundus Exam      Right Left   Disc Pink and Sharp Pink and Sharp   C/D Ratio 0.2 0.1   Macula Flat, good foveal reflex, No heme or edema Flat, Good foveal reflex, No heme or edema   Vessels Mildly Tortuous, mild AV crossing changes, Vascular attenuation Mildly Tortuous, mild AV crossing changes   Periphery Attached, few scattered IRH and MAs Attached          IMAGING AND PROCEDURES  Imaging and Procedures for @TODAY @  OCT, Retina - OU - Both Eyes       Right Eye Quality was good. Central Foveal Thickness: 268. Progression has been stable. Findings include normal foveal contour, no IRF, no SRF (Trace cystic changes ST macula).   Left Eye Quality was good. Central Foveal Thickness: 275. Progression has been stable. Findings include normal foveal contour, no IRF, no SRF, epiretinal membrane.    Notes *Images captured and stored on drive  Diagnosis / Impression:  No CSME OU trace cystic changes non central OD -- stable  Clinical management:  See below  Abbreviations: NFP - Normal foveal profile. CME - cystoid macular edema. PED - pigment epithelial detachment. IRF - intraretinal fluid. SRF - subretinal fluid. EZ - ellipsoid zone. ERM - epiretinal membrane. ORA - outer retinal atrophy. ORT - outer retinal tubulation. SRHM - subretinal hyper-reflective material                  ASSESSMENT/PLAN:    ICD-10-CM   1. Mild nonproliferative diabetic retinopathy of both eyes without macular edema associated with type 2 diabetes mellitus (HCC) E11.3293 OCT, Retina - OU - Both Eyes  2. Retinal edema H35.81 OCT, Retina - OU - Both Eyes  3. Essential hypertension I10   4. Hypertensive retinopathy of both eyes H35.033   5. Pseudophakia of both eyes Z96.1   6. PCO (posterior capsular opacification), bilateral H26.493     1,2. Mild Non-proliferative diabetic retinopathy w/o DME, OU - The incidence, risk factors for progression, natural history and treatment options for diabetic retinopathy were discussed with patient.   - The need for close monitoring of blood glucose, blood pressure, and serum lipids, avoiding cigarette or any type of tobacco, and the need for long term follow up was also discussed with patient. - exam with few peripheral IRH/MAs - OCT without clinically significant diabetic macular edema OU -- mild noncentral cystic changes OD -- stable - F/U 3-4 months, DFE OU, OCT  3,4. Hypertensive retinopathy OU - discussed importance of tight BP control - monitor  5. Pseudophakia OU  -  s/p CE/IOL OU w/ Dr. Clarisa Kindred -- stable  - anterior capsular phimosis OU (OD > OS)  - mild vitreous prolapse OD -- appears stable  - monitor  6. PCO OU-  - OS>OD - s/p YAG cap OU with [name] -- small openings - S/P YAG cap touch up OS (07.03.19) -- improved PC opening - S/P YAG  cap touch up OD (08.14.19) -- improved PC opening - finished PF QID OD x7 days   Ophthalmic Meds Ordered this visit:  No orders of the defined types were placed in this encounter.      Return in about 3 months (around 08/15/2018) for F/U NPDR OU, DFE, OCT.  There are no Patient Instructions on file for this visit.   Explained the diagnoses, plan, and follow up with the patient and they expressed understanding.  Patient expressed understanding of the importance of proper follow up care.   This document serves as a record of services personally performed by Karie Chimera, MD, PhD. It was created on their behalf by Laurian Brim, OA, an ophthalmic assistant. The creation of this record is the provider's dictation and/or activities during the visit.    Electronically signed by: Laurian Brim, OA  08.29.2019 11:46 PM   This document serves as a record of services personally performed by Karie Chimera, MD, PhD. It was created on their behalf by Virgilio Belling, COA, a certified ophthalmic assistant. The creation of this record is the provider's dictation and/or activities during the visit.  Electronically signed by: Virgilio Belling, COA  08.30.19 11:46 PM    Karie Chimera, M.D., Ph.D. Diseases & Surgery of the Retina and Vitreous [facility]  I have reviewed the above documentation for accuracy and completeness, and I agree with the above. Karie Chimera, M.D., Ph.D. 05/17/18 11:47 PM     Abbreviations: M myopia (nearsighted); A astigmatism; H hyperopia (farsighted); P presbyopia; Mrx spectacle prescription;  CTL contact lenses; OD right eye; OS left eye; OU both eyes  XT exotropia; ET esotropia; PEK punctate epithelial keratitis; PEE punctate epithelial erosions; DES dry eye syndrome; MGD meibomian gland dysfunction; ATs artificial tears; PFAT's preservative free artificial tears; NSC nuclear sclerotic cataract; PSC posterior subcapsular cataract; ERM  epi-retinal membrane; PVD posterior vitreous detachment; RD retinal detachment; DM diabetes mellitus; DR diabetic retinopathy; NPDR non-proliferative diabetic retinopathy; PDR proliferative diabetic retinopathy; CSME clinically significant macular edema; DME diabetic macular edema; dbh dot blot hemorrhages; CWS cotton wool spot; POAG primary open angle glaucoma; C/D cup-to-disc ratio; HVF humphrey visual field; GVF goldmann visual field; OCT optical coherence tomography; IOP intraocular pressure; BRVO Branch retinal vein occlusion; CRVO central retinal vein occlusion; CRAO central retinal artery occlusion; BRAO branch retinal artery occlusion; RT retinal tear; SB scleral buckle; PPV pars plana vitrectomy; VH Vitreous hemorrhage; PRP panretinal laser photocoagulation; IVK intravitreal kenalog; VMT vitreomacular traction; MH Macular hole;  NVD neovascularization of the disc; NVE neovascularization elsewhere; AREDS age related eye disease study; ARMD age related macular degeneration; POAG primary open angle glaucoma; EBMD epithelial/anterior basement membrane dystrophy; ACIOL anterior chamber intraocular lens; IOL intraocular lens; PCIOL posterior chamber intraocular lens; Phaco/IOL phacoemulsification with intraocular lens placement; PRK photorefractive keratectomy; LASIK laser assisted in situ keratomileusis; HTN hypertension; DM diabetes mellitus; COPD chronic obstructive pulmonary disease

## 2018-05-15 ENCOUNTER — Encounter (INDEPENDENT_AMBULATORY_CARE_PROVIDER_SITE_OTHER): Payer: Self-pay | Admitting: Ophthalmology

## 2018-05-15 ENCOUNTER — Ambulatory Visit (INDEPENDENT_AMBULATORY_CARE_PROVIDER_SITE_OTHER): Payer: Medicare Other | Admitting: Ophthalmology

## 2018-05-15 DIAGNOSIS — H35033 Hypertensive retinopathy, bilateral: Secondary | ICD-10-CM

## 2018-05-15 DIAGNOSIS — H3581 Retinal edema: Secondary | ICD-10-CM | POA: Diagnosis not present

## 2018-05-15 DIAGNOSIS — E113293 Type 2 diabetes mellitus with mild nonproliferative diabetic retinopathy without macular edema, bilateral: Secondary | ICD-10-CM | POA: Diagnosis not present

## 2018-05-15 DIAGNOSIS — Z961 Presence of intraocular lens: Secondary | ICD-10-CM

## 2018-05-15 DIAGNOSIS — I10 Essential (primary) hypertension: Secondary | ICD-10-CM

## 2018-05-15 DIAGNOSIS — H26493 Other secondary cataract, bilateral: Secondary | ICD-10-CM

## 2018-05-17 ENCOUNTER — Encounter (INDEPENDENT_AMBULATORY_CARE_PROVIDER_SITE_OTHER): Payer: Self-pay | Admitting: Ophthalmology

## 2018-07-22 NOTE — Progress Notes (Deleted)
Office Visit Note  Patient: [name]             Date of Birth: [birth year]           MRN: [id number]             PCP: Fleet Contras, MD Referring: Fleet Contras, MD Visit Date: 08/05/2018 Occupation: @GUAROCC @  Subjective:  No chief complaint on file.   History of Present Illness: [name] is a 42 y.o. female ***   Activities of Daily Living:  Patient reports morning stiffness for *** {minute/hour:19697}.   Patient {ACTIONS;DENIES/REPORTS:21021675::"Denies"} nocturnal pain.  Difficulty dressing/grooming: {ACTIONS;DENIES/REPORTS:21021675::"Denies"} Difficulty climbing stairs: {ACTIONS;DENIES/REPORTS:21021675::"Denies"} Difficulty getting out of chair: {ACTIONS;DENIES/REPORTS:21021675::"Denies"} Difficulty using hands for taps, buttons, cutlery, and/or writing: {ACTIONS;DENIES/REPORTS:21021675::"Denies"}  No Rheumatology ROS completed.   PMFS History:  Patient Active Problem List   Diagnosis Date Noted  . 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 S/P repeat low transverse C-section 09/10/2013  . Cellulitis and abscess 12/22/2012  . Diabetes (HCC) 11/25/2012    Past Medical History:  Diagnosis Date  . Abscess 11/2012   RIGHT FINGER  . Arthritis   . Cataracts, bilateral   . Chronic back pain   . Diabetes mellitus    type 1  . Diabetic neuropathy (HCC)   . Herpes virus disease   . Hip fracture (HCC)    left side  . History of blood transfusion 2013   left  hip- pinning  . Shortness of breath dyspnea    with exertion  . Urinary tract infection    hx of    Family History  Problem Relation Age of Onset  . Diabetes Mother   . Heart disease Mother    Past Surgical History:  Procedure Laterality Date  . CATARACT EXTRACTION     left  . CATARACT EXTRACTION W/PHACO  06/12/2012   Procedure: CATARACT EXTRACTION PHACO AND INTRAOCULAR LENS PLACEMENT (IOC);  Surgeon: [name], MD;  Location: [facility] OR;  Service: Ophthalmology;  Laterality: Right;  .  CATARACT EXTRACTION W/PHACO  06/15/2012   Procedure: CATARACT EXTRACTION PHACO AND INTRAOCULAR LENS PLACEMENT (IOC);  Surgeon: [name], MD;  Location: [facility] OR;  Service: Ophthalmology;  Laterality: Left;  . CESAREAN SECTION     x 3  . CESAREAN SECTION WITH BILATERAL TUBAL LIGATION Bilateral 09/10/2013   Procedure: REPEAT CESAREAN SECTION WITH BILATERAL TUBAL LIGATION;  Surgeon: Purcell Nails, MD;  Location: [facility] ORS;  Service: Obstetrics;  Laterality: Bilateral;  . EYE SURGERY Bilateral   . FRACTURE SURGERY     surgery left hip  . I&D EXTREMITY Right 11/23/2012   Procedure: IRRIGATION AND DEBRIDEMENT EXTREMITY;  Surgeon: Jodi Marble, MD;  Location: [facility] OR;  Service: Orthopedics;  Laterality: Right;  . I&D EXTREMITY Right 11/30/2012   Procedure: IRRIGATION AND DEBRIDEMENT Right hand Abscess;  Surgeon: Jodi Marble, MD;  Location: [facility] OR;  Service: Orthopedics;  Laterality: Right;  . TOTAL HIP ARTHROPLASTY Left 10/12/2014   Procedure: TOTAL HIP ARTHROPLASTY;  Surgeon: Nadara Mustard, MD;  Location: [facility] OR;  Service: Orthopedics;  Laterality: Left;  . TUBAL LIGATION     Social History   Social History Narrative  . Not on file    Objective: Vital Signs: There were no vitals taken for this visit.   Physical Exam   Musculoskeletal Exam: ***  CDAI Exam: CDAI Score: Not documented Patient Global Assessment: Not documented; Provider Global Assessment: Not documented Swollen: Not documented; Tender: Not documented Joint Exam   Not documented  There is currently no information documented on the homunculus. Go to the Rheumatology activity and complete the homunculus joint exam.  Investigation: No additional findings.  Imaging: No results found.  Recent Labs: Lab Results  Component Value Date   WBC 9.4 10/15/2014   HGB 8.4 (L) 10/15/2014   PLT 247 10/15/2014   NA 132 (L) 10/15/2014   K 4.2 10/15/2014   CL 98 10/15/2014   CO2 29 10/15/2014   GLUCOSE 220 (H) 10/15/2014     BUN 8 10/15/2014   CREATININE 0.83 10/15/2014   BILITOT 0.4 10/12/2014   ALKPHOS 57 10/12/2014   AST 24 10/12/2014   ALT 12 10/12/2014   PROT 7.6 10/12/2014   ALBUMIN 3.6 10/12/2014   CALCIUM 8.1 (L) 10/15/2014   GFRAA >90 10/15/2014    Speciality Comments: No specialty comments available.  Procedures:  No procedures performed Allergies: Phenergan [promethazine hcl] and Tramadol   Assessment / Plan:     Visit Diagnoses: Chronic left shoulder pain  Primary osteoarthritis of right hip  History of carpal tunnel syndrome - 2010, unspecified laterality  Status post total replacement of left hip - Dr. Lajoyce Corners, 2016  History of diabetes mellitus, type II  History of diabetic retinopathy  Other insomnia   Orders: No orders of the defined types were placed in this encounter.  No orders of the defined types were placed in this encounter.   Face-to-face time spent with patient was *** minutes. Greater than 50% of time was spent in counseling and coordination of care.  Follow-Up Instructions: No follow-ups on file.   Gearldine Bienenstock, PA-C  Note - This record has been created using Dragon software.  Chart creation errors have been sought, but may not always  have been located. Such creation errors do not reflect on  the standard of medical care.

## 2018-07-24 ENCOUNTER — Encounter (HOSPITAL_COMMUNITY): Payer: Self-pay

## 2018-07-24 ENCOUNTER — Emergency Department (HOSPITAL_COMMUNITY)
Admission: EM | Admit: 2018-07-24 | Discharge: 2018-07-24 | Disposition: A | Discharge status: Home / Self Care | Payer: Medicare Other | Attending: Emergency Medicine | Admitting: Emergency Medicine

## 2018-07-24 ENCOUNTER — Other Ambulatory Visit: Payer: Self-pay

## 2018-07-24 ENCOUNTER — Emergency Department (HOSPITAL_COMMUNITY): Payer: Medicare Other

## 2018-07-24 DIAGNOSIS — R0789 Other chest pain: Secondary | ICD-10-CM | POA: Insufficient documentation

## 2018-07-24 DIAGNOSIS — E109 Type 1 diabetes mellitus without complications: Secondary | ICD-10-CM | POA: Insufficient documentation

## 2018-07-24 DIAGNOSIS — R112 Nausea with vomiting, unspecified: Secondary | ICD-10-CM | POA: Insufficient documentation

## 2018-07-24 DIAGNOSIS — Z794 Long term (current) use of insulin: Secondary | ICD-10-CM | POA: Insufficient documentation

## 2018-07-24 DIAGNOSIS — Z79899 Other long term (current) drug therapy: Secondary | ICD-10-CM | POA: Insufficient documentation

## 2018-07-24 DIAGNOSIS — Z96642 Presence of left artificial hip joint: Secondary | ICD-10-CM | POA: Diagnosis not present

## 2018-07-24 DIAGNOSIS — R079 Chest pain, unspecified: Secondary | ICD-10-CM | POA: Diagnosis present

## 2018-07-24 LAB — I-STAT BETA HCG BLOOD, ED (MC, WL, AP ONLY)

## 2018-07-24 LAB — BASIC METABOLIC PANEL WITH GFR
Anion gap: 10 (ref 5–15)
BUN: 14 mg/dL (ref 6–20)
CO2: 24 mmol/L (ref 22–32)
Calcium: 9.7 mg/dL (ref 8.9–10.3)
Chloride: 98 mmol/L (ref 98–111)
Creatinine, Ser: 0.82 mg/dL (ref 0.44–1.00)
GFR calc Af Amer: >60 mL/min
GFR calc non Af Amer: >60 mL/min
Glucose, Bld: 257 mg/dL — ABNORMAL HIGH (ref 70–99)
Potassium: 4.2 mmol/L (ref 3.5–5.1)
Sodium: 132 mmol/L — ABNORMAL LOW (ref 135–145)

## 2018-07-24 LAB — I-STAT TROPONIN, ED
Troponin i, poc: 0.01 ng/mL (ref 0.00–0.08)
Troponin i, poc: 0 ng/mL (ref 0.00–0.08)

## 2018-07-24 LAB — CBC
HEMATOCRIT: 42.8 % (ref 36.0–46.0)
Hemoglobin: 13.7 g/dL (ref 12.0–15.0)
MCH: 26.4 pg (ref 26.0–34.0)
MCHC: 32 g/dL (ref 30.0–36.0)
MCV: 82.5 fL (ref 80.0–100.0)
Platelets: 348 10*3/uL (ref 150–400)
RBC: 5.19 MIL/uL — ABNORMAL HIGH (ref 3.87–5.11)
RDW: 12.9 % (ref 11.5–15.5)
WBC: 13.4 10*3/uL — ABNORMAL HIGH (ref 4.0–10.5)
nRBC: 0 % (ref 0.0–0.2)

## 2018-07-24 MED ORDER — SODIUM CHLORIDE 0.9 % IV BOLUS
1000.0000 mL | Freq: Once | INTRAVENOUS | Status: AC
  Administered 2018-07-24: 1000 mL via INTRAVENOUS

## 2018-07-24 NOTE — ED Triage Notes (Signed)
Per ems pt was at work at 10am and became SOB. Developed CP and N&V at noon. Received 324 aspirin and 2 nitro en route. Also received 4mg  zofran.

## 2018-07-24 NOTE — ED Provider Notes (Signed)
[facility] EMERGENCY DEPARTMENT Provider Note   CSN: [id number] Arrival date & time: 07/24/18  0205     History   Chief Complaint Chief Complaint  Patient presents with  . Chest Pain  . Shortness of Breath    HPI [name] is a 42 y.o. female.  42 yo F with a chief complaint of chest pain.  Feels like the muscles around her heart or squeezing.  Constant pain started about noon today.  Nothing seems to make it better or worse.  She eventually called 911 because she has laid down at home and it is not improved.  She was given Zofran and nitro with some improvement.  She had been having nausea and vomiting with pain.  Nonexertional.  Denies history of PE or DVT denies recent surgery prolonged travel estrogen use.  She denies history of MI, hypertension, hyperlipidemia.  She has a history of diabetes.  Father had an MI in his early 70s.  The history is provided by the patient.  Chest Pain   This is a new problem. The current episode started 6 to 12 hours ago. The problem occurs constantly. The problem has been resolved. The pain is present in the lateral region. The pain is at a severity of 9/10. The pain is moderate. The quality of the pain is described as brief. The pain does not radiate. Duration of episode(s) is 12 hours. Associated symptoms include nausea and vomiting. Pertinent negatives include no dizziness, no fever, no headaches, no palpitations and no shortness of breath. She has tried nothing for the symptoms. The treatment provided no relief.  Her past medical history is significant for diabetes.  Pertinent negatives for past medical history include no DVT, no hyperlipidemia, no hypertension, no MI and no PE.  Her family medical history is significant for early MI (father in 22's).  Shortness of Breath  Associated symptoms include chest pain and vomiting. Pertinent negatives include no fever, no headaches, no rhinorrhea and no wheezing. Associated  medical issues do not include PE, past MI or DVT.    Past Medical History:  Diagnosis Date  . Abscess 11/2012   RIGHT FINGER  . Arthritis   . Cataracts, bilateral   . Chronic back pain   . Diabetes mellitus    type 1  . Diabetic neuropathy (HCC)   . Herpes virus disease   . Hip fracture (HCC)    left side  . History of blood transfusion 2013   left  hip- pinning  . Shortness of breath dyspnea    with exertion  . Urinary tract infection    hx of    Patient Active Problem List   Diagnosis Date Noted  . 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 S/P repeat low transverse C-section 09/10/2013  . Cellulitis and abscess 12/22/2012  . Diabetes (HCC) 11/25/2012    Past Surgical History:  Procedure Laterality Date  . CATARACT EXTRACTION     left  . CATARACT EXTRACTION W/PHACO  06/12/2012   Procedure: CATARACT EXTRACTION PHACO AND INTRAOCULAR LENS PLACEMENT (IOC);  Surgeon: [name], MD;  Location: [facility] OR;  Service: Ophthalmology;  Laterality: Right;  . CATARACT EXTRACTION W/PHACO  06/15/2012   Procedure: CATARACT EXTRACTION PHACO AND INTRAOCULAR LENS PLACEMENT (IOC);  Surgeon: [name], MD;  Location: [facility] OR;  Service: Ophthalmology;  Laterality: Left;  . CESAREAN SECTION     x 3  . CESAREAN SECTION WITH BILATERAL TUBAL LIGATION Bilateral 09/10/2013   Procedure: REPEAT CESAREAN  SECTION WITH BILATERAL TUBAL LIGATION;  Surgeon: Purcell Nails, MD;  Location: [facility] ORS;  Service: Obstetrics;  Laterality: Bilateral;  . EYE SURGERY Bilateral   . FRACTURE SURGERY     surgery left hip  . I&D EXTREMITY Right 11/23/2012   Procedure: IRRIGATION AND DEBRIDEMENT EXTREMITY;  Surgeon: Jodi Marble, MD;  Location: [facility] OR;  Service: Orthopedics;  Laterality: Right;  . I&D EXTREMITY Right 11/30/2012   Procedure: IRRIGATION AND DEBRIDEMENT Right hand Abscess;  Surgeon: Jodi Marble, MD;  Location: [facility] OR;  Service: Orthopedics;  Laterality: Right;  . TOTAL HIP ARTHROPLASTY Left 10/12/2014     Procedure: TOTAL HIP ARTHROPLASTY;  Surgeon: Nadara Mustard, MD;  Location: [facility] OR;  Service: Orthopedics;  Laterality: Left;  . TUBAL LIGATION       OB History    Gravida  4   Para  3   Term  1   Preterm      AB  1   Living  3     SAB      TAB      Ectopic      Multiple      Live Births  1            Home Medications    Prior to Admission medications   Medication Sig Start Date End Date Taking? Authorizing Provider  cyclobenzaprine (FLEXERIL) 10 MG tablet Take 10 mg by mouth 3 (three) times daily as needed for muscle spasms.   Yes [provider]  ferrous sulfate 325 (65 FE) MG tablet Take 325 mg by mouth daily with breakfast.   Yes [provider]  gabapentin (NEURONTIN) 100 MG capsule Take 100 mg by mouth 3 (three) times daily.   Yes [provider]  ibuprofen (ADVIL,MOTRIN) 600 MG tablet Take 600 mg by mouth every 6 (six) hours as needed for mild pain.   Yes [provider]  insulin detemir (LEVEMIR) 100 UNIT/ML injection Inject 50 Units into the skin daily.    Yes [provider]  Liraglutide 18 MG/3ML SOPN Inject 0.6 mLs into the skin at bedtime.   Yes [provider]  Multiple Vitamin (MULTIVITAMIN WITH MINERALS) TABS tablet Take 1 tablet by mouth daily.   Yes [provider]  simvastatin (ZOCOR) 10 MG tablet Take 10 mg by mouth daily.   Yes [provider]  vitamin B-12 (CYANOCOBALAMIN) 100 MCG tablet Take 100 mcg by mouth daily.   Yes [provider]  zolpidem (AMBIEN) 10 MG tablet Take 10 mg by mouth at bedtime as needed for sleep.   Yes [provider]  aspirin EC 325 MG tablet Take 1 tablet (325 mg total) by mouth daily. Patient not taking: Reported on 07/24/2018 10/12/14   Nadara Mustard, MD  fluconazole (DIFLUCAN) 200 MG tablet Take 1 tablet (200 mg total) by mouth daily. Patient not taking: Reported on 07/24/2018 06/24/16   Orvilla Cornwall A, CNM  metroNIDAZOLE  (METROGEL VAGINAL) 0.75 % vaginal gel Place 1 Applicatorful vaginally 2 (two) times a week. For 4-6 months. Patient not taking: Reported on 07/24/2018 06/24/16   Orvilla Cornwall A, CNM  oxyCODONE-acetaminophen (ROXICET) 5-325 MG per tablet Take 1 tablet by mouth every 4 (four) hours as needed for severe pain. Patient not taking: Reported on 07/24/2018 10/12/14   Nadara Mustard, MD  terconazole (TERAZOL 7) 0.4 % vaginal cream Place 1 applicator vaginally at bedtime. Patient not taking: Reported on 07/24/2018 06/24/16   Orvilla Cornwall  A, CNM  valACYclovir (VALTREX) 500 MG tablet Take 1 tablet (500 mg total) by mouth 2 (two) times daily. For 2 weeks. Patient not taking: Reported on 07/24/2018 07/18/17   Roe Coombs, CNM    Family History Family History  Problem Relation Age of Onset  . Diabetes Mother   . Heart disease Mother     Social History Social History   Tobacco Use  . Smoking status: Never Smoker  . Smokeless tobacco: Never Used  Substance Use Topics  . Alcohol use: No    Alcohol/week: 0.0 standard drinks  . Drug use: No     Allergies   Phenergan [promethazine hcl] and Tramadol   Review of Systems Review of Systems  Constitutional: Negative for chills and fever.  HENT: Negative for congestion and rhinorrhea.   Eyes: Negative for redness and visual disturbance.  Respiratory: Negative for shortness of breath and wheezing.   Cardiovascular: Positive for chest pain. Negative for palpitations.  Gastrointestinal: Positive for nausea and vomiting.  Genitourinary: Negative for dysuria and urgency.  Musculoskeletal: Negative for arthralgias and myalgias.  Skin: Negative for pallor and wound.  Neurological: Negative for dizziness and headaches.     Physical Exam Updated Vital Signs BP 100/64   Pulse 78   Resp 11   Ht 5\' 2"  (1.575 m)   Wt 101.6 kg   SpO2 91%   BMI 40.97 kg/m   Physical Exam  Constitutional: She is oriented to person, place, and time. She  appears well-developed and well-nourished. No distress.  HENT:  Head: Normocephalic and atraumatic.  Eyes: Pupils are equal, round, and reactive to light. EOM are normal.  Neck: Normal range of motion. Neck supple.  Cardiovascular: Normal rate and regular rhythm. Exam reveals no gallop and no friction rub.  No murmur heard. Pulmonary/Chest: Effort normal. She has no wheezes. She has no rales. She exhibits tenderness (tender about the left sternal border).  Abdominal: Soft. She exhibits no distension. There is no tenderness.  Musculoskeletal: She exhibits no edema or tenderness.  Neurological: She is alert and oriented to person, place, and time.  Skin: Skin is warm and dry. She is not diaphoretic.  Psychiatric: She has a normal mood and affect. Her behavior is normal.  Nursing note and vitals reviewed.    ED Treatments / Results  Labs (all labs ordered are listed, but only abnormal results are displayed) Labs Reviewed  BASIC METABOLIC PANEL - Abnormal; Notable for the following components:      Result Value   Sodium 132 (*)    Glucose, Bld 257 (*)    All other components within normal limits  CBC - Abnormal; Notable for the following components:   WBC 13.4 (*)    RBC 5.19 (*)    All other components within normal limits  I-STAT TROPONIN, ED  I-STAT BETA HCG BLOOD, ED ([facility], [facility], [facility] ONLY)  I-STAT TROPONIN, ED  I-STAT TROPONIN, ED    EKG EKG Interpretation  Date/Time:  Friday July 24 2018 02:16:14 EST Ventricular Rate:  95 PR Interval:    QRS Duration: 76 QT Interval:  343 QTC Calculation: 432 R Axis:   84 Text Interpretation:  Sinus rhythm No significant change since last tracing Confirmed by Melene Plan [phone]) on 07/24/2018 3:12:50 AM   Radiology Dg Chest 2 View  Result Date: 07/24/2018 CLINICAL DATA:  Shortness of breath since 10 a.m. today. Then developed chest pain, nausea, and vomiting. EXAM: CHEST - 2 VIEW COMPARISON:  12/11/2012 FINDINGS: The  heart size and  mediastinal contours are within normal limits. Both lungs are clear. The visualized skeletal structures are unremarkable. IMPRESSION: No active cardiopulmonary disease. Electronically Signed   By: Burman Nieves M.D.   On: 07/24/2018 03:02    Procedures Procedures (including critical care time)  Medications Ordered in ED Medications  sodium chloride 0.9 % bolus 1,000 mL (0 mLs Intravenous Stopped 07/24/18 0413)     Initial Impression / Assessment and Plan / ED Course  I have reviewed the triage vital signs and the nursing notes.  Pertinent labs & imaging results that were available during my care of the patient were reviewed by me and considered in my medical decision making (see chart for details).     42 yo F with a chief complaint of chest pain.  This is atypical in nature.  Reproduced on palpation on exam.  The one concern that I have is that the patient did have nausea and vomiting associated with this.  Will obtain delta troponin EKG chest x-ray lab work.  Delta trop negative. PERC negative. Dc home.   6:19 AM:  I have discussed the diagnosis/risks/treatment options with the patient and believe the pt to be eligible for discharge home to follow-up with PCP. We also discussed returning to the ED immediately if new or worsening sx occur. We discussed the sx which are most concerning (e.g., sudden worsening pain, fever, inability to tolerate by mouth) that necessitate immediate return. Medications administered to the patient during their visit and any new prescriptions provided to the patient are listed below.  Medications given during this visit Medications  sodium chloride 0.9 % bolus 1,000 mL (0 mLs Intravenous Stopped 07/24/18 0413)      The patient appears reasonably screen and/or stabilized for discharge and I doubt any other medical condition or other [facility] requiring further screening, evaluation, or treatment in the ED at this time prior to discharge.    Final Clinical  Impressions(s) / ED Diagnoses   Final diagnoses:  Atypical chest pain    ED Discharge Orders    None       Melene Plan, DO 07/24/18 1610

## 2018-07-24 NOTE — ED Notes (Signed)
Patient verbalizes understanding of discharge instructions. Opportunity for questioning and answers were provided. Armband removed by staff, pt discharged from ED ambulatory.   

## 2018-07-24 NOTE — Discharge Instructions (Signed)
Take 4 over the counter ibuprofen tablets 3 times a day or 2 over-the-counter naproxen tablets twice a day for pain. Also take tylenol 1000mg(2 extra strength) four times a day.    

## 2018-08-05 ENCOUNTER — Ambulatory Visit: Payer: Medicare Other | Admitting: Rheumatology
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
## 2018-09-03 ENCOUNTER — Encounter: Payer: Self-pay | Admitting: Rheumatology

## 2018-09-03 ENCOUNTER — Ambulatory Visit (INDEPENDENT_AMBULATORY_CARE_PROVIDER_SITE_OTHER): Payer: Medicare Other

## 2018-09-03 ENCOUNTER — Ambulatory Visit (INDEPENDENT_AMBULATORY_CARE_PROVIDER_SITE_OTHER): Payer: Self-pay

## 2018-09-03 ENCOUNTER — Telehealth (INDEPENDENT_AMBULATORY_CARE_PROVIDER_SITE_OTHER): Payer: Self-pay

## 2018-09-03 ENCOUNTER — Ambulatory Visit (INDEPENDENT_AMBULATORY_CARE_PROVIDER_SITE_OTHER): Payer: Medicare Other | Admitting: Rheumatology

## 2018-09-03 ENCOUNTER — Telehealth (INDEPENDENT_AMBULATORY_CARE_PROVIDER_SITE_OTHER): Payer: Self-pay | Admitting: Radiology

## 2018-09-03 VITALS — BP 99/72 | HR 72 | Resp 14 | Ht 62.5 in | Wt 233.6 lb

## 2018-09-03 DIAGNOSIS — M25552 Pain in left hip: Secondary | ICD-10-CM | POA: Diagnosis not present

## 2018-09-03 DIAGNOSIS — M25551 Pain in right hip: Secondary | ICD-10-CM | POA: Diagnosis not present

## 2018-09-03 DIAGNOSIS — M1611 Unilateral primary osteoarthritis, right hip: Secondary | ICD-10-CM | POA: Diagnosis not present

## 2018-09-03 DIAGNOSIS — G8929 Other chronic pain: Secondary | ICD-10-CM

## 2018-09-03 DIAGNOSIS — Z8639 Personal history of other endocrine, nutritional and metabolic disease: Secondary | ICD-10-CM

## 2018-09-03 DIAGNOSIS — M25512 Pain in left shoulder: Secondary | ICD-10-CM | POA: Diagnosis not present

## 2018-09-03 DIAGNOSIS — G4709 Other insomnia: Secondary | ICD-10-CM

## 2018-09-03 DIAGNOSIS — Z96642 Presence of left artificial hip joint: Secondary | ICD-10-CM

## 2018-09-03 NOTE — Telephone Encounter (Signed)
Called Dr. Corliss Skainseveshwar ext [phone] wanting you to look at left hip xray and let her know your impression please. Sticky note message left on desk.

## 2018-09-03 NOTE — Telephone Encounter (Signed)
Patient was seen by Dr. Bedelia Personevenshwar today with left hip x-rays, Dr. Lajoyce Cornersuda advised on left hip x-rays. Per Dr. Bedelia Personevenshwar, I called patient to inform no loosening of hardware or fracture, that if pain continues Dr. Lajoyce Cornersuda would be happy to see her again. Patient asked about physical therapy, advised that an appointment would need to be made for evaluation first, she will call back after the holidays for appointment with Dr. Lajoyce Cornersuda.

## 2018-09-17 ENCOUNTER — Encounter (HOSPITAL_COMMUNITY): Payer: Self-pay

## 2018-09-17 ENCOUNTER — Ambulatory Visit (HOSPITAL_COMMUNITY)
Admission: RE | Admit: 2018-09-17 | Discharge: 2018-09-17 | Disposition: A | Discharge status: Home / Self Care | Payer: Medicare Other | Source: Ambulatory Visit | Attending: Rheumatology | Admitting: Rheumatology

## 2018-09-17 DIAGNOSIS — G8929 Other chronic pain: Secondary | ICD-10-CM | POA: Insufficient documentation

## 2018-09-17 DIAGNOSIS — M25512 Pain in left shoulder: Secondary | ICD-10-CM | POA: Insufficient documentation

## 2018-09-17 NOTE — Progress Notes (Signed)
[name] came to our department for shoulder MRI.  Pt is severely claustrophobic and wanted an Open MRI.  We do not have an open MRI at our facility.  We have a wide bore only.  Pt tried several times to try and go into the scanner, each technique was met without success.   May I suggest the larger bore at [facility], or try again on wide bore machine with PO medications to relax her before the scan.  Thank you.

## 2018-09-22 ENCOUNTER — Encounter (INDEPENDENT_AMBULATORY_CARE_PROVIDER_SITE_OTHER): Payer: Medicare Other | Admitting: Ophthalmology

## 2018-10-06 ENCOUNTER — Encounter (INDEPENDENT_AMBULATORY_CARE_PROVIDER_SITE_OTHER): Payer: Medicare Other | Admitting: Ophthalmology

## 2018-11-12 ENCOUNTER — Other Ambulatory Visit: Payer: Self-pay | Admitting: Obstetrics and Gynecology

## 2018-11-12 DIAGNOSIS — E049 Nontoxic goiter, unspecified: Secondary | ICD-10-CM

## 2018-11-13 ENCOUNTER — Other Ambulatory Visit: Payer: Medicare Other

## 2018-11-18 ENCOUNTER — Other Ambulatory Visit: Payer: Medicare Other

## 2018-11-20 ENCOUNTER — Ambulatory Visit
Admission: RE | Admit: 2018-11-20 | Discharge: 2018-11-20 | Disposition: A | Discharge status: Home / Self Care | Payer: Medicare Other | Source: Ambulatory Visit | Attending: Obstetrics and Gynecology | Admitting: Obstetrics and Gynecology

## 2018-11-20 DIAGNOSIS — E049 Nontoxic goiter, unspecified: Secondary | ICD-10-CM

## 2019-03-29 ENCOUNTER — Telehealth: Payer: Self-pay | Admitting: Rheumatology

## 2019-03-29 NOTE — Telephone Encounter (Signed)
Attempted to contact the patient and left message for patient to call the office. Patient will need to contact centralized scheduling and schedule appointment for open MRI. Number [phone].

## 2019-03-29 NOTE — Telephone Encounter (Signed)
Patient called stating Dr. Estanislado Pandy referred her for an MRI of her left shoulder.  Patient states due to being claustrophobic she was not able to have the MRI.  Patient states she was told she would need an open MRI and they would contact Dr. Estanislado Pandy.  Patient states her left shoulder is still painful and is requesting a referral for an open MRI.

## 2019-03-31 NOTE — Telephone Encounter (Signed)
Patient returned call to the office. Patient provided with number to centralized scheduling to call and make appointment that works for her.

## 2019-07-21 ENCOUNTER — Encounter (INDEPENDENT_AMBULATORY_CARE_PROVIDER_SITE_OTHER): Payer: Medicare Other | Admitting: Ophthalmology

## 2019-07-28 ENCOUNTER — Encounter (INDEPENDENT_AMBULATORY_CARE_PROVIDER_SITE_OTHER): Payer: Medicare Other | Admitting: Ophthalmology

## 2020-01-31 ENCOUNTER — Ambulatory Visit (INDEPENDENT_AMBULATORY_CARE_PROVIDER_SITE_OTHER): Payer: Medicare Other | Admitting: Physician Assistant

## 2020-01-31 ENCOUNTER — Other Ambulatory Visit: Payer: Self-pay

## 2020-01-31 ENCOUNTER — Encounter: Payer: Self-pay | Admitting: Orthopedic Surgery

## 2020-01-31 ENCOUNTER — Ambulatory Visit: Payer: Self-pay

## 2020-01-31 VITALS — Ht 62.0 in | Wt 240.0 lb

## 2020-01-31 DIAGNOSIS — M25552 Pain in left hip: Secondary | ICD-10-CM | POA: Diagnosis not present
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
## 2020-02-10 ENCOUNTER — Ambulatory Visit: Payer: Medicare Other | Attending: Physician Assistant | Admitting: Physical Therapy

## 2020-02-22 ENCOUNTER — Other Ambulatory Visit: Payer: Self-pay

## 2020-02-22 ENCOUNTER — Encounter: Payer: Self-pay | Admitting: Physical Therapy

## 2020-02-22 ENCOUNTER — Ambulatory Visit: Payer: Medicare Other | Attending: Physician Assistant | Admitting: Physical Therapy

## 2020-02-22 DIAGNOSIS — M25652 Stiffness of left hip, not elsewhere classified: Secondary | ICD-10-CM

## 2020-02-22 DIAGNOSIS — M25552 Pain in left hip: Secondary | ICD-10-CM

## 2020-02-22 DIAGNOSIS — M6281 Muscle weakness (generalized): Secondary | ICD-10-CM | POA: Diagnosis present

## 2020-02-22 DIAGNOSIS — R262 Difficulty in walking, not elsewhere classified: Secondary | ICD-10-CM
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
## 2020-02-29 ENCOUNTER — Ambulatory Visit: Payer: Medicare Other | Admitting: Rehabilitative and Restorative Service Providers"

## 2020-03-02 ENCOUNTER — Other Ambulatory Visit: Payer: Self-pay

## 2020-03-02 ENCOUNTER — Encounter: Payer: Self-pay | Admitting: Physical Therapy

## 2020-03-02 ENCOUNTER — Ambulatory Visit: Payer: Medicare Other | Admitting: Physical Therapy

## 2020-03-02 DIAGNOSIS — R262 Difficulty in walking, not elsewhere classified: Secondary | ICD-10-CM

## 2020-03-02 DIAGNOSIS — M25652 Stiffness of left hip, not elsewhere classified: Secondary | ICD-10-CM

## 2020-03-02 DIAGNOSIS — M6281 Muscle weakness (generalized): Secondary | ICD-10-CM

## 2020-03-02 DIAGNOSIS — M25552 Pain in left hip: Secondary | ICD-10-CM

## 2020-03-02 NOTE — Therapy (Signed)
[place], [place], [address] Phone: [phone]   Fax:  [phone]  Physical Therapy Treatment  Patient Details  Name: [name] MRN: [id number] Date of Birth: [birth year] Referring Provider (PT): Bevely Palmer Persons, [place]   Encounter Date: 03/02/2020   PT End of Session - 03/02/20 1031    Visit Number 2    Number of Visits 12    Date for PT Re-Evaluation 04/04/20    Authorization Type MCR-progress note by visit 10    PT Start Time 1028   pt. arrived late   PT Stop Time 1058    PT Time Calculation (min) 30 min    Activity Tolerance Patient tolerated treatment well    Behavior During Therapy [facility] for tasks assessed/performed           Past Medical History:  Diagnosis Date  . Abscess 11/2012   RIGHT FINGER  . Arthritis   . Cataracts, bilateral   . Chronic back pain   . Diabetes mellitus    type 1  . Diabetic neuropathy ([place])   . Herpes virus disease   . Hip fracture (HCC)    left side  . History of blood transfusion 2013   left  hip- pinning  . Shortness of breath dyspnea    with exertion  . Urinary tract infection    hx of    Past Surgical History:  Procedure Laterality Date  . BILATERAL SALPINGECTOMY    . CATARACT EXTRACTION     left  . CATARACT EXTRACTION W/PHACO  06/12/2012   Procedure: CATARACT EXTRACTION PHACO AND INTRAOCULAR LENS PLACEMENT (IOC);  Surgeon: Adonis Brook, MD;  Location: [place];  Service: Ophthalmology;  Laterality: Right;  . CATARACT EXTRACTION W/PHACO  06/15/2012   Procedure: CATARACT EXTRACTION PHACO AND INTRAOCULAR LENS PLACEMENT (IOC);  Surgeon: Adonis Brook, MD;  Location: [place];  Service: Ophthalmology;  Laterality: Left;  . CESAREAN SECTION     x 3  . CESAREAN SECTION WITH BILATERAL TUBAL LIGATION Bilateral 09/10/2013   Procedure: REPEAT CESAREAN SECTION WITH BILATERAL TUBAL LIGATION;  Surgeon: Delice Lesch, MD;  Location: [place] [facility];  Service: Obstetrics;   Laterality: Bilateral;  . EYE SURGERY Bilateral   . FRACTURE SURGERY     surgery left hip  . HAND SURGERY Right 11/2012  . I & D EXTREMITY Right 11/23/2012   Procedure: IRRIGATION AND DEBRIDEMENT EXTREMITY;  Surgeon: Jolyn Nap, MD;  Location: [place];  Service: Orthopedics;  Laterality: Right;  . I & D EXTREMITY Right 11/30/2012   Procedure: IRRIGATION AND DEBRIDEMENT Right hand Abscess;  Surgeon: Jolyn Nap, MD;  Location: [place];  Service: Orthopedics;  Laterality: Right;  . TOTAL HIP ARTHROPLASTY Left 10/12/2014   Procedure: TOTAL HIP ARTHROPLASTY;  Surgeon: Newt Minion, MD;  Location: [place];  Service: Orthopedics;  Laterality: Left;    There were no vitals filed for this visit.   Subjective Assessment - 03/02/20 1030    Subjective No pain pre-tx. but pt. reports recent issues with muscle spasm in right hip with difficulty getting up/performing transfers.    Pertinent History acetabular fracture with ORIF 2006, left posterior THA 2016, diabetic    Diagnostic tests X-rays    Patient Stated Goals Get hip better, improve walking ability    Currently in Pain? No/denies  [facility] Adult PT Treatment/Exercise - 03/02/20 0001      Exercises   Exercises Knee/Hip      Knee/Hip Exercises: Aerobic   Nustep L3 x 5 min LE only      Knee/Hip Exercises: Standing   Heel Raises Both;15 reps    Heel Raises Limitations Airex    Hip Flexion Limitations Airex marches x 20    Hip Abduction AROM;Stengthening;Right;Left;15 reps;Knee straight    Abduction Limitations 2 lb. ankle weights    Hip Extension AROM;Stengthening;Right;Left;15 reps;Knee straight    Lateral Step Up Right;Left;15 reps;Step Height: 4"    Forward Step Up Right;Left;1 set;15 reps;Hand Hold: 1;Step Height: 4"    Functional Squat 15 reps    Functional Squat Limitations squat at counter    Rocker Board 2 minutes    Rocker Board Limitations 1 min ea. dynamic balance lateral and  fw/rev    Other Standing Knee Exercises Romberg feet together eyes closed on Airex 20 sec x 3 with CGA    Other Standing Knee Exercises Deadlift 10 lb. KB from chair seat for first 5 reps then 10 more reps from 12 in. height      Knee/Hip Exercises: Seated   Long Arc Quad AROM;Strengthening;Left;2 sets;10 reps    Long Arc Quad Weight 4 lbs.                       PT Long Term Goals - 02/22/20 1428      PT LONG TERM GOAL #1   Title Independent with HEP    Baseline needs HEP    Time 6    Period Weeks    Status New    Target Date 04/04/20      PT LONG TERM GOAL #2   Title Improve FOTO outcome measure score to 57% or less impairment    Baseline 71% limited    Time 6    Period Weeks    Status New    Target Date 04/04/20      PT LONG TERM GOAL #3   Title Increase left hip and knee strength at least grossly 1/2 MMT grade to improve gait mechanics for decreased bilat. hip pain and to improve ability for stair navigation and transfers from low seating    Baseline see objective    Time 6    Period Weeks    Status New    Target Date 04/04/20      PT LONG TERM GOAL #4   Title Improve Romberg eyes closed on Airex at least 10 sec and SLS to 10 sec or greater bilat. for improved baalnce to decrease fall risk    Baseline unable Romberg, SLS x 4 sec left, 8 sec right    Time 6    Period Weeks    Status New    Target Date 04/04/20      PT LONG TERM GOAL #5   Title Tolerate standing and ambulation for chores and shopping peridos at least 30 min without limitation due to hip pain    Time 6    Period Weeks    Status New    Target Date 04/04/20      Additional Long Term Goals   Additional Long Term Goals Yes      PT LONG TERM GOAL #6   Title Berg goal to be set pending assessment                 Plan - 03/02/20 1032  Clinical Impression Statement Held assessment of Sharlene Motts due to time contraints from pt. arriving late today with tx. focus hip strengthening and  work on balance. Given symptom duration/etiology and comorbidities expect progress to address strength and balance deficits will be gradual so progress for goals ongoing.    Personal Factors and Comorbidities Time since onset of injury/illness/exacerbation;Comorbidity 2    Comorbidities diabetic, obesity    Examination-Activity Limitations Squat;Lift;Stairs;Locomotion Level;Stand    Examination-Participation Restrictions Community Activity;Shop    Stability/Clinical Decision Making Evolving/Moderate complexity    Clinical Decision Making Moderate    Rehab Potential Good    PT Frequency 2x / week    PT Duration 6 weeks    PT Treatment/Interventions ADLs/Self Care Home Management;Cryotherapy;Electrical Stimulation;Moist Heat;Iontophoresis 4mg /ml Dexamethasone;Ultrasound;Gait training;Therapeutic exercise;Balance training;Stair training;Functional mobility training;Neuromuscular re-education;Therapeutic activities;Patient/family education;Manual techniques;Passive range of motion;Taping    PT Next Visit Plan history left posterior THA 2016, check Berg Balance, NUSTEP, pending tolerance and step ups, general hip and knee strengthening open and closed chain as tolerated, hip ROM but caution excessive flexion/adduction/IR due to history posteruir THA    PT Home Exercise Plan [id number]: clamshell (issued red band), hip bridge, hip adduction isometric, hip abd SLR in standing, standing march, Romberg, SLS    Consulted and Agree with Plan of Care Patient           Patient will benefit from skilled therapeutic intervention in order to improve the following deficits and impairments:  Abnormal gait, Difficulty walking, Hypomobility, Decreased balance, Pain, Decreased strength, Decreased activity tolerance, Decreased range of motion  Visit Diagnosis: Pain in left hip  Stiffness of left hip, not elsewhere classified  Muscle weakness (generalized)  Difficulty in walking, not elsewhere  classified     Problem List Patient Active Problem List   Diagnosis Date Noted  . 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 S/P repeat low transverse C-section 09/10/2013  . Cellulitis and abscess 12/22/2012  . Diabetes (HCC) 11/25/2012    01/25/2013, PT, DPT 03/02/20 10:59 AM  [facility] [address] [place], [place], [place] Phone: [phone]   Fax:  [phone]  Name: [name] MRN: [name] Date of Birth: [birth year]

## 2020-03-06 ENCOUNTER — Ambulatory Visit: Payer: Medicare Other | Admitting: Rehabilitative and Restorative Service Providers"

## 2020-03-06 ENCOUNTER — Other Ambulatory Visit: Payer: Self-pay

## 2020-03-06 ENCOUNTER — Encounter: Payer: Self-pay | Admitting: Rehabilitative and Restorative Service Providers"

## 2020-03-06 DIAGNOSIS — R262 Difficulty in walking, not elsewhere classified: Secondary | ICD-10-CM

## 2020-03-06 DIAGNOSIS — M6281 Muscle weakness (generalized): Secondary | ICD-10-CM

## 2020-03-06 DIAGNOSIS — M25652 Stiffness of left hip, not elsewhere classified: Secondary | ICD-10-CM

## 2020-03-06 DIAGNOSIS — M25552 Pain in left hip: Secondary | ICD-10-CM | POA: Diagnosis not present

## 2020-03-06 NOTE — Therapy (Signed)
[facility] Outpatient Rehabilitation [facility] [address] [place], [place], [address] Phone: [phone]   Fax:  [phone]  Physical Therapy Treatment  Patient Details  Name: [name] MRN: [id number] Date of Birth: 12-27-1975 Referring Provider (PT): [name] [name], [place]   Encounter Date: 03/06/2020   PT End of Session - 03/06/20 1636    Visit Number 3    Number of Visits 12    Date for PT Re-Evaluation 04/04/20    Authorization Type MCR-progress note by visit 10    PT Start Time 0425    PT Stop Time 0506    PT Time Calculation (min) 41 min    Activity Tolerance Patient tolerated treatment well    Behavior During Therapy [facility] for tasks assessed/performed           Past Medical History:  Diagnosis Date  . Abscess 11/2012   RIGHT FINGER  . Arthritis   . Cataracts, bilateral   . Chronic back pain   . Diabetes mellitus    type 1  . Diabetic neuropathy (HCC)   . Herpes virus disease   . Hip fracture (HCC)    left side  . History of blood transfusion 2013   left  hip- pinning  . Shortness of breath dyspnea    with exertion  . Urinary tract infection    hx of    Past Surgical History:  Procedure Laterality Date  . BILATERAL SALPINGECTOMY    . CATARACT EXTRACTION     left  . CATARACT EXTRACTION W/PHACO  06/12/2012   Procedure: CATARACT EXTRACTION PHACO AND INTRAOCULAR LENS PLACEMENT (IOC);  Surgeon: [name], MD;  Location: [facility];  Service: Ophthalmology;  Laterality: Right;  . CATARACT EXTRACTION W/PHACO  06/15/2012   Procedure: CATARACT EXTRACTION PHACO AND INTRAOCULAR LENS PLACEMENT (IOC);  Surgeon: [name], MD;  Location: [facility];  Service: Ophthalmology;  Laterality: Left;  . CESAREAN SECTION     x 3  . CESAREAN SECTION WITH BILATERAL TUBAL LIGATION Bilateral 09/10/2013   Procedure: REPEAT CESAREAN SECTION WITH BILATERAL TUBAL LIGATION;  Surgeon: Purcell Nails, MD;  Location: [facility];  Service: Obstetrics;  Laterality:  Bilateral;  . EYE SURGERY Bilateral   . FRACTURE SURGERY     surgery left hip  . HAND SURGERY Right 11/2012  . I & D EXTREMITY Right 11/23/2012   Procedure: IRRIGATION AND DEBRIDEMENT EXTREMITY;  Surgeon: Jodi Marble, MD;  Location: [facility];  Service: Orthopedics;  Laterality: Right;  . I & D EXTREMITY Right 11/30/2012   Procedure: IRRIGATION AND DEBRIDEMENT Right hand Abscess;  Surgeon: Jodi Marble, MD;  Location: [facility] OR;  Service: Orthopedics;  Laterality: Right;  . TOTAL HIP ARTHROPLASTY Left 10/12/2014   Procedure: TOTAL HIP ARTHROPLASTY;  Surgeon: Nadara Mustard, MD;  [facility];  Service: Orthopedics;  Laterality: Left;    There were no vitals filed for this visit.   Subjective Assessment - 03/06/20 1627    Subjective My hip is good. My back is a 6/10 with slight radiating pain down both legs.    Currently in Pain? Yes    Pain Score 6     Pain Location Back    Pain Orientation Right;Left    Pain Descriptors / Indicators Aching    Pain Type Chronic pain    Pain Onset More than a month ago    Pain Frequency Intermittent    Multiple Pain Sites No  [facility] PT Assessment - 03/06/20 0001      Balance   Balance Assessed Yes      Standardized Balance Assessment   Standardized Balance Assessment Berg Balance Test      Berg Balance Test   Sit to Stand Able to stand without using hands and stabilize independently    Standing Unsupported Able to stand safely 2 minutes    Sitting with Back Unsupported but Feet Supported on Floor or Stool Able to sit safely and securely 2 minutes    Stand to Sit Sits safely with minimal use of hands    Transfers Able to transfer safely, minor use of hands    Standing Unsupported with Eyes Closed Able to stand 10 seconds safely    Standing Unsupported with Feet Together Able to place feet together independently and stand 1 minute safely    From Standing, Reach Forward with Outstretched Arm Can reach forward >5 cm safely (2")     From Standing Position, Pick up Object from Floor Able to pick up shoe safely and easily    From Standing Position, Turn to Look Behind Over each Shoulder Looks behind from both sides and weight shifts well    Turn 360 Degrees Able to turn 360 degrees safely in 4 seconds or less    Standing Unsupported, Alternately Place Feet on Step/Stool Able to stand independently and safely and complete 8 steps in 20 seconds    Standing Unsupported, One Foot in Front Needs help to step but can hold 15 seconds    Standing on One Leg Able to lift leg independently and hold equal to or more than 3 seconds    Total Score 49                         [facility] Adult PT Treatment/Exercise - 03/06/20 0001      Knee/Hip Exercises: Standing   Other Standing Knee Exercises Berg 49/56 for low falls risk; 6 inch step ups frontal/lateral x 20 each; standing 3 lb hamstring curl x 20; 3 lb hip abduction x 20. all performed bil       Knee/Hip Exercises: Seated   Other Seated Knee/Hip Exercises diagonal reaches to knee to stretch lumbar and glute x 10 each side      Knee/Hip Exercises: Supine   Other Supine Knee/Hip Exercises tilt with bridge x 10, bil clam shell x 15, heel slide x 15, with SLR x 15. all performed bil. SKTC with sheet with limited ROM hip flexion 3x15 sec; decompression stretch LEs only 5x15 sec; Lower trunk rotation x 5 with 15 sec hold each side                  PT Education - 03/06/20 1706    Education Details reviewed [name] findings of 49/56    Person(s) Educated Patient    Methods Explanation    Comprehension Verbalized understanding               PT Long Term Goals - 03/06/20 1703      PT LONG TERM GOAL #1   Title Independent with HEP    Baseline needs HEP    Time 6    Period Weeks    Status On-going      PT LONG TERM GOAL #2   Title Improve FOTO outcome measure score to 57% or less impairment    Baseline 71% limited    Time 6    Period  Weeks    Status  On-going      PT LONG TERM GOAL #3   Title Increase left hip and knee strength at least grossly 1/2 MMT grade to improve gait mechanics for decreased bilat. hip pain and to improve ability for stair navigation and transfers from low seating    Baseline see objective    Time 6    Period Weeks    Status On-going      PT LONG TERM GOAL #4   Title Improve Romberg eyes closed on Airex at least 10 sec and SLS to 10 sec or greater bilat. for improved baalnce to decrease fall risk    Time 6    Period Weeks    Status On-going      PT LONG TERM GOAL #5   Title Tolerate standing and ambulation for chores and shopping peridos at least 30 min without limitation due to hip pain    Time 6    Period Weeks    Status On-going      PT LONG TERM GOAL #6   Title Berg to be 53/56 for decreased falls risk    Baseline 49/56    Time 5    Period Weeks    Status New    Target Date 04/04/20                 Plan - 03/06/20 1632    Clinical Impression Statement Pt 10 min late. Denies hip pain this visit L but instead of achiness in low back.Pt reports no significant change in balance to date. Pt would benefit from further L LE strengthening and balance training per POC. No excessive hip flex/addct/IR due to history of THR per chart review    Rehab Potential Good    PT Frequency 2x / week    PT Duration 6 weeks    PT Treatment/Interventions ADLs/Self Care Home Management;Cryotherapy;Electrical Stimulation;Moist Heat;Iontophoresis 4mg /ml Dexamethasone;Ultrasound;Gait training;Therapeutic exercise;Balance training;Stair training;Functional mobility training;Neuromuscular re-education;Therapeutic activities;Patient/family education;Manual techniques;Passive range of motion;Taping    PT Next Visit Plan history left posterior THA 2016, NUSTEP, pending tolerance and step ups, general hip and knee strengthening open and closed chain as tolerated, hip ROM but caution excessive flexion/adduction/IR due to  history posteruir THA    PT Home Exercise Plan [id number]: clamshell (issued red band), hip bridge, hip adduction isometric, hip abd SLR in standing, standing march, Romberg, SLS    Consulted and Agree with Plan of Care Patient           Patient will benefit from skilled therapeutic intervention in order to improve the following deficits and impairments:  Abnormal gait, Difficulty walking, Hypomobility, Decreased balance, Pain, Decreased strength, Decreased activity tolerance, Decreased range of motion  Visit Diagnosis: Pain in left hip  Stiffness of left hip, not elsewhere classified  Muscle weakness (generalized)  Difficulty in walking, not elsewhere classified     Problem List Patient Active Problem List   Diagnosis Date Noted  . 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 S/P repeat low transverse C-section 09/10/2013  . Cellulitis and abscess 12/22/2012  . Diabetes (HCC) 11/25/2012    Charizma Gardiner, PT 03/06/2020, 5:08 PM  [facility] [address] [place], [place], [place] Phone: [phone]   Fax:  [phone]  Name: [name] MRN: [name] Date of Birth: 1975/10/10

## 2020-03-13 ENCOUNTER — Ambulatory Visit: Payer: Medicare Other | Admitting: Rehabilitative and Restorative Service Providers"

## 2020-03-16 ENCOUNTER — Encounter: Payer: Self-pay | Admitting: Rehabilitative and Restorative Service Providers"

## 2020-03-16 ENCOUNTER — Other Ambulatory Visit: Payer: Self-pay

## 2020-03-16 ENCOUNTER — Ambulatory Visit: Payer: Medicare Other | Attending: Physician Assistant | Admitting: Rehabilitative and Restorative Service Providers"

## 2020-03-16 DIAGNOSIS — M25652 Stiffness of left hip, not elsewhere classified: Secondary | ICD-10-CM

## 2020-03-16 DIAGNOSIS — M6281 Muscle weakness (generalized): Secondary | ICD-10-CM | POA: Diagnosis present

## 2020-03-16 DIAGNOSIS — R262 Difficulty in walking, not elsewhere classified: Secondary | ICD-10-CM | POA: Diagnosis present

## 2020-03-16 DIAGNOSIS — M25552 Pain in left hip: Secondary | ICD-10-CM | POA: Diagnosis not present

## 2020-03-16 NOTE — Patient Instructions (Addendum)
PT discussed how leg length discrepancy can affect the back. Pt agreed and stated she has noticed the same. PT discussed how length of legs can affect LBP and neutrality of spine. Talked about bringing closed toed shoes next visit to do a trial of a heel lift. Discussed Trendelenburg + for L glute med weakness; however, there is minimal dip but still present.

## 2020-03-16 NOTE — Therapy (Signed)
[facility] Outpatient Rehabilitation [facility] [address] [place], [place], [address] Phone: [phone]   Fax:  [phone]  Physical Therapy Treatment  Patient Details  Name: [name] MRN: [id number] Date of Birth: 02/16/1976 Referring Provider (PT): [place] Persons, [place]   Encounter Date: 03/16/2020   PT End of Session - 03/16/20 1449    Visit Number 4    Number of Visits 12    Date for PT Re-Evaluation 04/04/20    Authorization Type MCR-progress note by visit 10    PT Start Time 0225    PT Stop Time 0310    PT Time Calculation (min) 45 min    Activity Tolerance Patient tolerated treatment well;No increased pain    Behavior During Therapy [facility] for tasks assessed/performed           Past Medical History:  Diagnosis Date  . Abscess 11/2012   RIGHT FINGER  . Arthritis   . Cataracts, bilateral   . Chronic back pain   . Diabetes mellitus    type 1  . Diabetic neuropathy (HCC)   . Herpes virus disease   . Hip fracture (HCC)    left side  . History of blood transfusion 2013   left  hip- pinning  . Shortness of breath dyspnea    with exertion  . Urinary tract infection    hx of    Past Surgical History:  Procedure Laterality Date  . BILATERAL SALPINGECTOMY    . CATARACT EXTRACTION     left  . CATARACT EXTRACTION W/PHACO  06/12/2012   Procedure: CATARACT EXTRACTION PHACO AND INTRAOCULAR LENS PLACEMENT (IOC);  Surgeon: [name], MD;  Location: [facility] OR;  Service: Ophthalmology;  Laterality: Right;  . CATARACT EXTRACTION W/PHACO  06/15/2012   Procedure: CATARACT EXTRACTION PHACO AND INTRAOCULAR LENS PLACEMENT (IOC);  Surgeon: [name], MD;  Location: [facility] OR;  Service: Ophthalmology;  Laterality: Left;  . CESAREAN SECTION     x 3  . CESAREAN SECTION WITH BILATERAL TUBAL LIGATION Bilateral 09/10/2013   Procedure: REPEAT CESAREAN SECTION WITH BILATERAL TUBAL LIGATION;  Surgeon: Purcell Nails, MD;  Location: [facility];  Service: Obstetrics;   Laterality: Bilateral;  . EYE SURGERY Bilateral   . FRACTURE SURGERY     surgery left hip  . HAND SURGERY Right 11/2012  . I & D EXTREMITY Right 11/23/2012   Procedure: IRRIGATION AND DEBRIDEMENT EXTREMITY;  Surgeon: Jodi Marble, MD;  Location: [facility] OR;  Service: Orthopedics;  Laterality: Right;  . I & D EXTREMITY Right 11/30/2012   Procedure: IRRIGATION AND DEBRIDEMENT Right hand Abscess;  Surgeon: Jodi Marble, MD;  Location: [facility] OR;  Service: Orthopedics;  Laterality: Right;  . TOTAL HIP ARTHROPLASTY Left 10/12/2014   Procedure: TOTAL HIP ARTHROPLASTY;  Surgeon: Nadara Mustard, MD;  Location: [facility];  Service: Orthopedics;  Laterality: Left;    There were no vitals filed for this visit.   Subjective Assessment - 03/16/20 1441    Subjective My hip is doing OK. Concerned about my walking. My back is the issue. It is the entire spine.    Limitations Standing;Walking    How long can you stand comfortably? not long    How long can you walk comfortably? not long    Patient Stated Goals Get hip better, improve walking ability    Currently in Pain? Yes    Pain Score 6     Pain Location Back    Pain Orientation Right;Left  Pain Descriptors / Indicators Aching;Sharp    Pain Type Chronic pain    Pain Onset More than a month ago    Pain Frequency Intermittent    Aggravating Factors  weightbearing, activity    Pain Relieving Factors layiing down    Multiple Pain Sites No                             [facility] Adult PT Treatment/Exercise - 03/16/20 0001      Ambulation/Gait   Gait Comments gait noted with L shoulder lower than right  with increased R hip internal rotation; pt has to come down slightly on left leg due to left leg being functionally shorter;discussed with pt Trendelenburg which was + L vs leg length difference      Knee/Hip Exercises: Standing   Other Standing Knee Exercises donkey kicks x 15      Knee/Hip Exercises: Supine   Other Supine Knee/Hip  Exercises tilt with heel slide x 10, tilt with SLR x 10; tilt with bridge x 15, tilt with clam shell x 15; tilt with bridge and clam shell x 15; lower trunk rotation 5x15 sec; tilt with ball squeeze x 20; tilt with ball squeeze with bridge x 20      Knee/Hip Exercises: Sidelying   Other Sidelying Knee/Hip Exercises sidelying hip abdct with knee flexed thru avail range of motion x 15 bil                       PT Long Term Goals - 03/16/20 1610      PT LONG TERM GOAL #1   Title Independent with HEP    Time 6    Period Weeks    Status On-going      PT LONG TERM GOAL #2   Title Improve FOTO outcome measure score to 57% or less impairment    Baseline 71% limited    Time 6    Period Weeks    Status On-going      PT LONG TERM GOAL #3   Title Increase left hip and knee strength at least grossly 1/2 MMT grade to improve gait mechanics for decreased bilat. hip pain and to improve ability for stair navigation and transfers from low seating    Time 6    Period Weeks    Status On-going      PT LONG TERM GOAL #4   Title Improve Romberg eyes closed on Airex at least 10 sec and SLS to 10 sec or greater bilat. for improved baalnce to decrease fall risk    Baseline unable Romberg, SLS x 4 sec left, 8 sec right    Time 6    Period Weeks    Status On-going      PT LONG TERM GOAL #5   Title Tolerate standing and ambulation for chores and shopping peridos at least 30 min without limitation due to hip pain    Time 6    Period Weeks    Status On-going      PT LONG TERM GOAL #6   Title Berg to be 53/56 for decreased falls risk    Time 5    Period Weeks    Status On-going                 Plan - 03/16/20 1506    PT Next Visit Plan Try 1 ply heel left in L shoe if pt  brings in closed toe shoe           Patient will benefit from skilled therapeutic intervention in order to improve the following deficits and impairments:  Abnormal gait, Difficulty walking, Hypomobility,  Decreased balance, Pain, Decreased strength, Decreased activity tolerance, Decreased range of motion  Visit Diagnosis: Pain in left hip  Stiffness of left hip, not elsewhere classified  Muscle weakness (generalized)  Difficulty in walking, not elsewhere classified     Problem List Patient Active Problem List   Diagnosis Date Noted  . 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 S/P repeat low transverse C-section 09/10/2013  . Cellulitis and abscess 12/22/2012  . Diabetes (HCC) 11/25/2012    Terius Jacuinde, PT 03/16/2020, 4:11 PM  [facility] [address] [place], [place], [address] Phone: [phone]   Fax:  [phone]  Name: [name] MRN: [id number] Date of Birth: 12/19/1975

## 2020-03-29 ENCOUNTER — Encounter: Payer: Self-pay | Admitting: Physical Therapy

## 2020-03-29 ENCOUNTER — Ambulatory Visit: Payer: Medicare Other | Admitting: Physical Therapy

## 2020-03-29 ENCOUNTER — Other Ambulatory Visit: Payer: Self-pay

## 2020-03-29 DIAGNOSIS — R262 Difficulty in walking, not elsewhere classified: Secondary | ICD-10-CM

## 2020-03-29 DIAGNOSIS — M25652 Stiffness of left hip, not elsewhere classified: Secondary | ICD-10-CM

## 2020-03-29 DIAGNOSIS — M25552 Pain in left hip: Secondary | ICD-10-CM

## 2020-03-29 DIAGNOSIS — M6281 Muscle weakness (generalized): Secondary | ICD-10-CM

## 2020-03-29 NOTE — Therapy (Signed)
[facility]patient Rehabilitation [facility] [address] [place], [place], [address] Phone: [phone]   Fax:  [phone]  Physical Therapy Treatment  Patient Details  Name: Aniylah Avans MRN: [id number] Date of Birth: 03-Mar-1976 Referring Provider (PT): [place] Persons, [place]   Encounter Date: 03/29/2020   PT End of Session - 03/29/20 1511    Visit Number 5    Number of Visits 12    Date for PT Re-Evaluation 04/04/20    Authorization Type MCR-progress note by visit 10    PT Start Time 1506    PT Stop Time 1545    PT Time Calculation (min) 39 min    Activity Tolerance Patient tolerated treatment well    Behavior During Therapy [facility] for tasks assessed/performed           Past Medical History:  Diagnosis Date  . Abscess 11/2012   RIGHT FINGER  . Arthritis   . Cataracts, bilateral   . Chronic back pain   . Diabetes mellitus    type 1  . Diabetic neuropathy (HCC)   . Herpes virus disease   . Hip fracture (HCC)    left side  . History of blood transfusion 2013   left  hip- pinning  . Shortness of breath dyspnea    with exertion  . Urinary tract infection    hx of    Past Surgical History:  Procedure Laterality Date  . BILATERAL SALPINGECTOMY    . CATARACT EXTRACTION     left  . CATARACT EXTRACTION W/PHACO  06/12/2012   Procedure: CATARACT EXTRACTION PHACO AND INTRAOCULAR LENS PLACEMENT (IOC);  Surgeon: [name], MD;  Location: [facility] OR;  Service: Ophthalmology;  Laterality: Right;  . CATARACT EXTRACTION W/PHACO  06/15/2012   Procedure: CATARACT EXTRACTION PHACO AND INTRAOCULAR LENS PLACEMENT (IOC);  Surgeon: [name], MD;  Location: [facility] OR;  Service: Ophthalmology;  Laterality: Left;  . CESAREAN SECTION     x 3  . CESAREAN SECTION WITH BILATERAL TUBAL LIGATION Bilateral 09/10/2013   Procedure: REPEAT CESAREAN SECTION WITH BILATERAL TUBAL LIGATION;  Surgeon: Purcell Nails, MD;  Location: [facility] ORS;  Service: Obstetrics;  Laterality:  Bilateral;  . EYE SURGERY Bilateral   . FRACTURE SURGERY     surgery left hip  . HAND SURGERY Right 11/2012  . I & D EXTREMITY Right 11/23/2012   Procedure: IRRIGATION AND DEBRIDEMENT EXTREMITY;  Surgeon: Jodi Marble, MD;  Location: [facility] OR;  Service: Orthopedics;  Laterality: Right;  . I & D EXTREMITY Right 11/30/2012   Procedure: IRRIGATION AND DEBRIDEMENT Right hand Abscess;  Surgeon: Jodi Marble, MD;  Location: [facility] OR;  Service: Orthopedics;  Laterality: Right;  . TOTAL HIP ARTHROPLASTY Left 10/12/2014   Procedure: TOTAL HIP ARTHROPLASTY;  Surgeon: Nadara Mustard, MD;  Location: [facility] OR;  Service: Orthopedics;  Laterality: Left;    There were no vitals filed for this visit.   Subjective Assessment - 03/29/20 1509    Subjective Left hip "so-so" with stiffness>pain. Pt. had injection for back last week and reports temporary ease with LBP symptoms. She arrives wearing flip flops today to unable to try heel lift this PM.    Pertinent History acetabular fracture with ORIF 2006, left posterior THA 2016, diabetic    Currently in Pain? No/denies                             [facility] Adult PT Treatment/Exercise - 03/29/20  0001      Knee/Hip Exercises: Stretches   Piriformis Stretch Left;2 reps;20 seconds      Knee/Hip Exercises: Aerobic   Nustep L4 x 5 min UE/LE      Knee/Hip Exercises: Standing   Lateral Step Up Left;2 sets;10 reps;Hand Hold: 2;Step Height: 4"    Forward Step Up Left;1 set;15 reps;Hand Hold: 1;Step Height: 6"    Functional Squat Limitations TRX partial squat 2x10    Rocker Board 2 minutes    Rocker Board Limitations dynamic balance x 1 min ea. lateral and fw/rev    Other Standing Knee Exercises hip hike with left foot on 4 in. step 2x10 cues to keep knees extended and avoid "march" on right    Other Standing Knee Exercises Romberg eyes closed 20 sec x 3 at counter on [employer]ire[employer]      Knee/Hip Exercises: Supine   Bridges with [employer] Pies Squeeze  AROM;Strengthening;15 reps    Other Supine Knee/Hip Exercises clamshell green band 2x10      Manual Therapy   Manual Therapy Soft tissue mobilization;Passive ROM    Soft tissue mobilization IASTM/roller left posterolateral hip region    Passive ROM left hip within posterior hip precautions                       PT Long Term Goals - 03/16/20 1610      PT LONG TERM GOAL #1   Title Independent with HEP    Time 6    Period Weeks    Status On-going      PT LONG TERM GOAL #2   Title Improve FOTO outcome measure score to 57% or less impairment    Baseline 71% limited    Time 6    Period Weeks    Status On-going      PT LONG TERM GOAL #3   Title Increase left hip and knee strength at least grossly 1/2 MMT grade to improve gait mechanics for decreased bilat. hip pain and to improve ability for stair navigation and transfers from low seating    Time 6    Period Weeks    Status On-going      PT LONG TERM GOAL #4   Title Improve Romberg eyes closed on Airex at least 10 sec and SLS to 10 sec or greater bilat. for improved baalnce to decrease fall risk    Baseline unable Romberg, SLS x 4 sec left, 8 sec right    Time 6    Period Weeks    Status On-going      PT LONG TERM GOAL #5   Title Tolerate standing and ambulation for chores and shopping peridos at least 30 min without limitation due to hip pain    Time 6    Period Weeks    Status On-going      PT LONG TERM GOAL #6   Title Berg to be 53/56 for decreased falls risk    Time 5    Period Weeks    Status On-going                 Plan - 03/29/20 1511    Clinical Impression Statement Unable to assess heel lift as pt. did not bring closed toe shoes but plan try next visit as suspect LLD could be contributing factor to back issues. Continues with some hip stiffness along with muscle weakness with fair progress thus far with therapy.    Personal Factors and Comorbidities Time  since onset of  injury/illness/exacerbation;Comorbidity 2    Comorbidities diabetic, obesity    Examination-Activity Limitations Squat;Lift;Stairs;Locomotion Level;Stand    Examination-Participation Restrictions Community Activity;Shop    Stability/Clinical Decision Making Evolving/Moderate complexity    Clinical Decision Making Moderate    Rehab Potential Good    PT Frequency 2x / week    PT Duration 6 weeks    PT Treatment/Interventions ADLs/Self Care Home Management;Cryotherapy;Electrical Stimulation;Moist Heat;Iontophoresis 4mg /ml Dexamethasone;Ultrasound;Gait training;Therapeutic exercise;Balance training;Stair training;Functional mobility training;Neuromuscular re-education;Therapeutic activities;Patient/family education;Manual techniques;Passive range of motion;Taping    PT Next Visit Plan Try 1 ply heel left in L shoe if pt brings in closed toe shoe, FOTO status next session    PT Home Exercise Plan [id number]: clamshell (issued red band), hip bridge, hip adduction isometric, hip abd SLR in standing, standing march, Romberg, SLS    Consulted and Agree with Plan of Care Patient           Patient will benefit from skilled therapeutic intervention in order to improve the following deficits and impairments:  Abnormal gait, Difficulty walking, Hypomobility, Decreased balance, Pain, Decreased strength, Decreased activity tolerance, Decreased range of motion  Visit Diagnosis: Pain in left hip  Stiffness of left hip, not elsewhere classified  Muscle weakness (generalized)  Difficulty in walking, not elsewhere classified     Problem List Patient Active Problem List   Diagnosis Date Noted  . 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 S/P repeat low transverse C-section 09/10/2013  . Cellulitis and abscess 12/22/2012  . Diabetes (HCC) 11/25/2012    01/25/2013, PT, DPT 03/29/20 4:07 PM  [facility] Outpatient Rehabilitation [facility] [address] [place], [place],  [place] Phone: [phone]   Fax:  [phone]  Name: Lurie Mullane MRN: [name] Date of Birth: Jul 27, 1976

## 2020-03-31 ENCOUNTER — Other Ambulatory Visit: Payer: Self-pay

## 2020-03-31 ENCOUNTER — Encounter: Payer: Self-pay | Admitting: Physical Therapy

## 2020-03-31 ENCOUNTER — Ambulatory Visit: Payer: Medicare Other | Admitting: Physical Therapy

## 2020-03-31 DIAGNOSIS — M6281 Muscle weakness (generalized): Secondary | ICD-10-CM

## 2020-03-31 DIAGNOSIS — M25652 Stiffness of left hip, not elsewhere classified: Secondary | ICD-10-CM

## 2020-03-31 DIAGNOSIS — R262 Difficulty in walking, not elsewhere classified: Secondary | ICD-10-CM

## 2020-03-31 DIAGNOSIS — M25552 Pain in left hip: Secondary | ICD-10-CM

## 2020-03-31 NOTE — Therapy (Signed)
[place], [place], [address] Phone: [phone]   Fax:  [phone]  Physical Therapy Treatment/Recertification Progress Note Reporting Period 02/22/2020 to 03/31/2020  See note below for Objective Data and Assessment of Progress/Goals.       Patient Details  Name: [name] MRN: [id number] Date of Birth: April 23, 1976 Referring Provider (PT): Bevely Palmer Persons, [place]   Encounter Date: 03/31/2020   PT End of Session - 03/31/20 1801    Visit Number 6    Number of Visits 17    Date for PT Re-Evaluation 05/12/20    Authorization Type MCR-next progress note by visit 16    PT Start Time 1148    PT Stop Time 1228    PT Time Calculation (min) 40 min    Activity Tolerance Patient tolerated treatment well    Behavior During Therapy [facility] for tasks assessed/performed           Past Medical History:  Diagnosis Date  . Abscess 11/2012   RIGHT FINGER  . Arthritis   . Cataracts, bilateral   . Chronic back pain   . Diabetes mellitus    type 1  . Diabetic neuropathy ([place])   . Herpes virus disease   . Hip fracture (HCC)    left side  . History of blood transfusion 2013   left  hip- pinning  . Shortness of breath dyspnea    with exertion  . Urinary tract infection    hx of    Past Surgical History:  Procedure Laterality Date  . BILATERAL SALPINGECTOMY    . CATARACT EXTRACTION     left  . CATARACT EXTRACTION W/PHACO  06/12/2012   Procedure: CATARACT EXTRACTION PHACO AND INTRAOCULAR LENS PLACEMENT (IOC);  Surgeon: Adonis Brook, MD;  Location: [place];  Service: Ophthalmology;  Laterality: Right;  . CATARACT EXTRACTION W/PHACO  06/15/2012   Procedure: CATARACT EXTRACTION PHACO AND INTRAOCULAR LENS PLACEMENT (IOC);  Surgeon: Adonis Brook, MD;  Location: [place];  Service: Ophthalmology;  Laterality: Left;  . CESAREAN SECTION     x 3  . CESAREAN SECTION WITH BILATERAL TUBAL LIGATION Bilateral 09/10/2013    Procedure: REPEAT CESAREAN SECTION WITH BILATERAL TUBAL LIGATION;  Surgeon: Delice Lesch, MD;  Location: [place] [facility];  Service: Obstetrics;  Laterality: Bilateral;  . EYE SURGERY Bilateral   . FRACTURE SURGERY     surgery left hip  . HAND SURGERY Right 11/2012  . I & D EXTREMITY Right 11/23/2012   Procedure: IRRIGATION AND DEBRIDEMENT EXTREMITY;  Surgeon: Jolyn Nap, MD;  Location: [place];  Service: Orthopedics;  Laterality: Right;  . I & D EXTREMITY Right 11/30/2012   Procedure: IRRIGATION AND DEBRIDEMENT Right hand Abscess;  Surgeon: Jolyn Nap, MD;  Location: [place];  Service: Orthopedics;  Laterality: Right;  . TOTAL HIP ARTHROPLASTY Left 10/12/2014   Procedure: TOTAL HIP ARTHROPLASTY;  Surgeon: Newt Minion, MD;  Location: [place];  Service: Orthopedics;  Laterality: Left;    There were no vitals filed for this visit.   Subjective Assessment - 03/31/20 1756    Subjective Pt. brought closed-toed shoes to wear today for trial heel lift for left LE to address LLD. No pain pre-tx. but still noting tightness in hip and some difficulty lifting left leg in hip flexion movements.    Pertinent History acetabular fracture with ORIF 2006, left posterior THA 2016, diabetic    Limitations Standing;Walking    Diagnostic tests X-rays  Patient Stated Goals Get hip better, improve walking ability    Currently in Pain? No/denies              [facility] PT Assessment - 03/31/20 0001      Observation/Other Assessments   Focus on Therapeutic Outcomes (FOTO)  63% limited      AROM   Left Hip Flexion 95    Left Hip External Rotation  35    Left Hip ABduction 40      Strength   Right Hip Flexion 4+/5    Right Hip External Rotation  5/5    Right Hip Internal Rotation 5/5    Left Hip Flexion 4/5    Left Hip External Rotation 4+/5    Left Hip Internal Rotation 4+/5    Left Hip ABduction 4/5    Right Knee Flexion 5/5    Right Knee Extension 5/5    Left Knee Flexion 5/5    Left Knee  Extension 4+/5                         [facility] Adult PT Treatment/Exercise - 03/31/20 0001      Ambulation/Gait   Gait Comments trial heel lift LLE with ambulation in clinic 100 feet to asses gait mechanics-pt. demos decreased tendency trunk lean/sway and improvement with Trendelenburg tendency with heel lift      Knee/Hip Exercises: Aerobic   Nustep L5 x 5 min UE/LE      Knee/Hip Exercises: Standing   Hip Abduction AROM;Stengthening;Right;Left;20 reps;Knee straight    Abduction Limitations 3 lbs.    Hip Extension AROM;Stengthening;Right;Left;2 sets;10 reps    Extension Limitations 3 lbs.    Lateral Step Up Left;2 sets;10 reps;Hand Hold: 2;Step Height: 4"    Forward Step Up Left;2 sets;10 reps    Functional Squat Limitations squat at counter 2x10      Knee/Hip Exercises: Supine   Bridges with Diona Foley Squeeze AROM;Strengthening;Both;20 reps    Straight Leg Raises AROM;Strengthening;Left;10 reps    Other Supine Knee/Hip Exercises clamshell blue band 2x10    Other Supine Knee/Hip Exercises supine marches with PPT x 15 reps      Manual Therapy   Passive ROM left hip within posterior hip precautions                  PT Education - 03/31/20 1800    Education Details Gait-"wean" into using heel lift with initial use 1-2 hours then increasing to full use over several days    Person(s) Educated Patient    Methods Explanation;Verbal cues    Comprehension Verbalized understanding               PT Long Term Goals - 03/31/20 1802      PT LONG TERM GOAL #1   Title Independent with HEP    Baseline met for initial HEP, will update prn    Time 6    Period Weeks    Status Achieved      PT LONG TERM GOAL #2   Title Improve FOTO outcome measure score to 57% or less impairment    Baseline 63% limited    Time 6    Period Weeks    Status On-going    Target Date 05/12/20      PT LONG TERM GOAL #3   Title Increase left hip and knee strength at least grossly 1/2  MMT grade to improve gait mechanics for decreased bilat. hip pain and to  improve ability for stair navigation and transfers from low seating    Baseline see objective    Time 6    Period Weeks    Status Partially Met    Target Date 05/12/20      PT LONG TERM GOAL #4   Title Improve Romberg eyes closed on Airex at least 10 sec and SLS to 10 sec or greater bilat. for improved baalnce to decrease fall risk    Baseline ongoing    Time 6    Period Weeks    Status On-going    Target Date 05/12/20      PT LONG TERM GOAL #5   Title Tolerate standing and ambulation for chores and shopping peridos at least 30 min without limitation due to hip pain    Baseline ongoing    Time 6    Period Weeks    Status On-going    Target Date 05/12/20      PT LONG TERM GOAL #6   Title Berg to be 53/56 for decreased falls risk    Baseline 49/56    Time 6    Period Weeks    Status On-going    Target Date 05/12/20                 Plan - 03/31/20 1804    Clinical Impression Statement Pt. presents for 6th therapy session today. She continues with high evel of reported functional limitation per FOTO report but has improved 8% from baseline status. Gradually improving with strength gains from baseline status but still with some stiffness limiting hip ROM. She continues with balance/proprioceptive difficulties with mild improvement with this from baseline. Provided with heel lift today to help address LLD which suspect has been contributing factor along with antalgia and hip weakness to recent associated LBP. Plan continue PT for further progress to address remaining functional limitations for mobility.    Personal Factors and Comorbidities Time since onset of injury/illness/exacerbation;Comorbidity 2    Comorbidities diabetic, obesity    Examination-Activity Limitations Squat;Lift;Stairs;Locomotion Level;Stand    Examination-Participation Restrictions Community Activity;Shop    Stability/Clinical  Decision Making Evolving/Moderate complexity    Clinical Decision Making Moderate    Rehab Potential Good    PT Frequency 2x / week    PT Duration 6 weeks    PT Treatment/Interventions ADLs/Self Care Home Management;Cryotherapy;Electrical Stimulation;Moist Heat;Iontophoresis 27m/ml Dexamethasone;Ultrasound;Gait training;Therapeutic exercise;Balance training;Stair training;Functional mobility training;Neuromuscular re-education;Therapeutic activities;Patient/family education;Manual techniques;Passive range of motion;Taping    PT Next Visit Plan continue hip strengthening, AROM/PROM (caution excessive hip flexion or IR/add due to history posterior THA), balance/proprioceptive challenges    PT Home Exercise Plan [id number]: clamshell (issued red band), hip bridge, hip adduction isometric, hip abd SLR in standing, standing march, Romberg, SLS    Consulted and Agree with Plan of Care Patient           Patient will benefit from skilled therapeutic intervention in order to improve the following deficits and impairments:  Abnormal gait, Difficulty walking, Hypomobility, Decreased balance, Pain, Decreased strength, Decreased activity tolerance, Decreased range of motion  Visit Diagnosis: Pain in left hip  Stiffness of left hip, not elsewhere classified  Muscle weakness (generalized)  Difficulty in walking, not elsewhere classified     Problem List Patient Active Problem List   Diagnosis Date Noted  . 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 S/P repeat low transverse C-section 09/10/2013  . Cellulitis and abscess 12/22/2012  . [place] 11/25/2012    CBeaulah Dinning PT, DPT  03/31/20 6:11 PM  [place], [place], [address] Phone: [phone]   Fax:  [phone]  Name: [name] MRN: [id number] Date of Birth: 1976/02/04

## 2020-04-06 ENCOUNTER — Ambulatory Visit: Payer: Medicare Other | Admitting: Rehabilitative and Restorative Service Providers"

## 2020-04-07 ENCOUNTER — Ambulatory Visit: Payer: Medicare Other | Admitting: Physical Therapy

## 2020-04-07 ENCOUNTER — Other Ambulatory Visit: Payer: Self-pay

## 2020-04-07 ENCOUNTER — Encounter: Payer: Self-pay | Admitting: Physical Therapy

## 2020-04-07 DIAGNOSIS — M25552 Pain in left hip: Secondary | ICD-10-CM | POA: Diagnosis not present

## 2020-04-07 DIAGNOSIS — R262 Difficulty in walking, not elsewhere classified: Secondary | ICD-10-CM

## 2020-04-07 DIAGNOSIS — M25652 Stiffness of left hip, not elsewhere classified: Secondary | ICD-10-CM

## 2020-04-07 DIAGNOSIS — M6281 Muscle weakness (generalized): Secondary | ICD-10-CM

## 2020-04-07 NOTE — Therapy (Signed)
[place], [place], [address] Phone: [phone]   Fax:  [phone]  Physical Therapy Treatment  Patient Details  Name: [name] MRN: [id number] Date of Birth: May 16, 1976 Referring Provider (PT): Bevely Palmer Persons, [place]   Encounter Date: 04/07/2020   PT End of Session - 04/07/20 0856    Visit Number 7    Number of Visits 17    Date for PT Re-Evaluation 05/12/20    Authorization Type MCR-next progress note by visit 77    PT Start Time 0854   pt. arrived late   PT Stop Time 0930    PT Time Calculation (min) 36 min    Activity Tolerance Patient tolerated treatment well    Behavior During Therapy WFL for tasks assessed/performed           Past Medical History:  Diagnosis Date  . Abscess 11/2012   RIGHT FINGER  . Arthritis   . Cataracts, bilateral   . Chronic back pain   . Diabetes mellitus    type 1  . Diabetic neuropathy ([place])   . Herpes virus disease   . Hip fracture (HCC)    left side  . History of blood transfusion 2013   left  hip- pinning  . Shortness of breath dyspnea    with exertion  . Urinary tract infection    hx of    Past Surgical History:  Procedure Laterality Date  . BILATERAL SALPINGECTOMY    . CATARACT EXTRACTION     left  . CATARACT EXTRACTION W/PHACO  06/12/2012   Procedure: CATARACT EXTRACTION PHACO AND INTRAOCULAR LENS PLACEMENT (IOC);  Surgeon: Adonis Brook, MD;  Location: [facility];  Service: Ophthalmology;  Laterality: Right;  . CATARACT EXTRACTION W/PHACO  06/15/2012   Procedure: CATARACT EXTRACTION PHACO AND INTRAOCULAR LENS PLACEMENT (IOC);  Surgeon: Adonis Brook, MD;  Location: [facility];  Service: Ophthalmology;  Laterality: Left;  . CESAREAN SECTION     x 3  . CESAREAN SECTION WITH BILATERAL TUBAL LIGATION Bilateral 09/10/2013   Procedure: REPEAT CESAREAN SECTION WITH BILATERAL TUBAL LIGATION;  Surgeon: Delice Lesch, MD;  Location: [facility];  Service:  Obstetrics;  Laterality: Bilateral;  . EYE SURGERY Bilateral   . FRACTURE SURGERY     surgery left hip  . HAND SURGERY Right 11/2012  . I & D EXTREMITY Right 11/23/2012   Procedure: IRRIGATION AND DEBRIDEMENT EXTREMITY;  Surgeon: Jolyn Nap, MD;  Location: [facility];  Service: Orthopedics;  Laterality: Right;  . I & D EXTREMITY Right 11/30/2012   Procedure: IRRIGATION AND DEBRIDEMENT Right hand Abscess;  Surgeon: Jolyn Nap, MD;  Location: [facility];  Service: Orthopedics;  Laterality: Right;  . TOTAL HIP ARTHROPLASTY Left 10/12/2014   Procedure: TOTAL HIP ARTHROPLASTY;  Surgeon: Newt Minion, MD;  Location: [facility];  Service: Orthopedics;  Laterality: Left;    There were no vitals filed for this visit.   Subjective Assessment - 04/07/20 0855    Subjective No pain pre-tx. Pt. reports has been using heel lift while at work for 4 hour shifts and still getting used to wearing/no significant improvement yet.    Pertinent History acetabular fracture with ORIF 2006, left posterior THA 2016, diabetic    Currently in Pain? No/denies                             [facility] Adult PT Treatment/Exercise - 04/07/20 0001  Knee/Hip Exercises: Stretches   Passive Hamstring Stretch Left;3 reps;30 seconds    Hip Flexor Stretch Left;3 reps;30 seconds    Piriformis Stretch Left;3 reps;30 seconds      Knee/Hip Exercises: Aerobic   Nustep L5 x 5 min UE/LE      Knee/Hip Exercises: Standing   Hip Abduction AROM;Stengthening;Right;Left;20 reps;Knee straight    Abduction Limitations 3 lbs.    Hip Extension AROM;Stengthening;Right;Left;2 sets;10 reps    Extension Limitations 3 lbs.    Lateral Step Up Left;2 sets;10 reps;Hand Hold: 2;Step Height: 4"    Forward Step Up Left;2 sets;10 reps;Hand Hold: 1;Step Height: 6"    Forward Step Up Limitations with opp hip hike    Functional Squat Limitations TRX squat 2x10    Rocker Board 1 minute    Rocker Board Limitations dynamic lateral balance       Knee/Hip Exercises: Supine   Bridges with Ball Squeeze AROM;Strengthening;Both;20 reps    Other Supine Knee/Hip Exercises clamshell blue band 2x10    Other Supine Knee/Hip Exercises supine marches with PPT x 15 reps      Manual Therapy   Passive ROM left hip within posterior hip precautions                       PT Long Term Goals - 03/31/20 1802      PT LONG TERM GOAL #1   Title Independent with HEP    Baseline met for initial HEP, will update prn    Time 6    Period Weeks    Status Achieved      PT LONG TERM GOAL #2   Title Improve FOTO outcome measure score to 57% or less impairment    Baseline 63% limited    Time 6    Period Weeks    Status On-going    Target Date 05/12/20      PT LONG TERM GOAL #3   Title Increase left hip and knee strength at least grossly 1/2 MMT grade to improve gait mechanics for decreased bilat. hip pain and to improve ability for stair navigation and transfers from low seating    Baseline see objective    Time 6    Period Weeks    Status Partially Met    Target Date 05/12/20      PT LONG TERM GOAL #4   Title Improve Romberg eyes closed on Airex at least 10 sec and SLS to 10 sec or greater bilat. for improved baalnce to decrease fall risk    Baseline ongoing    Time 6    Period Weeks    Status On-going    Target Date 05/12/20      PT LONG TERM GOAL #5   Title Tolerate standing and ambulation for chores and shopping peridos at least 30 min without limitation due to hip pain    Baseline ongoing    Time 6    Period Weeks    Status On-going    Target Date 05/12/20      PT LONG TERM GOAL #6   Title Berg to be 53/56 for decreased falls risk    Baseline 49/56    Time 6    Period Weeks    Status On-going    Target Date 05/12/20                 Plan - 04/07/20 1497    Clinical Impression Statement Uncertain impact at this point of heel lift  but will await further response with continued use. Otherwise tx. focus  hip ROM/stretches and continued strengthening progression all with good tolerance but fair/gradual progress overall given underlying hip stiffness and timeframe since surgery.    Personal Factors and Comorbidities Time since onset of injury/illness/exacerbation;Comorbidity 2    Comorbidities diabetic, obesity    Examination-Activity Limitations Squat;Lift;Stairs;Locomotion Level;Stand    Examination-Participation Restrictions Community Activity;Shop    Stability/Clinical Decision Making Evolving/Moderate complexity    Clinical Decision Making Moderate    Rehab Potential Good    PT Frequency 2x / week    PT Duration 6 weeks    PT Treatment/Interventions ADLs/Self Care Home Management;Cryotherapy;Electrical Stimulation;Moist Heat;Iontophoresis 78m/ml Dexamethasone;Ultrasound;Gait training;Therapeutic exercise;Balance training;Stair training;Functional mobility training;Neuromuscular re-education;Therapeutic activities;Patient/family education;Manual techniques;Passive range of motion;Taping    PT Next Visit Plan continue hip strengthening, AROM/PROM (caution excessive hip flexion or IR/add due to history posterior THA), balance/proprioceptive challenges    PT Home Exercise Plan [id number]: clamshell (issued red band), hip bridge, hip adduction isometric, hip abd SLR in standing, standing march, Romberg, SLS    Consulted and Agree with Plan of Care Patient           Patient will benefit from skilled therapeutic intervention in order to improve the following deficits and impairments:  Abnormal gait, Difficulty walking, Hypomobility, Decreased balance, Pain, Decreased strength, Decreased activity tolerance, Decreased range of motion  Visit Diagnosis: Pain in left hip  Stiffness of left hip, not elsewhere classified  Muscle weakness (generalized)  Difficulty in walking, not elsewhere classified     Problem List Patient Active Problem List   Diagnosis Date Noted  . S/P total hip  arthroplasty 10/12/2014  . S/P repeat low transverse C-section 09/10/2013  . Cellulitis and abscess 12/22/2012  . [place] 11/25/2012    CBeaulah Dinning PT, DPT 04/07/20 9:32 AM  [facility] [address]Phone: [phone]  Fax:  [phone] Name: Erika AbdouMRN: [id number] of Birth: [birth year]

## 2020-04-10 ENCOUNTER — Ambulatory Visit: Payer: Medicare Other | Admitting: Physical Therapy

## 2020-04-19 ENCOUNTER — Encounter: Payer: Self-pay | Admitting: Rehabilitative and Restorative Service Providers"

## 2020-04-19 ENCOUNTER — Other Ambulatory Visit: Payer: Self-pay

## 2020-04-19 ENCOUNTER — Ambulatory Visit: Payer: Medicare Other | Attending: Physician Assistant | Admitting: Rehabilitative and Restorative Service Providers"

## 2020-04-19 DIAGNOSIS — M6281 Muscle weakness (generalized): Secondary | ICD-10-CM

## 2020-04-19 DIAGNOSIS — M25552 Pain in left hip: Secondary | ICD-10-CM

## 2020-04-19 DIAGNOSIS — M25652 Stiffness of left hip, not elsewhere classified: Secondary | ICD-10-CM

## 2020-04-19 DIAGNOSIS — R262 Difficulty in walking, not elsewhere classified: Secondary | ICD-10-CM | POA: Diagnosis present

## 2020-04-19 NOTE — Therapy (Signed)
[place], [place], [address] Phone: [phone]   Fax:  [phone]  Physical Therapy Treatment  Patient Details  Name: [name] MRN: [id number] Date of Birth: [birth year] Referring Provider (PT): Bevely Palmer Persons, [place]   Encounter Date: 04/19/2020   PT End of Session - 04/19/20 1549    Visit Number 8    Number of Visits 17    Date for PT Re-Evaluation 05/12/20    Authorization Type MCR-next progress note by visit 16    PT Start Time 0339    PT Stop Time 0419    PT Time Calculation (min) 40 min    Activity Tolerance Patient tolerated treatment well;No increased pain    Behavior During Therapy WFL for tasks assessed/performed           Past Medical History:  Diagnosis Date  . Abscess 11/2012   RIGHT FINGER  . Arthritis   . Cataracts, bilateral   . Chronic back pain   . Diabetes mellitus    type 1  . Diabetic neuropathy ([place])   . Herpes virus disease   . Hip fracture (HCC)    left side  . History of blood transfusion 2013   left  hip- pinning  . Shortness of breath dyspnea    with exertion  . Urinary tract infection    hx of    Past Surgical History:  Procedure Laterality Date  . BILATERAL SALPINGECTOMY    . CATARACT EXTRACTION     left  . CATARACT EXTRACTION W/PHACO  06/12/2012   Procedure: CATARACT EXTRACTION PHACO AND INTRAOCULAR LENS PLACEMENT (IOC);  Surgeon: Adonis Brook, MD;  Location: [facility];  Service: Ophthalmology;  Laterality: Right;  . CATARACT EXTRACTION W/PHACO  06/15/2012   Procedure: CATARACT EXTRACTION PHACO AND INTRAOCULAR LENS PLACEMENT (IOC);  Surgeon: Adonis Brook, MD;  Location: [facility];  Service: Ophthalmology;  Laterality: Left;  . CESAREAN SECTION     x 3  . CESAREAN SECTION WITH BILATERAL TUBAL LIGATION Bilateral 09/10/2013   Procedure: REPEAT CESAREAN SECTION WITH BILATERAL TUBAL LIGATION;  Surgeon: Delice Lesch, MD;  Location: [facility];  Service:  Obstetrics;  Laterality: Bilateral;  . EYE SURGERY Bilateral   . FRACTURE SURGERY     surgery left hip  . HAND SURGERY Right 11/2012  . I & D EXTREMITY Right 11/23/2012   Procedure: IRRIGATION AND DEBRIDEMENT EXTREMITY;  Surgeon: Jolyn Nap, MD;  Location: [facility];  Service: Orthopedics;  Laterality: Right;  . I & D EXTREMITY Right 11/30/2012   Procedure: IRRIGATION AND DEBRIDEMENT Right hand Abscess;  Surgeon: Jolyn Nap, MD;  Location: [facility];  Service: Orthopedics;  Laterality: Right;  . TOTAL HIP ARTHROPLASTY Left 10/12/2014   Procedure: TOTAL HIP ARTHROPLASTY;  Surgeon: Newt Minion, MD;  Location: [facility];  Service: Orthopedics;  Laterality: Left;    There were no vitals filed for this visit.   Subjective Assessment - 04/19/20 1546    Subjective PT 10 min late. Better overall. It has been my back the whole time but I wonder if the rod has slipped in the hip. PT recommended followup back with MD for reassessment. She states she lift has helped her balance.    Currently in Pain? No/denies    Multiple Pain Sites No                             [facility] Adult PT  Treatment/Exercise - 04/19/20 0001      Knee/Hip Exercises: Standing   Other Standing Knee Exercises 6 inch frontal/lateral step ups x 15; standing lumbar forward lean holding onto a sturdy object 2x30 sec; back forward hinge with glute set to neutral (mini deadlift motion) x 15; standing donkey kick x 15 unilat bil; unilat Lat stretch holding a sturdy object x 15 sec      Knee/Hip Exercises: Supine   Other Supine Knee/Hip Exercises bridge x 20; green 5 lb weighted ball squeeze with tilt x 20; same ball squeeze with bridge x 20; tilt with SLR x 20 with PT manual assist to keep L LE in neutral instead of ER x 20; L hip ER to neutral AROM x 20      Knee/Hip Exercises: Sidelying   Other Sidelying Knee/Hip Exercises R sidelying L clamshell x 25 with and without towel roll between fee5                        PT Long Term Goals - 04/19/20 1606      PT LONG TERM GOAL #1   Title Independent with HEP    Status Achieved      PT LONG TERM GOAL #2   Title Improve FOTO outcome measure score to 57% or less impairment    Time 6    Period Weeks    Status On-going      PT LONG TERM GOAL #3   Title Increase left hip and knee strength at least grossly 1/2 MMT grade to improve gait mechanics for decreased bilat. hip pain and to improve ability for stair navigation and transfers from low seating    Time 6    Period Weeks    Status Partially Met      PT LONG TERM GOAL #4   Title Improve Romberg eyes closed on Airex at least 10 sec and SLS to 10 sec or greater bilat. for improved baalnce to decrease fall risk    Time 6    Period Weeks    Status On-going      PT LONG TERM GOAL #5   Title Tolerate standing and ambulation for chores and shopping peridos at least 30 min without limitation due to hip pain    Time 6    Period Weeks    Status On-going      PT LONG TERM GOAL #6   Title Berg to be 53/56 for decreased falls risk    Time 6    Period Weeks    Status On-going                 Plan - 04/19/20 1552    Clinical Impression Statement Pt reports heel lift is helping balance and she let her MD know. Reports pain overall has improved but still present in the L low back. Pt rests L LE into ER and has difficulty maintaining L foot in neutral with ER and needs manual assist to maintain it in neutral.Recommended pt return to MD for further back/hip assessment. Pt would benefit from further PT for lumbar/hip strengthening and flexibility with emphasis on L and L LBP management PRN. All TE performed bil    Rehab Potential Good    PT Frequency 2x / week    PT Duration 6 weeks    PT Treatment/Interventions ADLs/Self Care Home Management;Cryotherapy;Electrical Stimulation;Moist Heat;Iontophoresis 55m/ml Dexamethasone;Ultrasound;Gait training;Therapeutic exercise;Balance  training;Stair training;Functional mobility training;Neuromuscular re-education;Therapeutic activities;Patient/family education;Manual techniques;Passive range of motion;Taping  PT Next Visit Plan continue hip strengthening, AROM/PROM (caution excessive hip flexion or IR/add due to history posterior THA), balance/proprioceptive challenges    Consulted and Agree with Plan of Care Patient           Patient will benefit from skilled therapeutic intervention in order to improve the following deficits and impairments:  Abnormal gait, Difficulty walking, Hypomobility, Decreased balance, Pain, Decreased strength, Decreased activity tolerance, Decreased range of motion  Visit Diagnosis: Stiffness of left hip, not elsewhere classified  Muscle weakness (generalized)  Pain in left hip  Difficulty in walking, not elsewhere classified     Problem List Patient Active Problem List   Diagnosis Date Noted  . 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 S/P repeat low transverse C-section 09/10/2013  . Cellulitis and abscess 12/22/2012  . Diabetes ([place]) 11/25/2012    Omni Dunsworth, PT 04/19/2020, 4:22 PM  [facility] [address] [place], [place], [address] Phone: [phone]   Fax:  [phone]  Name: Tuana Hoheisel MRN: [id number] Date of Birth: [birth year]

## 2020-06-15 NOTE — Therapy (Signed)
[place], [place], [address] Phone: [phone]   Fax:  [phone]  Physical Therapy Treatment/Discharge  Patient Details  Name: [name] MRN: [id number] Date of Birth: 1976/03/26 Referring Provider (PT): Bevely Palmer Persons, [place]   Encounter Date: 04/19/2020    Past Medical History:  Diagnosis Date  . Abscess 11/2012   RIGHT FINGER  . Arthritis   . Cataracts, bilateral   . Chronic back pain   . Diabetes mellitus    type 1  . Diabetic neuropathy ([place])   . Herpes virus disease   . Hip fracture (HCC)    left side  . History of blood transfusion 2013   left  hip- pinning  . Shortness of breath dyspnea    with exertion  . Urinary tract infection    hx of    Past Surgical History:  Procedure Laterality Date  . BILATERAL SALPINGECTOMY    . CATARACT EXTRACTION     left  . CATARACT EXTRACTION W/PHACO  06/12/2012   Procedure: CATARACT EXTRACTION PHACO AND INTRAOCULAR LENS PLACEMENT (IOC);  Surgeon: Adonis Brook, MD;  Location: [facility];  Service: Ophthalmology;  Laterality: Right;  . CATARACT EXTRACTION W/PHACO  06/15/2012   Procedure: CATARACT EXTRACTION PHACO AND INTRAOCULAR LENS PLACEMENT (IOC);  Surgeon: Adonis Brook, MD;  Location: [facility];  Service: Ophthalmology;  Laterality: Left;  . CESAREAN SECTION     x 3  . CESAREAN SECTION WITH BILATERAL TUBAL LIGATION Bilateral 09/10/2013   Procedure: REPEAT CESAREAN SECTION WITH BILATERAL TUBAL LIGATION;  Surgeon: Delice Lesch, MD;  Location: [facility];  Service: Obstetrics;  Laterality: Bilateral;  . EYE SURGERY Bilateral   . FRACTURE SURGERY     surgery left hip  . HAND SURGERY Right 11/2012  . I & D EXTREMITY Right 11/23/2012   Procedure: IRRIGATION AND DEBRIDEMENT EXTREMITY;  Surgeon: Jolyn Nap, MD;  Location: [facility];  Service: Orthopedics;  Laterality: Right;  . I & D EXTREMITY Right 11/30/2012   Procedure: IRRIGATION AND DEBRIDEMENT Right  hand Abscess;  Surgeon: Jolyn Nap, MD;  Location: [facility];  Service: Orthopedics;  Laterality: Right;  . TOTAL HIP ARTHROPLASTY Left 10/12/2014   Procedure: TOTAL HIP ARTHROPLASTY;  Surgeon: Newt Minion, MD;  Location: [facility];  Service: Orthopedics;  Laterality: Left;    There were no vitals filed for this visit.                                   PT Long Term Goals - 04/19/20 1606      PT LONG TERM GOAL #1   Title Independent with HEP    Status Achieved      PT LONG TERM GOAL #2   Title Improve FOTO outcome measure score to 57% or less impairment    Time 6    Period Weeks    Status On-going      PT LONG TERM GOAL #3   Title Increase left hip and knee strength at least grossly 1/2 MMT grade to improve gait mechanics for decreased bilat. hip pain and to improve ability for stair navigation and transfers from low seating    Time 6    Period Weeks    Status Partially Met      PT LONG TERM GOAL #4   Title Improve Romberg eyes closed on Airex at least 10 sec and SLS to 10  sec or greater bilat. for improved baalnce to decrease fall risk    Time 6    Period Weeks    Status On-going      PT LONG TERM GOAL #5   Title Tolerate standing and ambulation for chores and shopping peridos at least 30 min without limitation due to hip pain    Time 6    Period Weeks    Status On-going      PT LONG TERM GOAL #6   Title Berg to be 53/56 for decreased falls risk    Time 6    Period Weeks    Status On-going                  Patient will benefit from skilled therapeutic intervention in order to improve the following deficits and impairments:  Abnormal gait, Difficulty walking, Hypomobility, Decreased balance, Pain, Decreased strength, Decreased activity tolerance, Decreased range of motion  Visit Diagnosis: Stiffness of left hip, not elsewhere classified  Muscle weakness (generalized)  Pain in left hip  Difficulty in walking, not elsewhere  classified     Problem List Patient Active Problem List   Diagnosis Date Noted  . 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 S/P repeat low transverse C-section 09/10/2013  . Cellulitis and abscess 12/22/2012  . Diabetes ([place]) 11/25/2012         PHYSICAL THERAPY DISCHARGE SUMMARY  Visits from Start of Care: 8  Current functional level related to goals / functional outcomes: Patient did not return for further therapy after last session 04/19/20. No further Pt visits scheduled/planned at this time.  Remaining deficits: Current status unknown   Education / Equipment: HEP  Plan: Patient agrees to discharge.  Patient goals were not met. Patient is being discharged due to not returning since the last visit.  ?????           Beaulah Dinning, PT, DPT 06/15/20 10:05 AM       [facility] [address] [place], [place], [address] Phone: [phone]   Fax:  [phone]  Name: [name] MRN: [id number] Date of Birth: 08/19/1976

## 2020-07-28 ENCOUNTER — Other Ambulatory Visit: Payer: Self-pay | Admitting: Surgery

## 2020-07-28 ENCOUNTER — Other Ambulatory Visit (HOSPITAL_COMMUNITY): Payer: Self-pay | Admitting: Surgery

## 2020-08-14 ENCOUNTER — Ambulatory Visit (HOSPITAL_COMMUNITY): Admission: RE | Admit: 2020-08-14 | Payer: Medicare Other | Source: Ambulatory Visit

## 2020-08-14 ENCOUNTER — Encounter (HOSPITAL_COMMUNITY): Payer: Self-pay

## 2020-08-14 ENCOUNTER — Ambulatory Visit (HOSPITAL_COMMUNITY): Payer: Medicare Other

## 2020-09-04 ENCOUNTER — Encounter: Payer: Medicare Other | Attending: Surgery | Admitting: Skilled Nursing Facility1

## 2020-09-04 ENCOUNTER — Other Ambulatory Visit: Payer: Self-pay

## 2020-09-04 DIAGNOSIS — E1069 Type 1 diabetes mellitus with other specified complication: Secondary | ICD-10-CM | POA: Diagnosis not present

## 2020-09-04 DIAGNOSIS — E669 Obesity, unspecified: Secondary | ICD-10-CM | POA: Diagnosis present

## 2020-09-04 NOTE — Progress Notes (Signed)
Nutrition Assessment for Bariatric Surgery Medical Nutrition Therapy Appt Start Time:  10:05 End Time: 10:45  Patient was seen on 09/04/2020 for Pre-Operative Nutrition Assessment. Letter of approval faxed to [facility] bariatric surgery program coordinator on 09/04/2020  Referral stated Supervised Weight Loss (SWL) visits needed: 0: pt will need at least 1 more appt to help her with making changes and educate on pre-op goals.    Planned surgery: sleeve gastrectomy  Pt expectation of surgery: to lose weight and feel better Pt expectation of dietitian: none stated  Pt arrived 20 minutes late   NUTRITION ASSESSMENT   Anthropometrics  Start weight at [facility]: 253.5 lbs (date: 09/04/2020)  Height: 62 in BMI: 46.37 kg/m2     Clinical  Medical hx: type 1 diabetes, retinopathy, neuropathy  Medications: insulins Labs:  Notable signs/symptoms: back pain, leg pain, bending down resulting in breath loss Any previous deficiencies? Vitamin D  Micronutrient Nutrition Focused Physical Exam: Hair: No issues observed Eyes: No issues observed Mouth: No issues observed Neck: No issues observed Nails: No issues observed Skin: No issues observed  Lifestyle & Dietary Hx  Pt states she has had type 1 diabetes for many decades. Pt states her A1C is 7.5 stating she checks her blood sugar every morning fasting: 98-120. Pt states she does have a low blood sugar in the night when she does not eat dinner: fatigued and struggling to get out of bed correcting with peanut butter (dietitian educated pt on wanting some fast acting sugar for a low blood sugar then pt stated she also eats+ graham crackers and orange juice statng she knows what she is doing she has had diabetes for 44 years).   Pt states she has eaten in the middle of the night after going to bed.  24-Hr Dietary Recall First Meal: 2 sausages  Or skipped Snack: fruit Second Meal: microwaved meal Snack: chips Third Meal:  skipped Snack:  Beverages: water, juice, gingerale, 2% milk    Estimated Energy Needs Calories: 1500  NUTRITION DIAGNOSIS  Overweight/obesity ([place]-3.3) related to past poor dietary habits and physical inactivity as evidenced by patient w/ planned sleeve gastrectomy surgery following dietary guidelines for continued weight loss.    NUTRITION INTERVENTION  Nutrition counseling (C-1) and education (E-2) to facilitate bariatric surgery goals.   Pre-Op Goals Reviewed with the Patient: Due to pt arriving late this was not done will conduct education at next visit . Track food and beverage intake (pen and paper, MyFitness Pal, Baritastic app, etc.) . Make healthy food choices while monitoring portion sizes . Consume 3 meals per day or try to eat every 3-5 hours . Avoid concentrated sugars and fried foods . Keep sugar & fat in the single digits per serving on food labels . Practice CHEWING your food (aim for applesauce consistency) . Practice not drinking 15 minutes before, during, and 30 minutes after each meal and snack . Avoid all carbonated beverages (ex: soda, sparkling beverages)  . Limit caffeinated beverages (ex: coffee, tea, energy drinks) . Avoid all sugar-sweetened beverages (ex: regular soda, sports drinks)  . Avoid alcohol  . Aim for 64-100 ounces of FLUID daily (with at least half of fluid intake being plain water)  . Aim for at least 60-80 grams of PROTEIN daily . Look for a liquid protein source that contains ?15 g protein and ?5 g carbohydrate (ex: shakes, drinks, shots) . Make a list of non-food related activities . Physical activity is an important part of a healthy lifestyle  so keep it moving! The goal is to reach 150 minutes of exercise per week, including cardiovascular and weight baring activity.  *Goals that are bolded indicate the pt would like to start working towards these  Handouts Provided Include  . Bariatric Surgery handouts (Nutrition Visits, Pre-Op Goals,  Protein Shakes, Vitamins & Minerals)  Learning Style & Readiness for Change Teaching method utilized: Visual & Auditory  Demonstrated degree of understanding via: Teach Back  Readiness Level: pre contemplative  Barriers to learning/adherence to lifestyle change: unidentified at this time  RD's Notes for Next Visit . Assess pts adherence to chosen goals     MONITORING & EVALUATION Dietary intake, weekly physical activity, body weight, and pre-op goals reached at next nutrition visit.    Next Steps  Patient is to follow up at [facility] for Pre-Op Class >2 weeks before surgery for further nutrition education.

## 2021-04-08 ENCOUNTER — Other Ambulatory Visit: Payer: Self-pay

## 2021-04-08 ENCOUNTER — Ambulatory Visit
Admission: EM | Admit: 2021-04-08 | Discharge: 2021-04-08 | Disposition: A | Discharge status: Home / Self Care | Payer: Medicare Other | Attending: Urgent Care | Admitting: Urgent Care

## 2021-04-08 ENCOUNTER — Encounter: Payer: Self-pay | Admitting: Emergency Medicine

## 2021-04-08 DIAGNOSIS — E109 Type 1 diabetes mellitus without complications: Secondary | ICD-10-CM

## 2021-04-08 DIAGNOSIS — J988 Other specified respiratory disorders: Secondary | ICD-10-CM | POA: Diagnosis not present

## 2021-04-08 DIAGNOSIS — Z20822 Contact with and (suspected) exposure to covid-19: Secondary | ICD-10-CM

## 2021-04-08 DIAGNOSIS — B9789 Other viral agents as the cause of diseases classified elsewhere: Secondary | ICD-10-CM | POA: Diagnosis not present

## 2021-04-08 MED ORDER — CETIRIZINE HCL 10 MG PO TABS: 10.0000 mg | ORAL_TABLET | Freq: Every day | ORAL | 0 refills | Status: AC

## 2021-04-08 MED ORDER — BENZONATATE 100 MG PO CAPS: 100.0000 mg | ORAL_CAPSULE | Freq: Three times a day (TID) | ORAL | 0 refills | Status: AC | PRN

## 2021-04-08 MED ORDER — PSEUDOEPHEDRINE HCL 60 MG PO TABS: 60.0000 mg | ORAL_TABLET | Freq: Three times a day (TID) | ORAL | 0 refills | Status: AC | PRN

## 2021-04-08 NOTE — ED Provider Notes (Addendum)
[facility]   MRN: [id number] DOB: 07-Feb-1976  Subjective:   [name] is a 45 y.o. female presenting for 3-day history of acute onset sinus congestion, cough.  Has also had chest pain with her cough but no shortness of breath.  No fever, body aches.  No history of PE, respiratory disorders such as asthma, COPD.  Patient is a diabetic patient, type I and is on insulin.  She has her COVID vaccination.  No current facility-administered medications for this encounter.  Current Outpatient Medications:    aspirin EC 325 MG tablet, Take 1 tablet (325 mg total) by mouth daily. (Patient not taking: Reported on 01/31/2020), Disp: 30 tablet, Rfl: 0   cyclobenzaprine (FLEXERIL) 10 MG tablet, Take 10 mg by mouth 3 (three) times daily as needed for muscle spasms., Disp: , Rfl:    ferrous sulfate 325 (65 FE) MG tablet, Take 325 mg by mouth daily with breakfast., Disp: , Rfl:    fluconazole (DIFLUCAN) 200 MG tablet, Take 1 tablet (200 mg total) by mouth daily. (Patient taking differently: Take 200 mg by mouth as needed. ), Disp: 3 tablet, Rfl: PRN   gabapentin (NEURONTIN) 100 MG capsule, Take 100 mg by mouth 3 (three) times daily., Disp: , Rfl:    ibuprofen (ADVIL,MOTRIN) 600 MG tablet, Take 600 mg by mouth every 6 (six) hours as needed for mild pain., Disp: , Rfl:    insulin aspart (NOVOLOG) 100 UNIT/ML injection, Inject into the skin 3 (three) times daily before meals., Disp: , Rfl:    insulin detemir (LEVEMIR) 100 UNIT/ML injection, Inject 50 Units into the skin daily. , Disp: , Rfl:    Liraglutide 18 MG/3ML SOPN, Inject 0.6 mLs into the skin at bedtime., Disp: , Rfl:    meloxicam (MOBIC) 15 MG tablet, Take 15 mg by mouth as needed for pain., Disp: , Rfl:    metroNIDAZOLE (METROGEL VAGINAL) 0.75 % vaginal gel, Place 1 Applicatorful vaginally 2 (two) times a week. For 4-6 months. (Patient not taking: Reported on 04/08/2021), Disp: 70 g, Rfl: 4   Multiple Vitamin (MULTIVITAMIN WITH  MINERALS) TABS tablet, Take 1 tablet by mouth daily., Disp: , Rfl:    oxyCODONE-acetaminophen (ROXICET) 5-325 MG per tablet, Take 1 tablet by mouth every 4 (four) hours as needed for severe pain. (Patient not taking: Reported on 07/24/2018), Disp: 60 tablet, Rfl: 0   simvastatin (ZOCOR) 10 MG tablet, Take 10 mg by mouth daily., Disp: , Rfl:    terconazole (TERAZOL 7) 0.4 % vaginal cream, Place 1 applicator vaginally at bedtime. (Patient not taking: Reported on 04/08/2021), Disp: 45 g, Rfl: 3   valACYclovir (VALTREX) 500 MG tablet, Take 1 tablet (500 mg total) by mouth 2 (two) times daily. For 2 weeks. (Patient taking differently: Take 500 mg by mouth as needed. For 2 weeks.), Disp: 28 tablet, Rfl: 4   VENTOLIN HFA 108 (90 Base) MCG/ACT inhaler, , Disp: , Rfl:    vitamin B-12 (CYANOCOBALAMIN) 100 MCG tablet, Take 100 mcg by mouth daily., Disp: , Rfl:    zolpidem (AMBIEN) 10 MG tablet, Take 10 mg by mouth at bedtime as needed for sleep., Disp: , Rfl:    Allergies  Allergen Reactions   Phenergan [Promethazine Hcl]     Lost vision   Tramadol Other (See Comments)    "put her heart into shock"    Past Medical History:  Diagnosis Date   Abscess 11/2012   RIGHT FINGER   Arthritis    Cataracts, bilateral  Chronic back pain    Diabetes mellitus    type 1   Diabetic neuropathy (HCC)    Herpes virus disease    Hip fracture (HCC)    left side   History of blood transfusion 2013   left  hip- pinning   Shortness of breath dyspnea    with exertion   Urinary tract infection    hx of     Past Surgical History:  Procedure Laterality Date   BILATERAL SALPINGECTOMY     CATARACT EXTRACTION     left   CATARACT EXTRACTION W/PHACO  06/12/2012   Procedure: CATARACT EXTRACTION PHACO AND INTRAOCULAR LENS PLACEMENT (IOC);  Surgeon: [name], MD;  Location: [facility] OR;  Service: Ophthalmology;  Laterality: Right;   CATARACT EXTRACTION W/PHACO  06/15/2012   Procedure: CATARACT EXTRACTION PHACO AND  INTRAOCULAR LENS PLACEMENT (IOC);  Surgeon: [name], MD;  Location: [facility] OR;  Service: Ophthalmology;  Laterality: Left;   CESAREAN SECTION     x 3   CESAREAN SECTION WITH BILATERAL TUBAL LIGATION Bilateral 09/10/2013   Procedure: REPEAT CESAREAN SECTION WITH BILATERAL TUBAL LIGATION;  Surgeon: Purcell Nails, MD;  Location: [facility] ORS;  Service: Obstetrics;  Laterality: Bilateral;   EYE SURGERY Bilateral    FRACTURE SURGERY     surgery left hip   HAND SURGERY Right 11/2012   I & D EXTREMITY Right 11/23/2012   Procedure: IRRIGATION AND DEBRIDEMENT EXTREMITY;  Surgeon: Jodi Marble, MD;  Location: [facility] OR;  Service: Orthopedics;  Laterality: Right;   I & D EXTREMITY Right 11/30/2012   Procedure: IRRIGATION AND DEBRIDEMENT Right hand Abscess;  Surgeon: Jodi Marble, MD;  Location: [facility] OR;  Service: Orthopedics;  Laterality: Right;   TOTAL HIP ARTHROPLASTY Left 10/12/2014   Procedure: TOTAL HIP ARTHROPLASTY;  Surgeon: Nadara Mustard, MD;  Location: [facility] OR;  Service: Orthopedics;  Laterality: Left;    Family History  Problem Relation Age of Onset   Diabetes Mother    Heart disease Mother    Heart attack Father    Asthma Son    Healthy Son    Asthma Son    Healthy Son    Healthy Daughter     Social History   Tobacco Use   Smoking status: Never   Smokeless tobacco: Never  Vaping Use   Vaping Use: Never used  Substance Use Topics   Alcohol use: No    Alcohol/week: 0.0 standard drinks   Drug use: No    ROS   Objective:   Vitals: BP 123/71 (BP Location: Left Arm)   Pulse 86   Temp 98 F (36.7 C) (Oral)   Resp 18   SpO2 97%   Physical Exam Constitutional:      General: She is not in acute distress.    Appearance: Normal appearance. She is well-developed. She is obese. She is not ill-appearing, toxic-appearing or diaphoretic.  HENT:     Head: Normocephalic and atraumatic.     Nose: Nose normal.     Mouth/Throat:     Mouth: Mucous membranes are moist.  Eyes:      Extraocular Movements: Extraocular movements intact.     Pupils: Pupils are equal, round, and reactive to light.  Cardiovascular:     Rate and Rhythm: Normal rate and regular rhythm.     Pulses: Normal pulses.     Heart sounds: Normal heart sounds. No murmur heard.   No friction rub. No gallop.  Pulmonary:  Effort: Pulmonary effort is normal. No respiratory distress.     Breath sounds: Normal breath sounds. No stridor. No wheezing, rhonchi or rales.  Skin:    General: Skin is warm and dry.     Findings: No rash.  Neurological:     Mental Status: She is alert and oriented to person, place, and time.  Psychiatric:        Mood and Affect: Mood normal.        Behavior: Behavior normal.        Thought Content: Thought content normal.      Assessment and Plan :   PDMP not reviewed this encounter.  1. Viral respiratory illness   2. Encounter for screening laboratory testing for COVID-19 virus   3. Type 1 diabetes mellitus without complication (HCC)     Will manage for viral illness such as viral URI, viral syndrome, viral rhinitis, COVID-19. Counseled patient on nature of COVID-19 including modes of transmission, diagnostic testing, management and supportive care.  Offered scripts for symptomatic relief. COVID 19 testing is pending.  Because of her diabetes, patient would be a good candidate for antiviral COVID medication.  Counseled patient on potential for adverse effects with medications prescribed/recommended today, ER and return-to-clinic precautions discussed, patient verbalized understanding.      Wallis Bamberg, PA-C 04/08/21 1122

## 2021-04-08 NOTE — ED Triage Notes (Signed)
Pt here for cough and nasal congestion x 3 days 

## 2021-04-08 NOTE — Discharge Instructions (Addendum)

## 2021-04-09 LAB — SARS-COV-2, NAA 2 DAY TAT

## 2021-04-09 LAB — NOVEL CORONAVIRUS, NAA: SARS-CoV-2, NAA: DETECTED — AB

## 2021-06-13 ENCOUNTER — Ambulatory Visit: Payer: Medicare Other | Admitting: Podiatry

## 2021-12-18 ENCOUNTER — Ambulatory Visit (INDEPENDENT_AMBULATORY_CARE_PROVIDER_SITE_OTHER): Payer: Medicare Other | Admitting: Family Medicine

## 2021-12-26 ENCOUNTER — Ambulatory Visit (INDEPENDENT_AMBULATORY_CARE_PROVIDER_SITE_OTHER): Payer: Medicare HMO | Admitting: Bariatrics

## 2022-01-01 ENCOUNTER — Ambulatory Visit (INDEPENDENT_AMBULATORY_CARE_PROVIDER_SITE_OTHER): Payer: Medicare Other | Admitting: Family Medicine

## 2022-01-09 ENCOUNTER — Ambulatory Visit (INDEPENDENT_AMBULATORY_CARE_PROVIDER_SITE_OTHER): Payer: Medicare HMO | Admitting: Bariatrics

## 2022-01-30 ENCOUNTER — Encounter: Payer: Self-pay | Admitting: Podiatry

## 2022-01-30 ENCOUNTER — Ambulatory Visit (INDEPENDENT_AMBULATORY_CARE_PROVIDER_SITE_OTHER): Payer: Medicare HMO | Admitting: Podiatry

## 2022-01-30 DIAGNOSIS — E1142 Type 2 diabetes mellitus with diabetic polyneuropathy: Secondary | ICD-10-CM

## 2022-01-30 DIAGNOSIS — M129 Arthropathy, unspecified: Secondary | ICD-10-CM | POA: Diagnosis not present

## 2022-01-30 DIAGNOSIS — M792 Neuralgia and neuritis, unspecified: Secondary | ICD-10-CM | POA: Insufficient documentation
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
## 2022-02-14 DIAGNOSIS — Z6841 Body Mass Index (BMI) 40.0 and over, adult: Secondary | ICD-10-CM | POA: Diagnosis not present

## 2022-02-14 DIAGNOSIS — Z0000 Encounter for general adult medical examination without abnormal findings: Secondary | ICD-10-CM | POA: Diagnosis not present

## 2022-02-14 DIAGNOSIS — E1142 Type 2 diabetes mellitus with diabetic polyneuropathy: Secondary | ICD-10-CM | POA: Diagnosis not present

## 2022-02-14 DIAGNOSIS — E559 Vitamin D deficiency, unspecified: Secondary | ICD-10-CM | POA: Diagnosis not present

## 2022-02-22 ENCOUNTER — Ambulatory Visit: Payer: Medicare HMO

## 2022-02-22 ENCOUNTER — Ambulatory Visit (INDEPENDENT_AMBULATORY_CARE_PROVIDER_SITE_OTHER): Payer: Medicare HMO | Admitting: Podiatry

## 2022-02-22 DIAGNOSIS — M19079 Primary osteoarthritis, unspecified ankle and foot: Secondary | ICD-10-CM | POA: Diagnosis not present

## 2022-02-22 DIAGNOSIS — M778 Other enthesopathies, not elsewhere classified: Secondary | ICD-10-CM

## 2022-02-22 DIAGNOSIS — E1142 Type 2 diabetes mellitus with diabetic polyneuropathy: Secondary | ICD-10-CM | POA: Diagnosis not present

## 2022-02-27 ENCOUNTER — Encounter: Payer: Self-pay | Admitting: Podiatry

## 2022-02-27 NOTE — Progress Notes (Signed)
Subjective:  Patient ID: [name], female    DOB: [birth year],  MRN: [id number]  Chief Complaint  Patient presents with   Diabetes    46 y.o. female presents with the above complaint.  Patient presents with complaint of left foot midfoot pain.  Patient states that she had a history of Lisfranc injury.  She is a diabetic with last that is unknown however she states it is controlled.  She states this hurts with ambulation has progressed gotten worse.  She has been treated in the past with multiple conservative care however she has failed them all.  She is referred to me by Dr. Collene Schlichter for evaluation of left midfoot.  She is having a lot of arthritic pain pain scale 7 out of 10.  She also has secondary complaint of right dorsal midfoot pain as well.  She said hurts with ambulation she wanted get that evaluated as well.  She has some history of arthritis there as well.   Review of Systems: Negative except as noted in the HPI. Denies N/V/F/Ch.  Past Medical History:  Diagnosis Date   Abscess 11/2012   RIGHT FINGER   Arthritis    Cataracts, bilateral    Chronic back pain    Diabetes mellitus    type 1   Diabetic neuropathy (HCC)    Herpes virus disease    Hip fracture (HCC)    left side   History of blood transfusion 2013   left  hip- pinning   Shortness of breath dyspnea    with exertion   Urinary tract infection    hx of    Current Outpatient Medications:    aspirin EC 325 MG tablet, Take 1 tablet (325 mg total) by mouth daily. (Patient not taking: Reported on 01/31/2020), Disp: 30 tablet, Rfl: 0   benzonatate (TESSALON) 100 MG capsule, Take 1-2 capsules (100-200 mg total) by mouth 3 (three) times daily as needed., Disp: 60 capsule, Rfl: 0   cetirizine (ZYRTEC ALLERGY) 10 MG tablet, Take 1 tablet (10 mg total) by mouth daily., Disp: 30 tablet, Rfl: 0   cyclobenzaprine (FLEXERIL) 10 MG tablet, Take 10 mg by mouth 3 (three) times daily as needed for muscle spasms., Disp: , Rfl:     ferrous sulfate 325 (65 FE) MG tablet, Take 325 mg by mouth daily with breakfast., Disp: , Rfl:    fluconazole (DIFLUCAN) 200 MG tablet, Take 1 tablet (200 mg total) by mouth daily. (Patient taking differently: Take 200 mg by mouth as needed. ), Disp: 3 tablet, Rfl: PRN   gabapentin (NEURONTIN) 100 MG capsule, Take 100 mg by mouth 3 (three) times daily., Disp: , Rfl:    ibuprofen (ADVIL,MOTRIN) 600 MG tablet, Take 600 mg by mouth every 6 (six) hours as needed for mild pain., Disp: , Rfl:    insulin aspart (NOVOLOG) 100 UNIT/ML injection, Inject into the skin 3 (three) times daily before meals., Disp: , Rfl:    insulin detemir (LEVEMIR) 100 UNIT/ML injection, Inject 50 Units into the skin daily. , Disp: , Rfl:    Liraglutide 18 MG/3ML SOPN, Inject 0.6 mLs into the skin at bedtime., Disp: , Rfl:    meloxicam (MOBIC) 15 MG tablet, Take 15 mg by mouth as needed for pain., Disp: , Rfl:    metroNIDAZOLE (METROGEL VAGINAL) 0.75 % vaginal gel, Place 1 Applicatorful vaginally 2 (two) times a week. For 4-6 months. (Patient not taking: Reported on 04/08/2021), Disp: 70 g, Rfl: 4   Multiple Vitamin (MULTIVITAMIN WITH  MINERALS) TABS tablet, Take 1 tablet by mouth daily., Disp: , Rfl:    oxyCODONE-acetaminophen (ROXICET) 5-325 MG per tablet, Take 1 tablet by mouth every 4 (four) hours as needed for severe pain. (Patient not taking: Reported on 07/24/2018), Disp: 60 tablet, Rfl: 0   pseudoephedrine (SUDAFED) 60 MG tablet, Take 1 tablet (60 mg total) by mouth every 8 (eight) hours as needed for congestion., Disp: 30 tablet, Rfl: 0   simvastatin (ZOCOR) 10 MG tablet, Take 10 mg by mouth daily., Disp: , Rfl:    terconazole (TERAZOL 7) 0.4 % vaginal cream, Place 1 applicator vaginally at bedtime. (Patient not taking: Reported on 04/08/2021), Disp: 45 g, Rfl: 3   valACYclovir (VALTREX) 500 MG tablet, Take 1 tablet (500 mg total) by mouth 2 (two) times daily. For 2 weeks. (Patient taking differently: Take 500 mg by mouth as  needed. For 2 weeks.), Disp: 28 tablet, Rfl: 4   VENTOLIN HFA 108 (90 Base) MCG/ACT inhaler, , Disp: , Rfl:    vitamin B-12 (CYANOCOBALAMIN) 100 MCG tablet, Take 100 mcg by mouth daily., Disp: , Rfl:    zolpidem (AMBIEN) 10 MG tablet, Take 10 mg by mouth at bedtime as needed for sleep., Disp: , Rfl:   Social History   Tobacco Use  Smoking Status Never  Smokeless Tobacco Never    Allergies  Allergen Reactions   Phenergan [Promethazine Hcl]     Lost vision   Tramadol Other (See Comments)    "put her heart into shock"   Objective:  There were no vitals filed for this visit. There is no height or weight on file to calculate BMI. Constitutional Well developed. Well nourished.  Vascular Dorsalis pedis pulses palpable bilaterally. Posterior tibial pulses palpable bilaterally. Capillary refill normal to all digits.  No cyanosis or clubbing noted. Pedal hair growth normal.  Neurologic Normal speech. Oriented to person, place, and time. Epicritic sensation to light touch grossly present bilaterally.  Dermatologic Nails well groomed and normal in appearance. No open wounds. No skin lesions.  Orthopedic: Pain on palpation to the left dorsal midfoot.  Negative piano key test.  Pain at the second and third tarsometatarsal joints.  No extensor or flexor tendinitis noted.  No pain with range of motion of the metatarsophalangeal joints noted.  Pain on palpation of right dorsal midfoot.  Negative piano key test.  Pain at the second metatarsal tarsometatarsal joint.  No extensor or flexor tendinitis noted no pain with range of motion of 1 through 5 metatarsal phalangeal joint   Radiographs: 3 views of skeletally mature adult left foot: Midfoot arthritis noted.  No other bony abnormalities identified.  Pes planovalgus foot structure noted Assessment:   1. Diabetic polyneuropathy associated with type 2 diabetes mellitus (HCC)   2. Capsulitis of right foot   3. Arthritis, midfoot    Plan:   Patient was evaluated and treated and all questions answered.  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  Given the amount of pain that she is experiencing failure of conservative care she will benefit from CT scan for further evaluation and management.  I will order CT scan to assess the midfoot joints if there is too much arthritis patient will need to consider surgical fusion of those joints.  I discussed with patient she states understand like to proceed with CT scan -CT scan was ordered  Right midfoot capsulitis -I explained to the patient the etiology  of capsulitis and worse treatment options were discussed.  Given the amount of pain she is having she will benefit from steroid injection of decrease inflammatory component associate with pain.  Patient agrees with the plan like to proceed with steroid injection -A steroid injection was performed at right dorsal midfoot using 1% plain Lidocaine and 10 mg of Kenalog. This was well tolerated.   No follow-ups on file.

## 2022-03-07 DIAGNOSIS — E1165 Type 2 diabetes mellitus with hyperglycemia: Secondary | ICD-10-CM | POA: Diagnosis not present

## 2022-03-08 ENCOUNTER — Ambulatory Visit: Payer: Medicare HMO | Admitting: Podiatry

## 2022-03-21 ENCOUNTER — Ambulatory Visit: Payer: Medicare HMO | Admitting: Podiatry

## 2022-03-26 ENCOUNTER — Ambulatory Visit
Admission: RE | Admit: 2022-03-26 | Discharge: 2022-03-26 | Disposition: A | Discharge status: Home / Self Care | Payer: Medicare HMO | Source: Ambulatory Visit | Attending: Podiatry | Admitting: Podiatry

## 2022-03-26 DIAGNOSIS — M7989 Other specified soft tissue disorders: Secondary | ICD-10-CM | POA: Diagnosis not present

## 2022-03-26 DIAGNOSIS — M19072 Primary osteoarthritis, left ankle and foot: Secondary | ICD-10-CM | POA: Diagnosis not present

## 2022-03-26 DIAGNOSIS — M19079 Primary osteoarthritis, unspecified ankle and foot: Secondary | ICD-10-CM

## 2022-03-28 DIAGNOSIS — Z6841 Body Mass Index (BMI) 40.0 and over, adult: Secondary | ICD-10-CM | POA: Diagnosis not present

## 2022-03-28 DIAGNOSIS — G4700 Insomnia, unspecified: Secondary | ICD-10-CM | POA: Diagnosis not present

## 2022-03-28 DIAGNOSIS — E1142 Type 2 diabetes mellitus with diabetic polyneuropathy: Secondary | ICD-10-CM | POA: Diagnosis not present

## 2022-04-06 DIAGNOSIS — E1165 Type 2 diabetes mellitus with hyperglycemia: Secondary | ICD-10-CM | POA: Diagnosis not present

## 2022-04-17 ENCOUNTER — Ambulatory Visit (INDEPENDENT_AMBULATORY_CARE_PROVIDER_SITE_OTHER): Payer: Medicare HMO | Admitting: Podiatry

## 2022-04-17 DIAGNOSIS — M778 Other enthesopathies, not elsewhere classified: Secondary | ICD-10-CM | POA: Diagnosis not present

## 2022-04-17 NOTE — Progress Notes (Signed)
Subjective:  Patient ID: [name], female    DOB: Jun 29, 1976,  MRN: [id number]  Chief Complaint  Patient presents with   Foot Pain     BILATERAL FOOT NEUROPATHY, LEFT OVER RIGHT, WALKING TRIGGERS PAIN IN THE MIDFOOT     46 y.o. female presents with the above complaint.  Patient presents with follow-up of left midfoot pain.  She states that she is doing a little bit better.  Her right hip pain is completely gone.  She had a CT scan.  She would like to know if she can do injection to the left side as well.   Review of Systems: Negative except as noted in the HPI. Denies N/V/F/Ch.  Past Medical History:  Diagnosis Date   Abscess 11/2012   RIGHT FINGER   Arthritis    Cataracts, bilateral    Chronic back pain    Diabetes mellitus    type 1   Diabetic neuropathy (HCC)    Herpes virus disease    Hip fracture (HCC)    left side   History of blood transfusion 2013   left  hip- pinning   Shortness of breath dyspnea    with exertion   Urinary tract infection    hx of    Current Outpatient Medications:    aspirin EC 325 MG tablet, Take 1 tablet (325 mg total) by mouth daily., Disp: 30 tablet, Rfl: 0   benzonatate (TESSALON) 100 MG capsule, Take 1-2 capsules (100-200 mg total) by mouth 3 (three) times daily as needed., Disp: 60 capsule, Rfl: 0   cetirizine (ZYRTEC ALLERGY) 10 MG tablet, Take 1 tablet (10 mg total) by mouth daily., Disp: 30 tablet, Rfl: 0   cyclobenzaprine (FLEXERIL) 10 MG tablet, Take 10 mg by mouth 3 (three) times daily as needed for muscle spasms., Disp: , Rfl:    ferrous sulfate 325 (65 FE) MG tablet, Take 325 mg by mouth daily with breakfast., Disp: , Rfl:    fluconazole (DIFLUCAN) 200 MG tablet, Take 1 tablet (200 mg total) by mouth daily. (Patient taking differently: Take 200 mg by mouth as needed.), Disp: 3 tablet, Rfl: PRN   gabapentin (NEURONTIN) 100 MG capsule, Take 100 mg by mouth 3 (three) times daily., Disp: , Rfl:    ibuprofen (ADVIL,MOTRIN) 600 MG  tablet, Take 600 mg by mouth every 6 (six) hours as needed for mild pain., Disp: , Rfl:    insulin aspart (NOVOLOG) 100 UNIT/ML injection, Inject into the skin 3 (three) times daily before meals., Disp: , Rfl:    insulin detemir (LEVEMIR) 100 UNIT/ML injection, Inject 50 Units into the skin daily. , Disp: , Rfl:    Liraglutide 18 MG/3ML SOPN, Inject 0.6 mLs into the skin at bedtime., Disp: , Rfl:    meloxicam (MOBIC) 15 MG tablet, Take 15 mg by mouth as needed for pain., Disp: , Rfl:    metroNIDAZOLE (METROGEL VAGINAL) 0.75 % vaginal gel, Place 1 Applicatorful vaginally 2 (two) times a week. For 4-6 months., Disp: 70 g, Rfl: 4   Multiple Vitamin (MULTIVITAMIN WITH MINERALS) TABS tablet, Take 1 tablet by mouth daily., Disp: , Rfl:    oxyCODONE-acetaminophen (ROXICET) 5-325 MG per tablet, Take 1 tablet by mouth every 4 (four) hours as needed for severe pain., Disp: 60 tablet, Rfl: 0   pseudoephedrine (SUDAFED) 60 MG tablet, Take 1 tablet (60 mg total) by mouth every 8 (eight) hours as needed for congestion., Disp: 30 tablet, Rfl: 0   simvastatin (ZOCOR) 10 MG tablet,  Take 10 mg by mouth daily., Disp: , Rfl:    terconazole (TERAZOL 7) 0.4 % vaginal cream, Place 1 applicator vaginally at bedtime., Disp: 45 g, Rfl: 3   valACYclovir (VALTREX) 500 MG tablet, Take 1 tablet (500 mg total) by mouth 2 (two) times daily. For 2 weeks. (Patient taking differently: Take 500 mg by mouth as needed. For 2 weeks.), Disp: 28 tablet, Rfl: 4   VENTOLIN HFA 108 (90 Base) MCG/ACT inhaler, , Disp: , Rfl:    vitamin B-12 (CYANOCOBALAMIN) 100 MCG tablet, Take 100 mcg by mouth daily., Disp: , Rfl:    zolpidem (AMBIEN) 10 MG tablet, Take 10 mg by mouth at bedtime as needed for sleep., Disp: , Rfl:   Social History   Tobacco Use  Smoking Status Never  Smokeless Tobacco Never    Allergies  Allergen Reactions   Phenergan [Promethazine Hcl]     Lost vision   Tramadol Other (See Comments)    "put her heart into shock"    Objective:  There were no vitals filed for this visit. There is no height or weight on file to calculate BMI. Constitutional Well developed. Well nourished.  Vascular Dorsalis pedis pulses palpable bilaterally. Posterior tibial pulses palpable bilaterally. Capillary refill normal to all digits.  No cyanosis or clubbing noted. Pedal hair growth normal.  Neurologic Normal speech. Oriented to person, place, and time. Epicritic sensation to light touch grossly present bilaterally.  Dermatologic Nails well groomed and normal in appearance. No open wounds. No skin lesions.  Orthopedic: Pain on palpation to the left dorsal midfoot.  Negative piano key test.  Pain at the second and third tarsometatarsal joints.  No extensor or flexor tendinitis noted.  No pain with range of motion of the metatarsophalangeal joints noted.  No further pain on palpation of right dorsal midfoot.  Negative piano key test.  No further pain at the second metatarsal tarsometatarsal joint.  No extensor or flexor tendinitis noted no pain with range of motion of 1 through 5 metatarsal phalangeal joint   Radiographs: 3 views of skeletally mature adult left foot: Midfoot arthritis noted.  No other bony abnormalities identified.  Pes planovalgus foot structure noted Assessment:   1. Capsulitis of right foot   2. Capsulitis of left foot     Plan:  Patient was evaluated and treated and all questions answered.  Left midfoot arthritis with underlying osteoarthritis  -All questions and concerns were discussed with the patient in extensive detail.   - She has arthritis in the second and third tarsometatarsal joint.  This is right where it correlates clinically with her pain.  Given the amount of arthritis is present she will benefit from a steroid injection.  I discussed with the patient she states understand like to proceed with steroid injection -A steroid injection was performed at left dorsal midfoot using 1% plain  Lidocaine and 10 mg of Kenalog. This was well tolerated.   Right midfoot capsulitis -Clinically healed the steroid injection helped considerably.  I discussed shoe gear modification.   No follow-ups on file.

## 2022-04-19 ENCOUNTER — Other Ambulatory Visit: Payer: Self-pay

## 2022-04-19 ENCOUNTER — Emergency Department (HOSPITAL_COMMUNITY)
Admission: EM | Admit: 2022-04-19 | Discharge: 2022-04-20 | Discharge status: Against Medical Advice | Payer: Medicare HMO | Attending: Emergency Medicine | Admitting: Emergency Medicine

## 2022-04-19 ENCOUNTER — Encounter (HOSPITAL_COMMUNITY): Payer: Self-pay

## 2022-04-19 DIAGNOSIS — R197 Diarrhea, unspecified: Secondary | ICD-10-CM | POA: Diagnosis not present

## 2022-04-19 DIAGNOSIS — M791 Myalgia, unspecified site: Secondary | ICD-10-CM | POA: Diagnosis not present

## 2022-04-19 DIAGNOSIS — R5383 Other fatigue: Secondary | ICD-10-CM | POA: Diagnosis not present

## 2022-04-19 DIAGNOSIS — Z5321 Procedure and treatment not carried out due to patient leaving prior to being seen by health care provider: Secondary | ICD-10-CM | POA: Diagnosis not present

## 2022-04-19 DIAGNOSIS — R531 Weakness: Secondary | ICD-10-CM | POA: Diagnosis not present

## 2022-04-19 DIAGNOSIS — R112 Nausea with vomiting, unspecified: Secondary | ICD-10-CM | POA: Insufficient documentation

## 2022-04-19 DIAGNOSIS — R6883 Chills (without fever): Secondary | ICD-10-CM | POA: Insufficient documentation

## 2022-04-19 DIAGNOSIS — E162 Hypoglycemia, unspecified: Secondary | ICD-10-CM | POA: Diagnosis not present

## 2022-04-19 LAB — CBC WITH DIFFERENTIAL/PLATELET
Abs Immature Granulocytes: 0.03 10*3/uL (ref 0.00–0.07)
Basophils Absolute: 0 10*3/uL (ref 0.0–0.1)
Basophils Relative: 0 %
Eosinophils Absolute: 0.1 10*3/uL (ref 0.0–0.5)
Eosinophils Relative: 1 %
HCT: 36.1 % (ref 36.0–46.0)
Hemoglobin: 11.3 g/dL — ABNORMAL LOW (ref 12.0–15.0)
Immature Granulocytes: 0 %
Lymphocytes Relative: 25 %
Lymphs Abs: 2.9 10*3/uL (ref 0.7–4.0)
MCH: 25 pg — ABNORMAL LOW (ref 26.0–34.0)
MCHC: 31.3 g/dL (ref 30.0–36.0)
MCV: 79.9 fL — ABNORMAL LOW (ref 80.0–100.0)
Monocytes Absolute: 0.5 10*3/uL (ref 0.1–1.0)
Monocytes Relative: 5 %
Neutro Abs: 8.2 10*3/uL — ABNORMAL HIGH (ref 1.7–7.7)
Neutrophils Relative %: 69 %
Platelets: 382 10*3/uL (ref 150–400)
RBC: 4.52 MIL/uL (ref 3.87–5.11)
RDW: 15 % (ref 11.5–15.5)
WBC: 11.7 10*3/uL — ABNORMAL HIGH (ref 4.0–10.5)
nRBC: 0 % (ref 0.0–0.2)

## 2022-04-19 LAB — CBG MONITORING, ED: Glucose-Capillary: 139 mg/dL — ABNORMAL HIGH (ref 70–99)

## 2022-04-19 NOTE — ED Provider Triage Note (Signed)
Emergency Medicine Provider Triage Evaluation Note  [name] , a 46 y.o. female  was evaluated in triage.  Pt complains of not feeling well throughout the day.  Her home glucose sensor was reporting low blood sugars throughout the day.  She switched to a new sensor which continues to report were low blood sugars.  She endorses nausea and chills.  Took a home COVID test but does not know the results.  Blood glucose in triage was 139.   Review of Systems  Positive: As above Negative: As above  Physical Exam  BP (!) 165/93 (BP Location: Left Arm)   Pulse 90   Temp 98.7 F (37.1 C)   Resp 18   Ht 5\' 2"  (1.575 m)   Wt 114.8 kg   SpO2 100%   BMI 46.27 kg/m  Gen:   Awake, no distress   Resp:  Normal effort MSK:   Moves extremities without difficulty  Other:    Medical Decision Making  Medically screening exam initiated at 11:27 PM.  Appropriate orders placed.  [name] was informed that the remainder of the evaluation will be completed by another provider, this initial triage assessment does not replace that evaluation, and the importance of remaining in the ED until their evaluation is complete.    [name], PA-C 04/19/22 2328

## 2022-04-19 NOTE — ED Triage Notes (Signed)
Weakness, fatigue, n/v/d, chills, body aches that started today. Pt reports cbg 54 @ home and drunk Orange juice PTA. Cbg in triage 139

## 2022-04-20 LAB — BASIC METABOLIC PANEL
Anion gap: 8 (ref 5–15)
BUN: 14 mg/dL (ref 6–20)
CO2: 24 mmol/L (ref 22–32)
Calcium: 9.3 mg/dL (ref 8.9–10.3)
Chloride: 105 mmol/L (ref 98–111)
Creatinine, Ser: 0.86 mg/dL (ref 0.44–1.00)
GFR, Estimated: >60 mL/min (ref >60)
Glucose, Bld: 146 mg/dL — ABNORMAL HIGH (ref 70–99)
Potassium: 4.3 mmol/L (ref 3.5–5.1)
Sodium: 137 mmol/L (ref 135–145)

## 2022-04-20 NOTE — ED Notes (Signed)
Patient reports she is leaving and will return if symptoms get worse. Advised patient to stay.

## 2022-04-24 ENCOUNTER — Encounter (INDEPENDENT_AMBULATORY_CARE_PROVIDER_SITE_OTHER): Payer: Self-pay

## 2022-05-07 DIAGNOSIS — E1165 Type 2 diabetes mellitus with hyperglycemia: Secondary | ICD-10-CM | POA: Diagnosis not present

## 2022-05-08 ENCOUNTER — Encounter (HOSPITAL_COMMUNITY): Payer: Self-pay

## 2022-05-08 ENCOUNTER — Other Ambulatory Visit: Payer: Self-pay

## 2022-05-08 ENCOUNTER — Emergency Department (HOSPITAL_COMMUNITY): Payer: Medicare HMO

## 2022-05-08 ENCOUNTER — Emergency Department (HOSPITAL_COMMUNITY)
Admission: EM | Admit: 2022-05-08 | Discharge: 2022-05-08 | Discharge status: Against Medical Advice | Payer: Medicare HMO | Attending: Emergency Medicine | Admitting: Emergency Medicine

## 2022-05-08 DIAGNOSIS — R112 Nausea with vomiting, unspecified: Secondary | ICD-10-CM | POA: Insufficient documentation

## 2022-05-08 DIAGNOSIS — Z5321 Procedure and treatment not carried out due to patient leaving prior to being seen by health care provider: Secondary | ICD-10-CM | POA: Insufficient documentation

## 2022-05-08 DIAGNOSIS — R197 Diarrhea, unspecified: Secondary | ICD-10-CM | POA: Insufficient documentation

## 2022-05-08 DIAGNOSIS — R072 Precordial pain: Secondary | ICD-10-CM | POA: Diagnosis not present

## 2022-05-08 DIAGNOSIS — R0789 Other chest pain: Secondary | ICD-10-CM | POA: Diagnosis not present

## 2022-05-08 DIAGNOSIS — R110 Nausea: Secondary | ICD-10-CM | POA: Diagnosis not present

## 2022-05-08 DIAGNOSIS — R1111 Vomiting without nausea: Secondary | ICD-10-CM | POA: Diagnosis not present

## 2022-05-08 DIAGNOSIS — R079 Chest pain, unspecified: Secondary | ICD-10-CM | POA: Diagnosis not present

## 2022-05-08 DIAGNOSIS — R42 Dizziness and giddiness: Secondary | ICD-10-CM | POA: Diagnosis not present

## 2022-05-08 DIAGNOSIS — I10 Essential (primary) hypertension: Secondary | ICD-10-CM | POA: Diagnosis not present

## 2022-05-08 LAB — COMPREHENSIVE METABOLIC PANEL
ALT: 13 U/L (ref 0–44)
AST: 19 U/L (ref 15–41)
Albumin: 3.8 g/dL (ref 3.5–5.0)
Alkaline Phosphatase: 53 U/L (ref 38–126)
Anion gap: 8 (ref 5–15)
BUN: 15 mg/dL (ref 6–20)
CO2: 26 mmol/L (ref 22–32)
Calcium: 9.3 mg/dL (ref 8.9–10.3)
Chloride: 103 mmol/L (ref 98–111)
Creatinine, Ser: 0.84 mg/dL (ref 0.44–1.00)
GFR, Estimated: >60 mL/min (ref >60)
Glucose, Bld: 106 mg/dL — ABNORMAL HIGH (ref 70–99)
Potassium: 4.1 mmol/L (ref 3.5–5.1)
Sodium: 137 mmol/L (ref 135–145)
Total Bilirubin: 0.2 mg/dL — ABNORMAL LOW (ref 0.3–1.2)
Total Protein: 8 g/dL (ref 6.5–8.1)

## 2022-05-08 LAB — TROPONIN I (HIGH SENSITIVITY): Troponin I (High Sensitivity): 2 ng/L (ref <18)

## 2022-05-08 LAB — URINALYSIS, ROUTINE W REFLEX MICROSCOPIC
Bilirubin Urine: NEGATIVE
Glucose, UA: NEGATIVE mg/dL
Hgb urine dipstick: NEGATIVE
Ketones, ur: NEGATIVE mg/dL
Leukocytes,Ua: NEGATIVE
Nitrite: NEGATIVE
Protein, ur: NEGATIVE mg/dL
Specific Gravity, Urine: 1.02 (ref 1.005–1.030)
pH: 5 (ref 5.0–8.0)

## 2022-05-08 LAB — CBC
HCT: 38.1 % (ref 36.0–46.0)
Hemoglobin: 11.9 g/dL — ABNORMAL LOW (ref 12.0–15.0)
MCH: 25.1 pg — ABNORMAL LOW (ref 26.0–34.0)
MCHC: 31.2 g/dL (ref 30.0–36.0)
MCV: 80.2 fL (ref 80.0–100.0)
Platelets: 320 10*3/uL (ref 150–400)
RBC: 4.75 MIL/uL (ref 3.87–5.11)
RDW: 14.6 % (ref 11.5–15.5)
WBC: 11.8 10*3/uL — ABNORMAL HIGH (ref 4.0–10.5)
nRBC: 0 % (ref 0.0–0.2)

## 2022-05-08 LAB — LIPASE, BLOOD: Lipase: 40 U/L (ref 11–51)

## 2022-05-08 LAB — I-STAT BETA HCG BLOOD, ED (MC, WL, AP ONLY): I-stat hCG, quantitative: <5 m[IU]/mL (ref <5)

## 2022-05-08 NOTE — ED Provider Triage Note (Signed)
Emergency Medicine Provider Triage Evaluation Note  [name] , a 46 y.o. female  was evaluated in triage.  Pt complains of nausea, vomiting, diarrhea which began this morning.  Patient states that her nausea and vomiting have been severe with multiple episodes.  She states she had her weekly injection of Mounjaro yesterday.  She states that this is approximately 8 weeks of injections.  She has not had side effects before but is concerned that this could be an adverse reaction to her medication.  Upon arrival the patient also felt substernal chest pain rated 8 out of 10 in severity with no radiation of symptoms.  She denies shortness of breath.  Denies abdominal pain at this time.  Review of Systems  Positive: Nausea, vomiting, chest pain, diarrhea Negative: Shortness of breath, abdominal pain  Physical Exam  BP (!) 148/74   Pulse 80   Resp 20   Ht 5\' 2"  (1.575 m)   Wt 108 kg   SpO2 96%   BMI 43.53 kg/m  Gen:   Awake, no distress   Resp:  Normal effort  MSK:   Moves extremities without difficulty  Other:    Medical Decision Making  Medically screening exam initiated at 11:07 AM.  Appropriate orders placed.  [name] was informed that the remainder of the evaluation will be completed by another provider, this initial triage assessment does not replace that evaluation, and the importance of remaining in the ED until their evaluation is complete.     [name], PA-C 05/08/22 1109

## 2022-05-08 NOTE — ED Triage Notes (Addendum)
Pt BIB EMS from home. Pt endorses N/V/D that began around 4am this morning. Pt thinks it may be related to a diabetic shot she had on Monday. Pt also endorses chest pain that began after vomiting.  BP 152/80 HR 80 RR 20 96% RA CBG 97

## 2022-05-08 NOTE — ED Notes (Signed)
Pt turned in labels and stated that she was leaving

## 2022-05-14 DIAGNOSIS — M545 Low back pain, unspecified: Secondary | ICD-10-CM | POA: Diagnosis not present

## 2022-05-14 DIAGNOSIS — E1142 Type 2 diabetes mellitus with diabetic polyneuropathy: Secondary | ICD-10-CM | POA: Diagnosis not present

## 2022-05-14 DIAGNOSIS — R112 Nausea with vomiting, unspecified: Secondary | ICD-10-CM | POA: Diagnosis not present

## 2022-05-14 DIAGNOSIS — Z6841 Body Mass Index (BMI) 40.0 and over, adult: Secondary | ICD-10-CM | POA: Diagnosis not present

## 2022-05-21 DIAGNOSIS — E1165 Type 2 diabetes mellitus with hyperglycemia: Secondary | ICD-10-CM | POA: Diagnosis not present

## 2022-05-23 DIAGNOSIS — E1142 Type 2 diabetes mellitus with diabetic polyneuropathy: Secondary | ICD-10-CM | POA: Diagnosis not present

## 2022-08-20 DIAGNOSIS — Z7189 Other specified counseling: Secondary | ICD-10-CM | POA: Diagnosis not present

## 2022-08-20 DIAGNOSIS — Z6841 Body Mass Index (BMI) 40.0 and over, adult: Secondary | ICD-10-CM | POA: Diagnosis not present

## 2022-08-20 DIAGNOSIS — E1142 Type 2 diabetes mellitus with diabetic polyneuropathy: Secondary | ICD-10-CM | POA: Diagnosis not present

## 2022-11-19 DIAGNOSIS — M545 Low back pain, unspecified: Secondary | ICD-10-CM | POA: Diagnosis not present

## 2022-11-19 DIAGNOSIS — Z6841 Body Mass Index (BMI) 40.0 and over, adult: Secondary | ICD-10-CM | POA: Diagnosis not present

## 2022-11-19 DIAGNOSIS — Z7189 Other specified counseling: Secondary | ICD-10-CM | POA: Diagnosis not present

## 2022-11-19 DIAGNOSIS — E1142 Type 2 diabetes mellitus with diabetic polyneuropathy: Secondary | ICD-10-CM | POA: Diagnosis not present

## 2022-12-06 ENCOUNTER — Ambulatory Visit
Admission: RE | Admit: 2022-12-06 | Discharge: 2022-12-06 | Disposition: A | Discharge status: Home / Self Care | Payer: Medicare HMO | Source: Ambulatory Visit | Attending: Physician Assistant | Admitting: Physician Assistant

## 2022-12-06 VITALS — BP 104/75 | HR 93 | Temp 98.7°F | Resp 22

## 2022-12-06 DIAGNOSIS — R509 Fever, unspecified: Secondary | ICD-10-CM

## 2022-12-06 DIAGNOSIS — J029 Acute pharyngitis, unspecified: Secondary | ICD-10-CM

## 2022-12-06 DIAGNOSIS — J111 Influenza due to unidentified influenza virus with other respiratory manifestations: Secondary | ICD-10-CM | POA: Insufficient documentation

## 2022-12-06 DIAGNOSIS — Z1152 Encounter for screening for COVID-19: Secondary | ICD-10-CM

## 2022-12-06 DIAGNOSIS — J069 Acute upper respiratory infection, unspecified: Secondary | ICD-10-CM | POA: Diagnosis not present

## 2022-12-06 LAB — POCT INFLUENZA A/B
Influenza A, POC: POSITIVE — AB
Influenza B, POC: NEGATIVE

## 2022-12-06 LAB — POCT RAPID STREP A (OFFICE): Rapid Strep A Screen: NEGATIVE

## 2022-12-06 MED ORDER — DM-GUAIFENESIN ER 30-600 MG PO TB12: 1.0000 | ORAL_TABLET | Freq: Two times a day (BID) | ORAL | 0 refills | Status: AC

## 2022-12-06 MED ORDER — ONDANSETRON 4 MG PO TBDP
4.0000 mg | ORAL_TABLET | Freq: Once | ORAL | Status: AC
  Administered 2022-12-06: 4 mg via ORAL

## 2022-12-06 MED ORDER — ONDANSETRON HCL 4 MG PO TABS: 4.0000 mg | ORAL_TABLET | Freq: Three times a day (TID) | ORAL | 0 refills | Status: AC | PRN

## 2022-12-06 MED ORDER — OSELTAMIVIR PHOSPHATE 75 MG PO CAPS: 75.0000 mg | ORAL_CAPSULE | Freq: Two times a day (BID) | ORAL | 0 refills | Status: DC

## 2022-12-06 NOTE — ED Triage Notes (Signed)
Pt presents to uc with co of nausea, vomiting, diarrhea, cough, congestion, ha , and sore throat since Wednesday. Pt reports she has been taking otc musinex

## 2022-12-06 NOTE — ED Provider Notes (Signed)
[name]    CSN: [id number] Arrival date & time: 12/06/22  1308      History   Chief Complaint Chief Complaint  Patient presents with   Fever    headache   Cough   Emesis   Nausea    HPI [name] is a 47 y.o. female.   47 year old female presents with sore throat, fever, congestion, nausea and vomiting.  Patient indicates for the past 2 days she has been having upper respiratory congestion with rhinitis, postnasal drip, clear production.  Patient also indicates having sore throat and painful swallowing.  Patient relates having chest congestion and intermittent cough, clear production without wheezing or shortness of breath.  Patient also indicates having nausea, vomiting, and diarrhea over the past 2 days.  Patient indicates being fatigued, lethargic, having fever, chills, body aches and discomfort.  Patient is concerned about having COVID or flu.  She indicates her son has similar symptoms   Fever Associated symptoms: cough and vomiting   Cough Associated symptoms: fever   Emesis Associated symptoms: cough and fever     Past Medical History:  Diagnosis Date   Abscess 11/2012   RIGHT FINGER   Arthritis    Cataracts, bilateral    Chronic back pain    Diabetes mellitus    type 1   Diabetic neuropathy (HCC)    Herpes virus disease    Hip fracture ([place])    left side   History of blood transfusion 2013   left  hip- pinning   Shortness of breath dyspnea    with exertion   Urinary tract infection    hx of    Patient Active Problem List   Diagnosis Date Noted   Neuralgia 01/30/2022   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   S/P repeat low transverse C-section 09/10/2013   Cellulitis and abscess 12/22/2012   Diabetes ([place]) 11/25/2012    Past Surgical History:  Procedure Laterality Date   BILATERAL SALPINGECTOMY     CATARACT EXTRACTION     left   CATARACT EXTRACTION W/PHACO  06/12/2012   Procedure:  CATARACT EXTRACTION PHACO AND INTRAOCULAR LENS PLACEMENT ([place]);  Surgeon: Adonis Brook, MD;  Location: [place];  Service: Ophthalmology;  Laterality: Right;   CATARACT EXTRACTION W/PHACO  06/15/2012   Procedure: CATARACT EXTRACTION PHACO AND INTRAOCULAR LENS PLACEMENT ([facility]);  Surgeon: Adonis Brook, MD;  Location: [place];  Service: Ophthalmology;  Laterality: Left;   CESAREAN SECTION     x 3   CESAREAN SECTION WITH BILATERAL TUBAL LIGATION Bilateral 09/10/2013   Procedure: REPEAT CESAREAN SECTION WITH BILATERAL TUBAL LIGATION;  Surgeon: Delice Lesch, MD;  Location: [facility];  Service: Obstetrics;  Laterality: Bilateral;   EYE SURGERY Bilateral    FRACTURE SURGERY     surgery left hip   HAND SURGERY Right 11/2012   I & D EXTREMITY Right 11/23/2012   Procedure: IRRIGATION AND DEBRIDEMENT EXTREMITY;  Surgeon: Jolyn Nap, MD;  Location: [place];  Service: Orthopedics;  Laterality: Right;   I & D EXTREMITY Right 11/30/2012   Procedure: IRRIGATION AND DEBRIDEMENT Right hand Abscess;  Surgeon: Jolyn Nap, MD;  Location: [place];  Service: Orthopedics;  Laterality: Right;   TOTAL HIP ARTHROPLASTY Left 10/12/2014   Procedure: TOTAL HIP ARTHROPLASTY;  Surgeon: Newt Minion, MD;  Location: [place];  Service: Orthopedics;  Laterality: Left;    OB History     Gravida  4   Para  3   Term  1   Preterm      AB  1   Living  3      SAB      IAB      Ectopic      Multiple      Live Births  1            Home Medications    Prior to Admission medications   Medication Sig Start Date End Date Taking? Authorizing Provider  dextromethorphan-guaiFENesin (MUCINEX DM) 30-600 MG 12hr tablet Take 1 tablet by mouth 2 (two) times daily. 12/06/22  Yes Nyoka Lint, PA-C  ondansetron (ZOFRAN) 4 MG tablet Take 1 tablet (4 mg total) by mouth every 8 (eight) hours as needed for nausea or vomiting. 12/06/22  Yes Nyoka Lint, PA-C  oseltamivir (TAMIFLU) 75 MG capsule Take 1 capsule (75 mg total) by  mouth every 12 (twelve) hours. 12/06/22  Yes Nyoka Lint, PA-C  aspirin EC 325 MG tablet Take 1 tablet (325 mg total) by mouth daily. 10/12/14   Newt Minion, MD  benzonatate (TESSALON) 100 MG capsule Take 1-2 capsules (100-200 mg total) by mouth 3 (three) times daily as needed. 04/08/21   Jaynee Eagles, PA-C  cetirizine (ZYRTEC ALLERGY) 10 MG tablet Take 1 tablet (10 mg total) by mouth daily. 04/08/21   Jaynee Eagles, PA-C  cyclobenzaprine (FLEXERIL) 10 MG tablet Take 10 mg by mouth 3 (three) times daily as needed for muscle spasms.    [provider]  ferrous sulfate 325 (65 FE) MG tablet Take 325 mg by mouth daily with breakfast.    [provider]  fluconazole (DIFLUCAN) 200 MG tablet Take 1 tablet (200 mg total) by mouth daily. Patient taking differently: Take 200 mg by mouth as needed. 06/24/16   Kandis Cocking A, CNM  gabapentin (NEURONTIN) 100 MG capsule Take 100 mg by mouth 3 (three) times daily.    [provider]  ibuprofen (ADVIL,MOTRIN) 600 MG tablet Take 600 mg by mouth every 6 (six) hours as needed for mild pain.    [provider]  insulin aspart (NOVOLOG) 100 UNIT/ML injection Inject into the skin 3 (three) times daily before meals.    [provider]  insulin detemir (LEVEMIR) 100 UNIT/ML injection Inject 50 Units into the skin daily.     [provider]  Liraglutide 18 MG/3ML SOPN Inject 0.6 mLs into the skin at bedtime.    [provider]  meloxicam (MOBIC) 15 MG tablet Take 15 mg by mouth as needed for pain.    [provider]  metroNIDAZOLE (METROGEL VAGINAL) 0.75 % vaginal gel Place 1 Applicatorful vaginally 2 (two) times a week. For 4-6 months. 06/24/16   Kandis Cocking A, CNM  Multiple Vitamin (MULTIVITAMIN WITH MINERALS) TABS tablet Take 1 tablet by mouth daily.    [provider]  oxyCODONE-acetaminophen (ROXICET) 5-325 MG per tablet Take 1 tablet by mouth every 4 (four) hours as needed for severe  pain. 10/12/14   Newt Minion, MD  pseudoephedrine (SUDAFED) 60 MG tablet Take 1 tablet (60 mg total) by mouth every 8 (eight) hours as needed for congestion. 04/08/21   Jaynee Eagles, PA-C  simvastatin (ZOCOR) 10 MG tablet Take 10 mg by mouth daily.    [provider]  terconazole (TERAZOL 7) 0.4 % vaginal cream Place 1 applicator vaginally at bedtime. 06/24/16   Kandis Cocking A, CNM  valACYclovir (VALTREX) 500 MG tablet Take 1 tablet (500 mg total)  by mouth 2 (two) times daily. For 2 weeks. Patient taking differently: Take 500 mg by mouth as needed. For 2 weeks. 07/18/17   Morene Crocker, CNM  VENTOLIN HFA 108 (90 Base) MCG/ACT inhaler  01/23/20   [provider]  vitamin B-12 (CYANOCOBALAMIN) 100 MCG tablet Take 100 mcg by mouth daily.    [provider]  zolpidem (AMBIEN) 10 MG tablet Take 10 mg by mouth at bedtime as needed for sleep.    [provider]    Family History Family History  Problem Relation Age of Onset   Diabetes Mother    Heart disease Mother    Heart attack Father    Asthma Son    Healthy Son    Asthma Son    Healthy Son    Healthy Daughter     Social History Social History   Tobacco Use   Smoking status: Never   Smokeless tobacco: Never  Vaping Use   Vaping Use: Never used  Substance Use Topics   Alcohol use: No    Alcohol/week: 0.0 standard drinks of alcohol   Drug use: No     Allergies   Phenergan [promethazine hcl] and Tramadol   Review of Systems Review of Systems  Constitutional:  Positive for fever.  Respiratory:  Positive for cough.   Gastrointestinal:  Positive for vomiting.     Physical Exam Triage Vital Signs ED Triage Vitals  Enc Vitals Group     BP 12/06/22 1332 104/75     Pulse Rate 12/06/22 1332 93     Resp 12/06/22 1332 (!) 22     Temp 12/06/22 1332 98.7 F (37.1 C)     Temp Source 12/06/22 1332 Oral     SpO2 12/06/22 1332 100 %     Weight --      Height --      Head Circumference  --      Peak Flow --      Pain Score 12/06/22 1331 8     Pain Loc --      Pain Edu? --      Excl. in [place]? --    No data found.  Updated Vital Signs BP 104/75   Pulse 93   Temp 98.7 F (37.1 C) (Oral)   Resp (!) 22   LMP 11/24/2022   SpO2 100%   Visual Acuity Right Eye Distance:   Left Eye Distance:   Bilateral Distance:    Right Eye Near:   Left Eye Near:    Bilateral Near:     Physical Exam Constitutional:      Appearance: Normal appearance.  HENT:     Right Ear: Tympanic membrane and ear canal normal.     Left Ear: Tympanic membrane and ear canal normal.     Mouth/Throat:     Mouth: Mucous membranes are moist.     Pharynx: Oropharynx is clear.  Cardiovascular:     Rate and Rhythm: Normal rate and regular rhythm.     Heart sounds: Normal heart sounds.  Pulmonary:     Effort: Pulmonary effort is normal.     Breath sounds: Normal breath sounds and air entry. No wheezing, rhonchi or rales.  Abdominal:     General: Abdomen is flat. Bowel sounds are normal.     Palpations: Abdomen is soft.     Tenderness: There is no abdominal tenderness. There is no guarding or rebound.  Lymphadenopathy:     Cervical: No cervical adenopathy.  Neurological:     Mental Status: She is alert.      UC Treatments / Results  Labs (all labs ordered are listed, but only abnormal results are displayed) Labs Reviewed  POCT INFLUENZA A/B - Abnormal; Notable for the following components:      Result Value   Influenza A, POC Positive (*)    All other components within normal limits  SARS CORONAVIRUS 2 (TAT 6-24 HRS)  POCT RAPID STREP A (OFFICE)    EKG   Radiology No results found.  Procedures Procedures (including critical care time)  Medications Ordered in UC Medications  ondansetron (ZOFRAN-ODT) disintegrating tablet 4 mg (4 mg Oral Given 12/06/22 1358)    Initial Impression / Assessment and Plan / UC Course  I have reviewed the triage vital signs and the nursing  notes.  Pertinent labs & imaging results that were available during my care of the patient were reviewed by me and considered in my medical decision making (see chart for details).    Plan: The diagnosis will be treated with the following: 1.  Upper respiratory infection: A.  Advised to use Mucinex DM every 12 hours to treat cough and congestion. 2.  Fever: A.  Advise use ibuprofen or Motrin as needed for fever control. 3.  Sore throat: A.  Advised to use ibuprofen or Motrin along with gargles and lozenges to control the sore throat. 4.  Screening for COVID-19: A.  Treatment may be modified depending on results of the COVID test. 5. Flu: A.  Tamiflu 75 mg twice daily to treat the flu 6.  Advised follow-up PCP return to urgent care as needed. Final Clinical Impressions(s) / UC Diagnoses   Final diagnoses:  Acute upper respiratory infection  Fever, unspecified  Sore throat  Encounter for screening for COVID-19  Flu     Discharge Instructions      Take Zofran 4 mg every 8 hours on a regular basis to help decrease nausea, vomiting, and diarrhea. Advised take Mucinex DM every 12 hours to help control cough and congestion. Advised to increase fluid intake with clear liquids, avoid dark drinks, bland diet over the next 48 hours.  Advised follow-up PCP or return to urgent care as needed.    ED Prescriptions     Medication Sig Dispense Auth. Provider   ondansetron (ZOFRAN) 4 MG tablet Take 1 tablet (4 mg total) by mouth every 8 (eight) hours as needed for nausea or vomiting. 20 tablet Nyoka Lint, PA-C   dextromethorphan-guaiFENesin [facility] DM) 30-600 MG 12hr tablet Take 1 tablet by mouth 2 (two) times daily. 20 tablet Nyoka Lint, PA-C   oseltamivir (TAMIFLU) 75 MG capsule Take 1 capsule (75 mg total) by mouth every 12 (twelve) hours. 10 capsule Nyoka Lint, PA-C      PDMP not reviewed this encounter.   Nyoka Lint, PA-C 12/06/22 1429

## 2022-12-06 NOTE — Discharge Instructions (Addendum)
Take Zofran 4 mg every 8 hours on a regular basis to help decrease nausea, vomiting, and diarrhea. Advised take Mucinex DM every 12 hours to help control cough and congestion. Advised to increase fluid intake with clear liquids, avoid dark drinks, bland diet over the next 48 hours.  Advised follow-up PCP or return to urgent care as needed.

## 2022-12-07 LAB — SARS CORONAVIRUS 2 (TAT 6-24 HRS): SARS Coronavirus 2: NEGATIVE

## 2022-12-19 DIAGNOSIS — Z803 Family history of malignant neoplasm of breast: Secondary | ICD-10-CM | POA: Diagnosis not present

## 2022-12-19 DIAGNOSIS — N760 Acute vaginitis: Secondary | ICD-10-CM | POA: Diagnosis not present

## 2022-12-19 DIAGNOSIS — Z1231 Encounter for screening mammogram for malignant neoplasm of breast: Secondary | ICD-10-CM | POA: Diagnosis not present

## 2022-12-19 DIAGNOSIS — Z801 Family history of malignant neoplasm of trachea, bronchus and lung: Secondary | ICD-10-CM | POA: Diagnosis not present

## 2022-12-19 DIAGNOSIS — Z01419 Encounter for gynecological examination (general) (routine) without abnormal findings: Secondary | ICD-10-CM | POA: Diagnosis not present

## 2022-12-19 DIAGNOSIS — Z8041 Family history of malignant neoplasm of ovary: Secondary | ICD-10-CM | POA: Diagnosis not present

## 2022-12-19 DIAGNOSIS — Z113 Encounter for screening for infections with a predominantly sexual mode of transmission: Secondary | ICD-10-CM | POA: Diagnosis not present

## 2022-12-19 DIAGNOSIS — R87610 Atypical squamous cells of undetermined significance on cytologic smear of cervix (ASC-US): Secondary | ICD-10-CM | POA: Diagnosis not present

## 2022-12-19 DIAGNOSIS — Z1211 Encounter for screening for malignant neoplasm of colon: Secondary | ICD-10-CM | POA: Diagnosis not present

## 2022-12-19 DIAGNOSIS — R87612 Low grade squamous intraepithelial lesion on cytologic smear of cervix (LGSIL): Secondary | ICD-10-CM | POA: Diagnosis not present

## 2023-02-03 ENCOUNTER — Ambulatory Visit
Admission: EM | Admit: 2023-02-03 | Discharge: 2023-02-03 | Discharge status: Against Medical Advice | Payer: Medicare HMO

## 2023-02-13 ENCOUNTER — Emergency Department (HOSPITAL_COMMUNITY)
Admission: EM | Admit: 2023-02-13 | Discharge: 2023-02-14 | Disposition: A | Discharge status: Home / Self Care | Payer: Medicare HMO | Attending: Emergency Medicine | Admitting: Emergency Medicine

## 2023-02-13 DIAGNOSIS — R079 Chest pain, unspecified: Secondary | ICD-10-CM | POA: Diagnosis not present

## 2023-02-13 DIAGNOSIS — K8020 Calculus of gallbladder without cholecystitis without obstruction: Secondary | ICD-10-CM | POA: Insufficient documentation

## 2023-02-13 DIAGNOSIS — R0789 Other chest pain: Secondary | ICD-10-CM | POA: Diagnosis not present

## 2023-02-13 DIAGNOSIS — R1011 Right upper quadrant pain: Secondary | ICD-10-CM | POA: Diagnosis not present

## 2023-02-14 ENCOUNTER — Emergency Department (HOSPITAL_COMMUNITY): Payer: Medicare HMO

## 2023-02-14 ENCOUNTER — Encounter (HOSPITAL_COMMUNITY): Payer: Self-pay

## 2023-02-14 DIAGNOSIS — R1011 Right upper quadrant pain: Secondary | ICD-10-CM | POA: Diagnosis not present

## 2023-02-14 DIAGNOSIS — K8020 Calculus of gallbladder without cholecystitis without obstruction: Secondary | ICD-10-CM | POA: Diagnosis not present

## 2023-02-14 DIAGNOSIS — R079 Chest pain, unspecified: Secondary | ICD-10-CM | POA: Diagnosis not present

## 2023-02-14 LAB — COMPREHENSIVE METABOLIC PANEL
ALT: 9 U/L (ref 0–44)
AST: 16 U/L (ref 15–41)
Albumin: 3.6 g/dL (ref 3.5–5.0)
Alkaline Phosphatase: 43 U/L (ref 38–126)
Anion gap: 7 (ref 5–15)
BUN: 13 mg/dL (ref 6–20)
CO2: 26 mmol/L (ref 22–32)
Calcium: 9.2 mg/dL (ref 8.9–10.3)
Chloride: 102 mmol/L (ref 98–111)
Creatinine, Ser: 0.91 mg/dL (ref 0.44–1.00)
GFR, Estimated: >60 mL/min (ref >60)
Glucose, Bld: 159 mg/dL — ABNORMAL HIGH (ref 70–99)
Potassium: 4.1 mmol/L (ref 3.5–5.1)
Sodium: 135 mmol/L (ref 135–145)
Total Bilirubin: 0.3 mg/dL (ref 0.3–1.2)
Total Protein: 8 g/dL (ref 6.5–8.1)

## 2023-02-14 LAB — CBC
HCT: 34.8 % — ABNORMAL LOW (ref 36.0–46.0)
Hemoglobin: 11.1 g/dL — ABNORMAL LOW (ref 12.0–15.0)
MCH: 26.3 pg (ref 26.0–34.0)
MCHC: 31.9 g/dL (ref 30.0–36.0)
MCV: 82.5 fL (ref 80.0–100.0)
Platelets: 335 10*3/uL (ref 150–400)
RBC: 4.22 MIL/uL (ref 3.87–5.11)
RDW: 14.8 % (ref 11.5–15.5)
WBC: 11.9 10*3/uL — ABNORMAL HIGH (ref 4.0–10.5)
nRBC: 0 % (ref 0.0–0.2)

## 2023-02-14 LAB — I-STAT BETA HCG BLOOD, ED (MC, WL, AP ONLY): I-stat hCG, quantitative: <5 m[IU]/mL (ref <5)

## 2023-02-14 LAB — LIPASE, BLOOD: Lipase: 32 U/L (ref 11–51)

## 2023-02-14 MED ORDER — OXYCODONE-ACETAMINOPHEN 5-325 MG PO TABS: 1.0000 | ORAL_TABLET | Freq: Four times a day (QID) | ORAL | 0 refills | Status: DC | PRN

## 2023-02-14 MED ORDER — ONDANSETRON HCL 4 MG/2ML IJ SOLN
4.0000 mg | Freq: Once | INTRAMUSCULAR | Status: AC
  Administered 2023-02-14: 4 mg via INTRAVENOUS
  Filled 2023-02-14: qty 2

## 2023-02-14 MED ORDER — HYDROMORPHONE HCL 1 MG/ML IJ SOLN
0.5000 mg | Freq: Once | INTRAMUSCULAR | Status: AC
  Administered 2023-02-14: 0.5 mg via INTRAVENOUS
  Filled 2023-02-14: qty 1

## 2023-02-14 MED ORDER — ONDANSETRON 4 MG PO TBDP: 4.0000 mg | ORAL_TABLET | Freq: Three times a day (TID) | ORAL | 0 refills | Status: AC | PRN

## 2023-02-14 MED ORDER — SODIUM CHLORIDE 0.9 % IV BOLUS
1000.0000 mL | Freq: Once | INTRAVENOUS | Status: AC
  Administered 2023-02-14: 1000 mL via INTRAVENOUS

## 2023-02-14 NOTE — ED Provider Notes (Signed)
[facility]-EMERGENCY DEPT [facility] Emergency Department Provider Note MRN:  [id number]  Arrival date & time: 02/14/23     Chief Complaint   Chest Pain   History of Present Illness   [name] is a 47 y.o. year-old female presents to the ED with chief complaint of RUQ abdominal pain that radiates into her back that started tonight around 10pm.  She reports severe pain.  She reports associated vomiting.  She states that her symptoms are worsened with palpation.    History provided by patient.   Review of Systems  Pertinent positive and negative review of systems noted in HPI.    Physical Exam   Vitals:   02/14/23 0056  BP: (!) 173/79  Pulse: 73  Resp: 16  Temp: 98 F (36.7 C)  SpO2: 100%    CONSTITUTIONAL:  uncomfortable-appearing, NAD NEURO:  Alert and oriented x 3, CN 3-12 grossly intact EYES:  eyes equal and reactive ENT/NECK:  Supple, no stridor  CARDIO:  normal rate, regular rhythm, appears well-perfused  PULM:  No respiratory distress, CTAB GI/GU:  non-distended, RUQ TTP MSK/SPINE:  No gross deformities, no edema, moves all extremities  SKIN:  no rash, atraumatic   *Additional and/or pertinent findings included in MDM below  Diagnostic and Interventional Summary    EKG Interpretation  Date/Time:  Friday Feb 14 2023 00:04:49 EDT Ventricular Rate:  77 PR Interval:  145 QRS Duration: 90 QT Interval:  355 QTC Calculation: 402 R Axis:   75 Text Interpretation: Sinus rhythm Low voltage, precordial leads Probable anteroseptal infarct, old Confirmed by Zadie Rhine ([id number]) on 02/14/2023 12:14:42 AM       Labs Reviewed  CBC - Abnormal; Notable for the following components:      Result Value   WBC 11.9 (*)    Hemoglobin 11.1 (*)    HCT 34.8 (*)    All other components within normal limits  COMPREHENSIVE METABOLIC PANEL - Abnormal; Notable for the following components:   Glucose, Bld 159 (*)    All other components within normal limits   LIPASE, BLOOD  URINALYSIS, ROUTINE W REFLEX MICROSCOPIC  I-STAT BETA HCG BLOOD, ED ([facility], [facility], [facility] ONLY)    US Abdomen Limited RUQ (LIVER/GB)  Final Result    DG Chest Portable 1 View  Final Result      Medications  HYDROmorphone (DILAUDID) injection 0.5 mg (0.5 mg Intravenous Given 02/14/23 0103)  ondansetron (ZOFRAN) injection 4 mg (4 mg Intravenous Given 02/14/23 0104)  sodium chloride 0.9 % bolus 1,000 mL (1,000 mLs Intravenous New Bag/Given 02/14/23 0103)     Procedures  /  Critical Care Procedures  ED Course and Medical Decision Making  I have reviewed the triage vital signs, the nursing notes, and pertinent available records from the EMR.  Social Determinants Affecting Complexity of Care: Patient has no clinically significant social determinants affecting this chief complaint..   ED Course:    Medical Decision Making Patient here with right upper abdominal pain that radiates into her back.  Sudden onset tonight.  Has had associated nausea and vomiting.  Concern for gallbladder disease.  Will check ultrasound and check labs.  Will treat pain.  Will reassess.  Patient is feeling much better after pain medication.  She is no longer vomiting.  Amount and/or Complexity of Data Reviewed Labs: ordered.    Details: No elevated LFTs.  Normal lipase.  Doubt acute cholecystitis or choledocholithiasis. Radiology: ordered and independent interpretation performed.    Details: Gallstones seen, no gallbladder  wall thickening  Risk Prescription drug management. Decision regarding hospitalization.     Consultants: No consultations were needed in caring for this patient.   Treatment and Plan: I considered admission due to patient's initial presentation, but after considering the examination and diagnostic results, patient will not require admission and can be discharged with outpatient follow-up.    Final Clinical Impressions(s) / ED Diagnoses     ICD-10-CM   1. Calculus of  gallbladder without cholecystitis without obstruction  K80.20       ED Discharge Orders          Ordered    oxyCODONE-acetaminophen (PERCOCET) 5-325 MG tablet  Every 6 hours PRN        02/14/23 0228    ondansetron (ZOFRAN-ODT) 4 MG disintegrating tablet  Every 8 hours PRN        02/14/23 0228              Discharge Instructions Discussed with and Provided to Patient:   Discharge Instructions   None      Roxy Horseman, PA-C 02/14/23 0230    Zadie Rhine, MD 02/14/23 [id number]

## 2023-02-14 NOTE — ED Triage Notes (Signed)
Pt presents to ED for evaluation of chest pain with nausea/vomiting radiating to her back while laying in bed tonight.

## 2023-02-18 ENCOUNTER — Other Ambulatory Visit (HOSPITAL_COMMUNITY): Payer: Self-pay

## 2023-02-18 ENCOUNTER — Other Ambulatory Visit: Payer: Self-pay | Admitting: Internal Medicine

## 2023-02-18 DIAGNOSIS — E0842 Diabetes mellitus due to underlying condition with diabetic polyneuropathy: Secondary | ICD-10-CM | POA: Diagnosis not present

## 2023-02-18 DIAGNOSIS — Z1239 Encounter for other screening for malignant neoplasm of breast: Secondary | ICD-10-CM | POA: Diagnosis not present

## 2023-02-18 DIAGNOSIS — E559 Vitamin D deficiency, unspecified: Secondary | ICD-10-CM | POA: Diagnosis not present

## 2023-02-18 DIAGNOSIS — Z7189 Other specified counseling: Secondary | ICD-10-CM | POA: Diagnosis not present

## 2023-02-18 DIAGNOSIS — Z0000 Encounter for general adult medical examination without abnormal findings: Secondary | ICD-10-CM | POA: Diagnosis not present

## 2023-02-18 DIAGNOSIS — E1142 Type 2 diabetes mellitus with diabetic polyneuropathy: Secondary | ICD-10-CM | POA: Diagnosis not present

## 2023-02-18 DIAGNOSIS — K8080 Other cholelithiasis without obstruction: Secondary | ICD-10-CM | POA: Diagnosis not present

## 2023-02-18 DIAGNOSIS — Z6841 Body Mass Index (BMI) 40.0 and over, adult: Secondary | ICD-10-CM | POA: Diagnosis not present

## 2023-02-18 MED ORDER — MOUNJARO 12.5 MG/0.5ML ~~LOC~~ SOAJ
12.5000 mg | SUBCUTANEOUS | 2 refills | Status: AC
  Filled 2023-02-18: qty 4, 56d supply, fill #0

## 2023-02-19 ENCOUNTER — Telehealth: Payer: Self-pay

## 2023-02-19 LAB — CBC
HCT: 34.2 % — ABNORMAL LOW (ref 35.0–45.0)
Hemoglobin: 10.6 g/dL — ABNORMAL LOW (ref 11.7–15.5)
MCH: 26 pg — ABNORMAL LOW (ref 27.0–33.0)
MCHC: 31 g/dL — ABNORMAL LOW (ref 32.0–36.0)
MCV: 83.8 fL (ref 80.0–100.0)
MPV: 10.7 fL (ref 7.5–12.5)
Platelets: 330 10*3/uL (ref 140–400)
RBC: 4.08 10*6/uL (ref 3.80–5.10)
RDW: 14.2 % (ref 11.0–15.0)
WBC: 8.7 10*3/uL (ref 3.8–10.8)

## 2023-02-19 LAB — COMPLETE METABOLIC PANEL WITH GFR
AG Ratio: 1.2 (calc) (ref 1.0–2.5)
ALT: 8 U/L (ref 6–29)
AST: 16 U/L (ref 10–35)
Albumin: 4 g/dL (ref 3.6–5.1)
Alkaline phosphatase (APISO): 57 U/L (ref 31–125)
BUN/Creatinine Ratio: 20 (calc) (ref 6–22)
BUN: 20 mg/dL (ref 7–25)
CO2: 23 mmol/L (ref 20–32)
Calcium: 9.3 mg/dL (ref 8.6–10.2)
Chloride: 106 mmol/L (ref 98–110)
Creat: 1.01 mg/dL — ABNORMAL HIGH (ref 0.50–0.99)
Globulin: 3.4 g/dL (calc) (ref 1.9–3.7)
Glucose, Bld: 102 mg/dL — ABNORMAL HIGH (ref 65–99)
Potassium: 4.4 mmol/L (ref 3.5–5.3)
Sodium: 138 mmol/L (ref 135–146)
Total Bilirubin: 0.2 mg/dL (ref 0.2–1.2)
Total Protein: 7.4 g/dL (ref 6.1–8.1)
eGFR: 69 mL/min/{1.73_m2} (ref >=60)

## 2023-02-19 LAB — TSH: TSH: 1.78 mIU/L

## 2023-02-19 LAB — VITAMIN D 25 HYDROXY (VIT D DEFICIENCY, FRACTURES): Vit D, 25-Hydroxy: 32 ng/mL (ref 30–100)

## 2023-02-19 LAB — LIPID PANEL
Cholesterol: 157 mg/dL (ref <200)
HDL: 46 mg/dL — ABNORMAL LOW (ref >=50)
LDL Cholesterol (Calc): 96 mg/dL (calc)
Non-HDL Cholesterol (Calc): 111 mg/dL (calc) (ref <130)
Total CHOL/HDL Ratio: 3.4 (calc) (ref <5.0)
Triglycerides: 65 mg/dL (ref <150)

## 2023-02-19 LAB — VITAMIN B12: Vitamin B-12: 468 pg/mL (ref 200–1100)

## 2023-02-19 LAB — FOLATE: Folate: 6.4 ng/mL

## 2023-02-19 NOTE — Telephone Encounter (Signed)
Transition Care Management Follow-up Telephone Call Date of discharge and from where: 02/14/2023 [facility] How have you been since you were released from the hospital? Patient declined to participate  Marlina Cataldi [facility]  [facility] Population Health Community Resource Care Guide   [name]  ?? [phone]   Website: [email or web address]  [email or web address]

## 2023-03-18 ENCOUNTER — Other Ambulatory Visit: Payer: Self-pay | Admitting: Obstetrics and Gynecology

## 2023-03-25 ENCOUNTER — Other Ambulatory Visit: Payer: Self-pay

## 2023-03-25 ENCOUNTER — Encounter (HOSPITAL_COMMUNITY): Payer: Self-pay

## 2023-03-25 ENCOUNTER — Emergency Department (HOSPITAL_COMMUNITY)
Admission: EM | Admit: 2023-03-25 | Discharge: 2023-03-25 | Disposition: A | Discharge status: Home / Self Care | Payer: Medicare HMO | Attending: Emergency Medicine | Admitting: Emergency Medicine

## 2023-03-25 DIAGNOSIS — E162 Hypoglycemia, unspecified: Secondary | ICD-10-CM

## 2023-03-25 DIAGNOSIS — E11649 Type 2 diabetes mellitus with hypoglycemia without coma: Secondary | ICD-10-CM | POA: Diagnosis not present

## 2023-03-25 DIAGNOSIS — Z794 Long term (current) use of insulin: Secondary | ICD-10-CM | POA: Insufficient documentation

## 2023-03-25 DIAGNOSIS — Z7982 Long term (current) use of aspirin: Secondary | ICD-10-CM | POA: Insufficient documentation

## 2023-03-25 LAB — CBC WITH DIFFERENTIAL/PLATELET
Abs Immature Granulocytes: 0.04 10*3/uL (ref 0.00–0.07)
Basophils Absolute: 0 10*3/uL (ref 0.0–0.1)
Basophils Relative: 1 %
Eosinophils Absolute: 0.1 10*3/uL (ref 0.0–0.5)
Eosinophils Relative: 1 %
HCT: 35.2 % — ABNORMAL LOW (ref 36.0–46.0)
Hemoglobin: 11 g/dL — ABNORMAL LOW (ref 12.0–15.0)
Immature Granulocytes: 1 %
Lymphocytes Relative: 23 %
Lymphs Abs: 1.9 10*3/uL (ref 0.7–4.0)
MCH: 25.8 pg — ABNORMAL LOW (ref 26.0–34.0)
MCHC: 31.3 g/dL (ref 30.0–36.0)
MCV: 82.6 fL (ref 80.0–100.0)
Monocytes Absolute: 0.5 10*3/uL (ref 0.1–1.0)
Monocytes Relative: 7 %
Neutro Abs: 5.7 10*3/uL (ref 1.7–7.7)
Neutrophils Relative %: 67 %
Platelets: 384 10*3/uL (ref 150–400)
RBC: 4.26 MIL/uL (ref 3.87–5.11)
RDW: 14.5 % (ref 11.5–15.5)
WBC: 8.3 10*3/uL (ref 4.0–10.5)
nRBC: 0 % (ref 0.0–0.2)

## 2023-03-25 LAB — BASIC METABOLIC PANEL
Anion gap: 7 (ref 5–15)
BUN: 17 mg/dL (ref 6–20)
CO2: 26 mmol/L (ref 22–32)
Calcium: 8.9 mg/dL (ref 8.9–10.3)
Chloride: 102 mmol/L (ref 98–111)
Creatinine, Ser: 0.9 mg/dL (ref 0.44–1.00)
GFR, Estimated: >60 mL/min (ref >60)
Glucose, Bld: 125 mg/dL — ABNORMAL HIGH (ref 70–99)
Potassium: 3.9 mmol/L (ref 3.5–5.1)
Sodium: 135 mmol/L (ref 135–145)

## 2023-03-25 LAB — HEPATIC FUNCTION PANEL
ALT: 13 U/L (ref 0–44)
AST: 20 U/L (ref 15–41)
Albumin: 3.9 g/dL (ref 3.5–5.0)
Alkaline Phosphatase: 50 U/L (ref 38–126)
Bilirubin, Direct: <0.1 mg/dL (ref 0.0–0.2)
Total Bilirubin: 0.3 mg/dL (ref 0.3–1.2)
Total Protein: 8.5 g/dL — ABNORMAL HIGH (ref 6.5–8.1)

## 2023-03-25 LAB — CBG MONITORING, ED
Glucose-Capillary: 88 mg/dL (ref 70–99)
Glucose-Capillary: 81 mg/dL (ref 70–99)

## 2023-03-25 LAB — HCG, SERUM, QUALITATIVE: Preg, Serum: NEGATIVE

## 2023-03-25 NOTE — ED Provider Notes (Signed)
  Physical Exam  BP 111/85   Pulse 71   Temp 98.3 F (36.8 C) (Oral)   Resp 18   LMP 02/27/2023 (Approximate)   SpO2 99%   Physical Exam Constitutional:      Appearance: Normal appearance.  Cardiovascular:     Rate and Rhythm: Normal rate and regular rhythm.  Pulmonary:     Effort: Pulmonary effort is normal.     Breath sounds: Normal breath sounds.  Abdominal:     General: Abdomen is flat. Bowel sounds are normal.     Palpations: Abdomen is soft.  Neurological:     General: No focal deficit present.     Mental Status: She is alert.  Psychiatric:        Mood and Affect: Mood normal.      Procedures  Procedures  ED Course / MDM    Medical Decision Making Amount and/or Complexity of Data Reviewed Labs: ordered.  47 y.o. female with pertinent past medical history of type 2 diabetes on insulin and Mounjaro presents to the ED for concern of hypoglycemia  Differential diagnosis includes but is not limited to hypoglycemia secondary to infectious process, hypoglycemia secondary to poor intake  ED Course:  Patient was handed off to me by Army Melia, PA with plan to discharged to  her primary care appointment this morning at 10:00 if labs look unremarkable and sugars stable. Upon re-evaluation, patient CBG remain in the 80's, she is eating now.  No white blood cell count that would be concerning for infectious cause of her hypoglycemia.  Hemoglobin at 11 but consistent with baseline.  Impression: Hypoglycemia  Disposition:  The patient was discharged home with instructions to attend her primary care appointment today at 10 AM to discuss this episode of hypoglycemia.  Discussed that she needs to continue to eat regular meals and snacks to prevent her blood sugars from getting low.   Return precautions given.  Lab Tests: I Ordered, and personally interpreted labs.  The pertinent results include:   CBC with no leukocytosis BMP with glucose of 125  Imaging Studies  ordered: Not indicated   Cardiac Monitoring: / EKG: Not indicated   Consultations Obtained: None   Co morbidities that complicate the patient evaluation  Diabetes  Social Determinants of Health:  unknown             Arabella Merles, Cordelia Poche 03/25/23 1610    Rolan Bucco, MD 03/25/23 1425

## 2023-03-25 NOTE — Discharge Instructions (Addendum)
Please attend your PCP appointment today at 10AM and discuss this episode of hypoglycemia with them.  Continue to eat frequent meals to keep your blood sugar from dropping.   Return to the ER if you feel dizzy, you lose consciousness, you have palpitations, chest pain, or any other concerning symptoms.

## 2023-03-25 NOTE — ED Notes (Signed)
Tolerated breakfast tray. No signs of distress

## 2023-03-25 NOTE — ED Triage Notes (Signed)
Pt called EMS because she had generalized malaise all day and at 0300 she checked her CBG and it was 60. Pt ate something in an attempt to correct value and vomited x 1. EMS reports CBG was 77 upon their arrival.

## 2023-03-25 NOTE — ED Notes (Signed)
Lav, Red top sent as saves.

## 2023-03-25 NOTE — ED Provider Notes (Signed)
[place] EMERGENCY DEPARTMENT AT [facility] Provider Note   CSN: [id number] Arrival date & time: 03/25/23  7829     History  Chief Complaint  Patient presents with   Hypoglycemia    [name] is a 47 y.o. female.  47 yo female brought in by EMS from home for hypoglycemia. Patient states she was not feeling well yesterday, woke up at 3am today to her alarm for hypoglycemia with CBG of 60. Patient tried to drink and eat but her glucose continued to drop. She called EMS who evaluated her and left, she then called back when she was still unable to maintain her glucose after eating eggs and peanut butter. CBG with EMS of 77, 88 on arrival in the ER. States she has been trying to watch what she eats due to having gallstones, denies abdominal pain currently.  Took her Mounjaro injection on Sunday as scheduled. Compliant with her insulin. No fevers. Did have 1 episode of vomiting.        Home Medications Prior to Admission medications   Medication Sig Start Date End Date Taking? Authorizing Provider  aspirin EC 325 MG tablet Take 1 tablet (325 mg total) by mouth daily. 10/12/14   Nadara Mustard, MD  benzonatate (TESSALON) 100 MG capsule Take 1-2 capsules (100-200 mg total) by mouth 3 (three) times daily as needed. 04/08/21   Wallis Bamberg, PA-C  cetirizine (ZYRTEC ALLERGY) 10 MG tablet Take 1 tablet (10 mg total) by mouth daily. 04/08/21   Wallis Bamberg, PA-C  cyclobenzaprine (FLEXERIL) 10 MG tablet Take 10 mg by mouth 3 (three) times daily as needed for muscle spasms.    [provider]  dextromethorphan-guaiFENesin (MUCINEX DM) 30-600 MG 12hr tablet Take 1 tablet by mouth 2 (two) times daily. 12/06/22   Ellsworth Lennox, PA-C  ferrous sulfate 325 (65 FE) MG tablet Take 325 mg by mouth daily with breakfast.    [provider]  fluconazole (DIFLUCAN) 200 MG tablet Take 1 tablet (200 mg total) by mouth daily. Patient taking differently: Take 200 mg by mouth as  needed. 06/24/16   Orvilla Cornwall A, CNM  gabapentin (NEURONTIN) 100 MG capsule Take 100 mg by mouth 3 (three) times daily.    [provider]  ibuprofen (ADVIL,MOTRIN) 600 MG tablet Take 600 mg by mouth every 6 (six) hours as needed for mild pain.    [provider]  insulin aspart (NOVOLOG) 100 UNIT/ML injection Inject into the skin 3 (three) times daily before meals.    [provider]  insulin detemir (LEVEMIR) 100 UNIT/ML injection Inject 50 Units into the skin daily.     [provider]  Liraglutide 18 MG/3ML SOPN Inject 0.6 mLs into the skin at bedtime.    [provider]  meloxicam (MOBIC) 15 MG tablet Take 15 mg by mouth as needed for pain.    [provider]  metroNIDAZOLE (METROGEL VAGINAL) 0.75 % vaginal gel Place 1 Applicatorful vaginally 2 (two) times a week. For 4-6 months. 06/24/16   Orvilla Cornwall A, CNM  Multiple Vitamin (MULTIVITAMIN WITH MINERALS) TABS tablet Take 1 tablet by mouth daily.    [provider]  ondansetron (ZOFRAN) 4 MG tablet Take 1 tablet (4 mg total) by mouth every 8 (eight) hours as needed for nausea or vomiting. 12/06/22   Ellsworth Lennox, PA-C  ondansetron (ZOFRAN-ODT) 4 MG disintegrating tablet Take 1 tablet (4 mg total) by mouth every 8 (eight) hours as needed for nausea or vomiting.  02/14/23   Roxy Horseman, PA-C  oseltamivir (TAMIFLU) 75 MG capsule Take 1 capsule (75 mg total) by mouth every 12 (twelve) hours. 12/06/22   Ellsworth Lennox, PA-C  oxyCODONE-acetaminophen (PERCOCET) 5-325 MG tablet Take 1-2 tablets by mouth every 6 (six) hours as needed. 02/14/23   Roxy Horseman, PA-C  pseudoephedrine (SUDAFED) 60 MG tablet Take 1 tablet (60 mg total) by mouth every 8 (eight) hours as needed for congestion. 04/08/21   Wallis Bamberg, PA-C  simvastatin (ZOCOR) 10 MG tablet Take 10 mg by mouth daily.    [provider]  terconazole (TERAZOL 7) 0.4 % vaginal cream Place 1 applicator vaginally at  bedtime. 06/24/16   Orvilla Cornwall A, CNM  tirzepatide (MOUNJARO) 12.5 MG/0.5ML Pen Inject 12.5 mg into the skin once a week. 02/18/23     valACYclovir (VALTREX) 500 MG tablet Take 1 tablet (500 mg total) by mouth 2 (two) times daily. For 2 weeks. Patient taking differently: Take 500 mg by mouth as needed. For 2 weeks. 07/18/17   Roe Coombs, CNM  VENTOLIN HFA 108 (90 Base) MCG/ACT inhaler  01/23/20   [provider]  vitamin B-12 (CYANOCOBALAMIN) 100 MCG tablet Take 100 mcg by mouth daily.    [provider]  zolpidem (AMBIEN) 10 MG tablet Take 10 mg by mouth at bedtime as needed for sleep.    [provider]      Allergies    Phenergan [promethazine hcl] and Tramadol    Review of Systems   Review of Systems Negative except as per HPI Physical Exam Updated Vital Signs BP 111/85   Pulse 71   Temp 98.3 F (36.8 C) (Oral)   Resp 18   LMP 02/27/2023 (Approximate)   SpO2 99%  Physical Exam Vitals and nursing note reviewed.  Constitutional:      General: She is not in acute distress.    Appearance: She is well-developed. She is not diaphoretic.  HENT:     Head: Normocephalic and atraumatic.  Cardiovascular:     Rate and Rhythm: Normal rate and regular rhythm.     Heart sounds: Normal heart sounds.  Pulmonary:     Effort: Pulmonary effort is normal.     Breath sounds: Normal breath sounds.  Abdominal:     Palpations: Abdomen is soft.     Tenderness: There is no abdominal tenderness.  Skin:    General: Skin is warm and dry.     Findings: No erythema or rash.  Neurological:     Mental Status: She is alert and oriented to person, place, and time.  Psychiatric:        Behavior: Behavior normal.     ED Results / Procedures / Treatments   Labs (all labs ordered are listed, but only abnormal results are displayed) Labs Reviewed  BASIC METABOLIC PANEL - Abnormal; Notable for the following components:      Result Value   Glucose, Bld 125 (*)     All other components within normal limits  HCG, SERUM, QUALITATIVE  CBC WITH DIFFERENTIAL/PLATELET  HEPATIC FUNCTION PANEL  CBG MONITORING, ED  CBG MONITORING, ED  CBG MONITORING, ED  CBG MONITORING, ED  CBG MONITORING, ED    EKG None  Radiology No results found.  Procedures Procedures    Medications Ordered in ED Medications - No data to display  ED Course/ Medical Decision Making/ A&P  Medical Decision Making Amount and/or Complexity of Data Reviewed Labs: ordered.   This patient presents to the ED for concern of hypoglycemia, this involves an extensive number of treatment options, and is a complaint that carries with it a high risk of complications and morbidity.  The differential diagnosis includes but not limited to insulin use, infection resulting in hypoglycemia    Co morbidities that complicate the patient evaluation  Insulin dependent diabetes, cholelithiasis    Additional history obtained:  External records from outside source obtained and reviewed including recent visit to general surgery for cholelithiasis Additional history as per EMS in HPI.   Lab Tests:  I Ordered, and personally interpreted labs.  The pertinent results include: CBG 88, BMP with glucose of 125.  hCG negative.  Many labs including CBC and hepatic function pending.  Problem List / ED Course / Critical interventions / Medication management  47 year old female with insulin-dependent diabetes brought in by EMS from home with concern for hypoglycemia.  Patient did not feel well yesterday, has not been eating per usual due to recent diagnosis of gallstones.  She denies abdominal pain today and abdomen is soft and nontender.  Her glucose at home was as low as 60, she has been eating eggs and peanut butter among other things trying to raise her blood sugar without success.  BMP with glucose of 125.  Patient has her monitor on, will continue to watch her blood  sugar.  Will obtain labs and reassess when labs complete.  Patient is scheduled follow-up with her PCP at 10 AM today. I ordered medication including food for hypoglycemia I have reviewed the patients home medicines and have made adjustments as needed   Social Determinants of Health:  Has PCP, appointment scheduled today at 10 AM.   Test / Admission - Considered:  Disposition pending at time of signout to oncoming provider         Final Clinical Impression(s) / ED Diagnoses Final diagnoses:  Hypoglycemia    Rx / DC Orders ED Discharge Orders     None         Jeannie Fend, PA-C 03/25/23 [id number]    Gilda Crease, MD 03/25/23 [phone]

## 2023-03-26 ENCOUNTER — Ambulatory Visit: Payer: Self-pay | Admitting: Surgery

## 2023-03-31 ENCOUNTER — Telehealth: Payer: Self-pay

## 2023-03-31 NOTE — Telephone Encounter (Signed)
Transition Care Management Follow-up Telephone Call Date of discharge and from where: 03/25/2023 [facility] How have you been since you were released from the hospital? Patient stated she feels better, but declined to participate.  Mcguire Gasparyan [facility]  [facility] Population Health Community Resource Care Guide   [email or web address]  ?? [phone]   Website: [email or web address]  [email or web address]

## 2023-04-23 NOTE — Progress Notes (Signed)
Surgical Instructions   Your procedure is scheduled on Wednesday April 30, 2023. Report to [facility] Main Entrance "A" at 11:00 A.M., then check in with the Admitting office. Any questions or running late day of surgery: call [phone]  Questions prior to your surgery date: call [phone], Monday-Friday, 8am-4pm. If you experience any cold or flu symptoms such as cough, fever, chills, shortness of breath, etc. between now and your scheduled surgery, please notify us at the above number.     Remember:  Do not eat after midnight the night before your surgery   You may drink clear liquids until 10:00 the morning of your surgery.   Clear liquids allowed are: Water, Non-Citrus Juices (without pulp), Carbonated Beverages, Clear Tea, Black Coffee Only (NO MILK, CREAM OR POWDERED CREAMER of any kind), and Gatorade.    Take these medicines the morning of surgery with A SIP OF WATER  gabapentin (NEURONTIN)  simvastatin (ZOCOR)   May take these medicines IF NEEDED: cyclobenzaprine (FLEXERIL)  ondansetron (ZOFRAN-ODT)  oxyCODONE-acetaminophen (PERCOCET)    One week prior to surgery, STOP taking any Aspirin (unless otherwise instructed by your surgeon) Aleve, Naproxen, Ibuprofen, Motrin, Advil, Goody's, BC's, all herbal medications, fish oil, and non-prescription vitamins.  This includes your meloxicam (MOBIC).    WHAT DO I DO ABOUT MY DIABETES MEDICATION?   Do not take oral diabetes medicines (pills) the morning of surgery.   THE MORNING OF SURGERY, use 25 units of insulin degludec (TRESIBA FLEXTOUCH).  (This is 50% of your regular dose).   THE MORNING OF SURGERY DO NOT USE YOUR insulin aspart (NOVOLOG).   The day of surgery, do not take other diabetes injectables, including Byetta (exenatide), Bydureon (exenatide ER), Victoza (liraglutide), or Trulicity (dulaglutide).  If your CBG is greater than 220 mg/dL, you may take  of your sliding scale (correction) dose of  insulin.   HOW TO MANAGE YOUR DIABETES BEFORE AND AFTER SURGERY  Why is it important to control my blood sugar before and after surgery? Improving blood sugar levels before and after surgery helps healing and can limit problems. A way of improving blood sugar control is eating a healthy diet by:  Eating less sugar and carbohydrates  Increasing activity/exercise  Talking with your doctor about reaching your blood sugar goals High blood sugars (greater than 180 mg/dL) can raise your risk of infections and slow your recovery, so you will need to focus on controlling your diabetes during the weeks before surgery. Make sure that the doctor who takes care of your diabetes knows about your planned surgery including the date and location.  How do I manage my blood sugar before surgery? Check your blood sugar at least 4 times a day, starting 2 days before surgery, to make sure that the level is not too high or low.  Check your blood sugar the morning of your surgery when you wake up and every 2 hours until you get to the Short Stay unit.  If your blood sugar is less than 70 mg/dL, you will need to treat for low blood sugar: Do not take insulin. Treat a low blood sugar (less than 70 mg/dL) with  cup of clear juice (cranberry or apple), 4 glucose tablets, OR glucose gel. Recheck blood sugar in 15 minutes after treatment (to make sure it is greater than 70 mg/dL). If your blood sugar is not greater than 70 mg/dL on recheck, call [phone] for further instructions. Report your blood sugar to the short stay nurse when you  get to Short Stay.  If you are admitted to the hospital after surgery: Your blood sugar will be checked by the staff and you will probably be given insulin after surgery (instead of oral diabetes medicines) to make sure you have good blood sugar levels. The goal for blood sugar control after surgery is 80-180 mg/dL.                      Do NOT Smoke (Tobacco/Vaping) for 24  hours prior to your procedure.  If you use a CPAP at night, you may bring your mask/headgear for your overnight stay.   You will be asked to remove any contacts, glasses, piercing's, hearing aid's, dentures/partials prior to surgery. Please bring cases for these items if needed.    Patients discharged the day of surgery will not be allowed to drive home, and someone needs to stay with them for 24 hours.  SURGICAL WAITING ROOM VISITATION Patients may have no more than 2 support people in the waiting area - these visitors may rotate.   Pre-op nurse will coordinate an appropriate time for 1 ADULT support person, who may not rotate, to accompany patient in pre-op.  Children under the age of 25 must have an adult with them who is not the patient and must remain in the main waiting area with an adult.  If the patient needs to stay at the hospital during part of their recovery, the visitor guidelines for inpatient rooms apply.  Please refer to the [facility] website for the visitor guidelines for any additional information.   If you received a COVID test during your pre-op visit  it is requested that you wear a mask when out in public, stay away from anyone that may not be feeling well and notify your surgeon if you develop symptoms. If you have been in contact with anyone that has tested positive in the last 10 days please notify you surgeon.      Pre-operative CHG Bathing Instructions   You can play a key role in reducing the risk of infection after surgery. Your skin needs to be as free of germs as possible. You can reduce the number of germs on your skin by washing with CHG (chlorhexidine gluconate) soap before surgery. CHG is an antiseptic soap that kills germs and continues to kill germs even after washing.   DO NOT use if you have an allergy to chlorhexidine/CHG or antibacterial soaps. If your skin becomes reddened or irritated, stop using the CHG and notify one of our RNs at  [phone].              TAKE A SHOWER THE NIGHT BEFORE SURGERY AND THE DAY OF SURGERY    Please keep in mind the following:  DO NOT shave, including legs and underarms, 48 hours prior to surgery.   You may shave your face before/day of surgery.  Place clean sheets on your bed the night before surgery Use a clean washcloth (not used since being washed) for each shower. DO NOT sleep with pet's night before surgery.  CHG Shower Instructions:  If you choose to wash your hair and private area, wash first with your normal shampoo/soap.  After you use shampoo/soap, rinse your hair and body thoroughly to remove shampoo/soap residue.  Turn the water OFF and apply half the bottle of CHG soap to a CLEAN washcloth.  Apply CHG soap ONLY FROM YOUR NECK DOWN TO YOUR TOES (washing for 3-5 minutes)  DO NOT use CHG soap on face, private areas, open wounds, or sores.  Pay special attention to the area where your surgery is being performed.  If you are having back surgery, having someone wash your back for you may be helpful. Wait 2 minutes after CHG soap is applied, then you may rinse off the CHG soap.  Pat dry with a clean towel  Put on clean pajamas    Additional instructions for the day of surgery: DO NOT APPLY any lotions, deodorants or perfumes.   Do not wear jewelry or makeup Do not wear nail polish, gel polish, artificial nails, or any other type of covering on natural nails (fingers and toes) Do not bring valuables to the hospital. [facility] is not responsible for valuables/personal belongings. Put on clean/comfortable clothes.  Please brush your teeth.  Ask your nurse before applying any prescription medications to the skin.

## 2023-04-24 ENCOUNTER — Encounter (HOSPITAL_COMMUNITY)
Admission: RE | Admit: 2023-04-24 | Discharge: 2023-04-24 | Disposition: A | Discharge status: Home / Self Care | Payer: Medicare HMO | Source: Ambulatory Visit | Attending: Surgery | Admitting: Surgery

## 2023-04-24 ENCOUNTER — Other Ambulatory Visit: Payer: Self-pay

## 2023-04-24 ENCOUNTER — Encounter (HOSPITAL_COMMUNITY): Payer: Self-pay

## 2023-04-24 VITALS — BP 112/93 | HR 72 | Resp 18 | Ht 62.0 in | Wt 227.0 lb

## 2023-04-24 DIAGNOSIS — Z01812 Encounter for preprocedural laboratory examination: Secondary | ICD-10-CM | POA: Insufficient documentation

## 2023-04-24 DIAGNOSIS — Z01818 Encounter for other preprocedural examination: Secondary | ICD-10-CM

## 2023-04-24 HISTORY — DX: Anxiety disorder, unspecified: F41.9

## 2023-04-24 HISTORY — DX: Anemia, unspecified: D64.9

## 2023-04-24 HISTORY — DX: Other complications of anesthesia, initial encounter: T88.59XA

## 2023-04-24 HISTORY — DX: Myoneural disorder, unspecified: G70.9

## 2023-04-24 LAB — CBC
HCT: 36.1 % (ref 36.0–46.0)
Hemoglobin: 11.2 g/dL — ABNORMAL LOW (ref 12.0–15.0)
MCH: 25.2 pg — ABNORMAL LOW (ref 26.0–34.0)
MCHC: 31 g/dL (ref 30.0–36.0)
MCV: 81.3 fL (ref 80.0–100.0)
Platelets: 339 10*3/uL (ref 150–400)
RBC: 4.44 MIL/uL (ref 3.87–5.11)
RDW: 14.3 % (ref 11.5–15.5)
WBC: 6.9 10*3/uL (ref 4.0–10.5)
nRBC: 0 % (ref 0.0–0.2)

## 2023-04-24 LAB — BASIC METABOLIC PANEL
Anion gap: 9 (ref 5–15)
BUN: 10 mg/dL (ref 6–20)
CO2: 26 mmol/L (ref 22–32)
Calcium: 9.2 mg/dL (ref 8.9–10.3)
Chloride: 103 mmol/L (ref 98–111)
Creatinine, Ser: 0.85 mg/dL (ref 0.44–1.00)
GFR, Estimated: >60 mL/min (ref >60)
Glucose, Bld: 167 mg/dL — ABNORMAL HIGH (ref 70–99)
Potassium: 4.2 mmol/L (ref 3.5–5.1)
Sodium: 138 mmol/L (ref 135–145)

## 2023-04-24 LAB — HEMOGLOBIN A1C
Hgb A1c MFr Bld: 7 % — ABNORMAL HIGH (ref 4.8–5.6)
Mean Plasma Glucose: 154.2 mg/dL

## 2023-04-24 LAB — GLUCOSE, CAPILLARY: Glucose-Capillary: 161 mg/dL — ABNORMAL HIGH (ref 70–99)

## 2023-04-24 NOTE — Progress Notes (Signed)
Surgical Instructions  Your procedure is scheduled on Wednesday April 30, 2023 at 1:00 PM. Report to [facility] Main Entrance "A" at 11:00 AM,  then check in with the Admitting office. Any questions or running late day of surgery: call [phone]  Questions prior to your surgery date: call [phone], Monday-Friday, 8am-4pm. If you experience any cold or flu symptoms such as cough, fever, chills, shortness of breath, etc. between now and your scheduled surgery, please notify us at the above number.    Remember:  Do not eat after midnight the night before your surgery  You may drink clear liquids until 10:00 the morning of your surgery.   Clear liquids allowed are: Water, Non-Citrus Juices (without pulp), Carbonated Beverages, Clear Tea, Black Coffee Only (NO MILK, CREAM OR POWDERED CREAMER of any kind), and Gatorade.   Take these medicines the morning of surgery with A SIP OF WATER  gabapentin (NEURONTIN)  simvastatin (ZOCOR)   May take these medicines IF NEEDED: cyclobenzaprine (FLEXERIL)  ondansetron (ZOFRAN-ODT)  oxyCODONE-acetaminophen (PERCOCET)    As of today, STOP taking any Aspirin (unless otherwise instructed by your surgeon) Aleve, Naproxen, Ibuprofen, Motrin, Advil, Goody's, BC's, all herbal medications, fish oil and non-prescription vitamins. This includes your meloxicam (MOBIC).    WHAT DO I DO ABOUT MY DIABETES MEDICATION?  Do NOT take [facility] Sunday, August 11th prior to surgery.  Do not take oral diabetes medicines (pills) the morning of surgery.  THE MORNING OF SURGERY,  take 40 units of insulin degludec (TRESIBA FLEXTOUCH).  (This is 80% of your regular dose).   The day of surgery, do not take other diabetes injectables, including Byetta (exenatide), Bydureon (exenatide ER), Victoza (liraglutide), or Trulicity (dulaglutide).  If your CBG is greater than 220 mg/dL, you may take  of your sliding scale (correction) dose of insulin (Novolog)   HOW TO  MANAGE YOUR DIABETES BEFORE AND AFTER SURGERY  Why is it important to control my blood sugar before and after surgery? Improving blood sugar levels before and after surgery helps healing and can limit problems. A way of improving blood sugar control is eating a healthy diet by:  Eating less sugar and carbohydrates  Increasing activity/exercise  Talking with your doctor about reaching your blood sugar goals High blood sugars (greater than 180 mg/dL) can raise your risk of infections and slow your recovery, so you will need to focus on controlling your diabetes during the weeks before surgery. Make sure that the doctor who takes care of your diabetes knows about your planned surgery including the date and location.  How do I manage my blood sugar before surgery? Check your blood sugar at least 4 times a day, starting 2 days before surgery, to make sure that the level is not too high or low.  Check your blood sugar the morning of your surgery when you wake up and every 2 hours until you get to the Short Stay unit.  If your blood sugar is less than 70 mg/dL, you will need to treat for low blood sugar: Do not take insulin. Treat a low blood sugar (less than 70 mg/dL) with  cup of clear juice (cranberry or apple), 4 glucose tablets, OR glucose gel. Recheck blood sugar in 15 minutes after treatment (to make sure it is greater than 70 mg/dL). If your blood sugar is not greater than 70 mg/dL on recheck, call [phone] for further instructions. Report your blood sugar to the short stay nurse when you get to Short Stay.  If you are admitted to the hospital after surgery: Your blood sugar will be checked by the staff and you will probably be given insulin after surgery (instead of oral diabetes medicines) to make sure you have good blood sugar levels. The goal for blood sugar control after surgery is 80-180 mg/dL.           You will be asked to remove any contacts, glasses, piercing's, hearing  aid's, dentures/partials prior to surgery. Please bring cases for these items if needed.    Patients discharged the day of surgery will not be allowed to drive home, and someone needs to stay with them for 24 hours.  SURGICAL WAITING ROOM VISITATION Patients may have no more than 2 support people in the waiting area - these visitors may rotate.   Pre-op nurse will coordinate an appropriate time for 1 ADULT support person, who may not rotate, to accompany patient in pre-op.  Children under the age of 69 must have an adult with them who is not the patient and must remain in the main waiting area with an adult.  If the patient needs to stay at the hospital during part of their recovery, the visitor guidelines for inpatient rooms apply.  Please refer to the [facility] website for the visitor guidelines for any additional information.   If you have been in contact with anyone that has tested positive for Covid in the last 10 days please notify you surgeon.      Pre-operative CHG Bathing Instructions   You can play a key role in reducing the risk of infection after surgery. Your skin needs to be as free of germs as possible. You can reduce the number of germs on your skin by washing with CHG (chlorhexidine gluconate) soap before surgery. CHG is an antiseptic soap that kills germs and continues to kill germs even after washing.   DO NOT use if you have an allergy to chlorhexidine/CHG or antibacterial soaps. If your skin becomes reddened or irritated, stop using the CHG and notify one of our RNs at [phone].              TAKE A SHOWER THE NIGHT BEFORE SURGERY AND THE DAY OF SURGERY    Please keep in mind the following:  DO NOT shave, including legs and underarms, 48 hours prior to surgery.   You may shave your face before/day of surgery.  Place clean sheets on your bed the night before surgery Use a clean washcloth (not used since being washed) for each shower. DO NOT sleep with pet's  night before surgery.  CHG Shower Instructions:  If you choose to wash your hair and private area, wash first with your normal shampoo/soap.  After you use shampoo/soap, rinse your hair and body thoroughly to remove shampoo/soap residue.  Turn the water OFF and apply half the bottle of CHG soap to a CLEAN washcloth.  Apply CHG soap ONLY FROM YOUR NECK DOWN TO YOUR TOES (washing for 3-5 minutes)  DO NOT use CHG soap on face, private areas, open wounds, or sores.  Pay special attention to the area where your surgery is being performed.  Wait 2 minutes after CHG soap is applied, then you may rinse off the CHG soap.  Pat dry with a clean towel  Put on clean pajamas    Additional instructions for the day of surgery: DO NOT APPLY any lotions, deodorants or perfumes.   Do not wear jewelry or makeup Do not wear nail  polish, gel polish, artificial nails, or any other type of covering on natural nails (fingers and toes) Do not bring valuables to the hospital. [facility] is not responsible for valuables/personal belongings. Put on clean/comfortable clothes.  Please brush your teeth.  Ask your nurse before applying any prescription medications to the skin.

## 2023-04-24 NOTE — Progress Notes (Addendum)
PCP - Dr. Fleet Contras  Chest x-ray - 02/14/23 EKG - 02/14/23  Fasting Blood Sugar - runs low (has had to call EMS because of blood sugar in the 40's. Pt has stopped [place] and has not had as many low sugars.) Checks Blood Sugar several times a day -   Last dose of GLP1 agonist-  04/20/23 GLP1 instructions: Instructed pt not to take 04/27/23   ERAS Protcol - clear liquids only, no drink  Anesthesia review: yes, blood sugar issues  Patient denies shortness of breath, fever, cough and chest pain at PAT appointment   All instructions explained to the patient, with a verbal understanding of the material. Patient agrees to go over the instructions while at home for a better understanding. Patient also instructed to self quarantine after being tested for COVID-19. The opportunity to ask questions was provided.

## 2023-04-30 ENCOUNTER — Ambulatory Visit (HOSPITAL_COMMUNITY): Payer: Self-pay | Admitting: Physician Assistant

## 2023-04-30 ENCOUNTER — Ambulatory Visit (HOSPITAL_BASED_OUTPATIENT_CLINIC_OR_DEPARTMENT_OTHER): Payer: Medicare HMO

## 2023-04-30 ENCOUNTER — Ambulatory Visit (HOSPITAL_COMMUNITY)
Admission: RE | Admit: 2023-04-30 | Discharge: 2023-04-30 | Disposition: A | Discharge status: Home / Self Care | Payer: Medicare HMO | Attending: Surgery | Admitting: Surgery

## 2023-04-30 ENCOUNTER — Encounter (HOSPITAL_COMMUNITY): Payer: Self-pay | Admitting: Surgery

## 2023-04-30 ENCOUNTER — Encounter (HOSPITAL_COMMUNITY)
Admission: RE | Disposition: A | Discharge status: Home / Self Care | Payer: Self-pay | Source: Home / Self Care | Attending: Surgery

## 2023-04-30 DIAGNOSIS — F419 Anxiety disorder, unspecified: Secondary | ICD-10-CM

## 2023-04-30 DIAGNOSIS — E1040 Type 1 diabetes mellitus with diabetic neuropathy, unspecified: Secondary | ICD-10-CM | POA: Insufficient documentation

## 2023-04-30 DIAGNOSIS — Z794 Long term (current) use of insulin: Secondary | ICD-10-CM | POA: Insufficient documentation

## 2023-04-30 DIAGNOSIS — K8020 Calculus of gallbladder without cholecystitis without obstruction: Secondary | ICD-10-CM | POA: Diagnosis not present

## 2023-04-30 DIAGNOSIS — Z6841 Body Mass Index (BMI) 40.0 and over, adult: Secondary | ICD-10-CM | POA: Diagnosis not present

## 2023-04-30 DIAGNOSIS — Z01818 Encounter for other preprocedural examination: Secondary | ICD-10-CM

## 2023-04-30 HISTORY — PX: CHOLECYSTECTOMY: SHX55

## 2023-04-30 LAB — GLUCOSE, CAPILLARY
Glucose-Capillary: 135 mg/dL — ABNORMAL HIGH (ref 70–99)
Glucose-Capillary: 162 mg/dL — ABNORMAL HIGH (ref 70–99)
Glucose-Capillary: 197 mg/dL — ABNORMAL HIGH (ref 70–99)
Glucose-Capillary: 203 mg/dL — ABNORMAL HIGH (ref 70–99)

## 2023-04-30 LAB — POCT PREGNANCY, URINE: Preg Test, Ur: NEGATIVE

## 2023-04-30 SURGERY — LAPAROSCOPIC CHOLECYSTECTOMY
Anesthesia: General | Site: Abdomen

## 2023-04-30 MED ORDER — ROCURONIUM BROMIDE 10 MG/ML (PF) SYRINGE
PREFILLED_SYRINGE | INTRAVENOUS | Status: DC | PRN
  Administered 2023-04-30: 50 mg via INTRAVENOUS

## 2023-04-30 MED ORDER — ACETAMINOPHEN 500 MG PO TABS
1000.0000 mg | ORAL_TABLET | ORAL | Status: AC
  Administered 2023-04-30: 1000 mg via ORAL
  Filled 2023-04-30: qty 2

## 2023-04-30 MED ORDER — DEXAMETHASONE SODIUM PHOSPHATE 10 MG/ML IJ SOLN
INTRAMUSCULAR | Status: AC
  Filled 2023-04-30: qty 1

## 2023-04-30 MED ORDER — LACTATED RINGERS IV SOLN: INTRAVENOUS | Status: DC

## 2023-04-30 MED ORDER — CHLORHEXIDINE GLUCONATE 0.12 % MT SOLN
15.0000 mL | Freq: Once | OROMUCOSAL | Status: AC
  Administered 2023-04-30: 15 mL via OROMUCOSAL
  Filled 2023-04-30: qty 15

## 2023-04-30 MED ORDER — BUPIVACAINE-EPINEPHRINE (PF) 0.25% -1:200000 IJ SOLN
INTRAMUSCULAR | Status: AC
  Filled 2023-04-30: qty 30

## 2023-04-30 MED ORDER — FENTANYL CITRATE (PF) 250 MCG/5ML IJ SOLN
INTRAMUSCULAR | Status: AC
  Filled 2023-04-30: qty 5

## 2023-04-30 MED ORDER — ORAL CARE MOUTH RINSE: 15.0000 mL | Freq: Once | OROMUCOSAL | Status: AC

## 2023-04-30 MED ORDER — DEXTROSE 50 % IV SOLN
INTRAVENOUS | Status: DC | PRN
  Administered 2023-04-30: 12.5 g via INTRAVENOUS

## 2023-04-30 MED ORDER — PROPOFOL 10 MG/ML IV BOLUS
INTRAVENOUS | Status: DC | PRN
Start: 2023-04-30 — End: 2023-04-30
  Administered 2023-04-30: 120 mg via INTRAVENOUS

## 2023-04-30 MED ORDER — SODIUM CHLORIDE 0.9 % IR SOLN
Status: DC | PRN
  Administered 2023-04-30: 1000 mL

## 2023-04-30 MED ORDER — CEFAZOLIN SODIUM-DEXTROSE 2-4 GM/100ML-% IV SOLN
2.0000 g | INTRAVENOUS | Status: AC
  Administered 2023-04-30: 2 g via INTRAVENOUS
  Filled 2023-04-30: qty 100

## 2023-04-30 MED ORDER — LIDOCAINE 2% (20 MG/ML) 5 ML SYRINGE
INTRAMUSCULAR | Status: DC | PRN
  Administered 2023-04-30: 60 mg via INTRAVENOUS

## 2023-04-30 MED ORDER — MIDAZOLAM HCL 2 MG/2ML IJ SOLN
INTRAMUSCULAR | Status: AC
  Filled 2023-04-30: qty 2

## 2023-04-30 MED ORDER — DEXAMETHASONE SODIUM PHOSPHATE 10 MG/ML IJ SOLN
INTRAMUSCULAR | Status: DC | PRN
  Administered 2023-04-30: 5 mg via INTRAVENOUS

## 2023-04-30 MED ORDER — ONDANSETRON HCL 4 MG/2ML IJ SOLN
INTRAMUSCULAR | Status: AC
  Filled 2023-04-30: qty 4

## 2023-04-30 MED ORDER — OXYCODONE HCL 5 MG PO TABS: 5.0000 mg | ORAL_TABLET | Freq: Four times a day (QID) | ORAL | 0 refills | Status: AC | PRN

## 2023-04-30 MED ORDER — CELECOXIB 200 MG PO CAPS
200.0000 mg | ORAL_CAPSULE | ORAL | Status: AC
  Administered 2023-04-30: 200 mg via ORAL
  Filled 2023-04-30: qty 1

## 2023-04-30 MED ORDER — LIDOCAINE 2% (20 MG/ML) 5 ML SYRINGE
INTRAMUSCULAR | Status: AC
  Filled 2023-04-30: qty 5

## 2023-04-30 MED ORDER — FENTANYL CITRATE (PF) 100 MCG/2ML IJ SOLN
25.0000 ug | INTRAMUSCULAR | Status: DC | PRN
  Administered 2023-04-30: 50 ug via INTRAVENOUS
  Administered 2023-04-30: 25 ug via INTRAVENOUS

## 2023-04-30 MED ORDER — BUPIVACAINE-EPINEPHRINE 0.25% -1:200000 IJ SOLN
INTRAMUSCULAR | Status: DC | PRN
  Administered 2023-04-30: 30 mL

## 2023-04-30 MED ORDER — FENTANYL CITRATE (PF) 250 MCG/5ML IJ SOLN
INTRAMUSCULAR | Status: DC | PRN
  Administered 2023-04-30: 50 ug via INTRAVENOUS
  Administered 2023-04-30: 100 ug via INTRAVENOUS

## 2023-04-30 MED ORDER — SUGAMMADEX SODIUM 200 MG/2ML IV SOLN
INTRAVENOUS | Status: DC | PRN
  Administered 2023-04-30: 200 mg via INTRAVENOUS

## 2023-04-30 MED ORDER — FENTANYL CITRATE (PF) 100 MCG/2ML IJ SOLN
INTRAMUSCULAR | Status: AC
  Filled 2023-04-30: qty 2

## 2023-04-30 MED ORDER — MIDAZOLAM HCL 2 MG/2ML IJ SOLN
INTRAMUSCULAR | Status: DC | PRN
  Administered 2023-04-30: 2 mg via INTRAVENOUS

## 2023-04-30 MED ORDER — PROPOFOL 10 MG/ML IV BOLUS
INTRAVENOUS | Status: AC
  Filled 2023-04-30: qty 20

## 2023-04-30 MED ORDER — ROCURONIUM BROMIDE 10 MG/ML (PF) SYRINGE
PREFILLED_SYRINGE | INTRAVENOUS | Status: AC
  Filled 2023-04-30: qty 10

## 2023-04-30 MED ORDER — 0.9 % SODIUM CHLORIDE (POUR BTL) OPTIME
TOPICAL | Status: DC | PRN
  Administered 2023-04-30: 1000 mL

## 2023-04-30 MED ORDER — DEXTROSE 50 % IV SOLN
INTRAVENOUS | Status: AC
  Filled 2023-04-30: qty 50

## 2023-04-30 MED ORDER — CEFAZOLIN SODIUM-DEXTROSE 2-4 GM/100ML-% IV SOLN
INTRAVENOUS | Status: AC
  Filled 2023-04-30: qty 100

## 2023-04-30 MED ORDER — ONDANSETRON HCL 4 MG/2ML IJ SOLN
INTRAMUSCULAR | Status: DC | PRN
  Administered 2023-04-30 (×2): 4 mg via INTRAVENOUS

## 2023-04-30 SURGICAL SUPPLY — 37 items
ADH SKN CLS APL DERMABOND .7 (GAUZE/BANDAGES/DRESSINGS) ×1
APL PRP STRL LF DISP 70% ISPRP (MISCELLANEOUS) ×1
APPLIER CLIP 5 13 M/L LIGAMAX5 (MISCELLANEOUS) ×1
APR CLP MED LRG 5 ANG JAW (MISCELLANEOUS) ×1
CANISTER SUCT 3000ML PPV (MISCELLANEOUS) ×2 IMPLANT
CHLORAPREP W/TINT 26 (MISCELLANEOUS) ×2 IMPLANT
CLIP APPLIE 5 13 M/L LIGAMAX5 (MISCELLANEOUS) ×2 IMPLANT
COVER SURGICAL LIGHT HANDLE (MISCELLANEOUS) ×2 IMPLANT
DERMABOND ADVANCED .7 DNX12 (GAUZE/BANDAGES/DRESSINGS) ×2 IMPLANT
ELECT REM PT RETURN 9FT ADLT (ELECTROSURGICAL) ×1
ELECTRODE REM PT RTRN 9FT ADLT (ELECTROSURGICAL) ×2 IMPLANT
GLOVE BIOGEL PI IND STRL 6 (GLOVE) ×2 IMPLANT
GLOVE BIOGEL PI MICRO STRL 5.5 (GLOVE) ×2 IMPLANT
GOWN STRL REUS W/ TWL LRG LVL3 (GOWN DISPOSABLE) ×6 IMPLANT
GOWN STRL REUS W/TWL LRG LVL3 (GOWN DISPOSABLE) ×3
IRRIG SUCT STRYKERFLOW 2 WTIP (MISCELLANEOUS) ×1
IRRIGATION SUCT STRKRFLW 2 WTP (MISCELLANEOUS) ×2 IMPLANT
KIT BASIN OR (CUSTOM PROCEDURE TRAY) ×2 IMPLANT
KIT TURNOVER KIT B (KITS) ×2 IMPLANT
L-HOOK LAP DISP 36CM (ELECTROSURGICAL) ×1
LHOOK LAP DISP 36CM (ELECTROSURGICAL) ×2 IMPLANT
NS IRRIG 1000ML POUR BTL (IV SOLUTION) ×2 IMPLANT
PAD ARMBOARD 7.5X6 YLW CONV (MISCELLANEOUS) ×2 IMPLANT
PENCIL BUTTON HOLSTER BLD 10FT (ELECTRODE) ×2 IMPLANT
SCISSORS LAP 5X35 DISP (ENDOMECHANICALS) ×2 IMPLANT
SET TUBE SMOKE EVAC HIGH FLOW (TUBING) ×2 IMPLANT
SLEEVE Z-THREAD 5X100MM (TROCAR) ×4 IMPLANT
SUT MNCRL AB 4-0 PS2 18 (SUTURE) ×2 IMPLANT
SYS BAG RETRIEVAL 10MM (BASKET) ×1
SYSTEM BAG RETRIEVAL 10MM (BASKET) IMPLANT
TOWEL GREEN STERILE (TOWEL DISPOSABLE) ×2 IMPLANT
TOWEL GREEN STERILE FF (TOWEL DISPOSABLE) ×2 IMPLANT
TRAY LAPAROSCOPIC MC (CUSTOM PROCEDURE TRAY) ×2 IMPLANT
TROCAR BALLN 12MMX100 BLUNT (TROCAR) ×2 IMPLANT
TROCAR Z-THREAD OPTICAL 5X100M (TROCAR) ×2 IMPLANT
WARMER LAPAROSCOPE (MISCELLANEOUS) ×2 IMPLANT
WATER STERILE IRR 1000ML POUR (IV SOLUTION) ×2 IMPLANT

## 2023-04-30 NOTE — Anesthesia Preprocedure Evaluation (Addendum)
Anesthesia Evaluation  Patient identified by MRN, date of birth, ID band Patient awake    Reviewed: Allergy & Precautions, NPO status , Patient's Chart, lab work & pertinent test results  Airway Mallampati: I  TM Distance: >3 FB Neck ROM: Full    Dental  (+) Missing, Dental Advisory Given   Pulmonary neg pulmonary ROS   Pulmonary exam normal breath sounds clear to auscultation       Cardiovascular negative cardio ROS Normal cardiovascular exam Rhythm:Regular Rate:Normal     Neuro/Psych   Anxiety     negative neurological ROS  negative psych ROS   GI/Hepatic negative GI ROS, Neg liver ROS,,,  Endo/Other  diabetes, Type 1, Insulin Dependent  Morbid obesity (BMI 42)  Renal/GU negative Renal ROS  negative genitourinary   Musculoskeletal  (+) Arthritis ,    Abdominal   Peds  Hematology negative hematology ROS (+)   Anesthesia Other Findings   Reproductive/Obstetrics                             Anesthesia Physical Anesthesia Plan  ASA: 3  Anesthesia Plan: General   Post-op Pain Management: Tylenol PO (pre-op)*   Induction: Intravenous  PONV Risk Score and Plan: 3 and Midazolam and Ondansetron  Airway Management Planned: Oral ETT  Additional Equipment:   Intra-op Plan:   Post-operative Plan: Extubation in OR  Informed Consent: I have reviewed the patients History and Physical, chart, labs and discussed the procedure including the risks, benefits and alternatives for the proposed anesthesia with the patient or authorized representative who has indicated his/her understanding and acceptance.     Dental advisory given  Plan Discussed with: CRNA  Anesthesia Plan Comments:        Anesthesia Quick Evaluation

## 2023-04-30 NOTE — H&P (Signed)
[name] is an 47 y.o. female.   Chief Complaint: gallstones HPI: [name] is a 47 yo female who has been having epigastric abdominal pain. She was seen in the ED and imaging showed gallstones. She presents today for surgery.   Past Medical History:  Diagnosis Date   Abscess 11/14/2012   RIGHT FINGER   Anemia    after surgeries   Anxiety    Arthritis    Cataracts, bilateral    Chronic back pain    Complication of anesthesia    has had to have blood transfusions after several surgeries   Diabetes mellitus    type 1   Diabetic neuropathy (HCC)    Herpes virus disease    Hip fracture (HCC)    left side   History of blood transfusion 09/17/2011   left  hip- pinning   Neuromuscular disorder (HCC)    neuropathy   Shortness of breath dyspnea    with exertion   Urinary tract infection    hx of    Past Surgical History:  Procedure Laterality Date   BILATERAL SALPINGECTOMY     CATARACT EXTRACTION     left   CATARACT EXTRACTION W/PHACO  06/12/2012   Procedure: CATARACT EXTRACTION PHACO AND INTRAOCULAR LENS PLACEMENT (IOC);  Surgeon: [name], MD;  Location: [facility] OR;  Service: Ophthalmology;  Laterality: Right;   CATARACT EXTRACTION W/PHACO  06/15/2012   Procedure: CATARACT EXTRACTION PHACO AND INTRAOCULAR LENS PLACEMENT (IOC);  Surgeon: [name], MD;  Location: [facility] OR;  Service: Ophthalmology;  Laterality: Left;   CESAREAN SECTION     x 3   CESAREAN SECTION WITH BILATERAL TUBAL LIGATION Bilateral 09/10/2013   Procedure: REPEAT CESAREAN SECTION WITH BILATERAL TUBAL LIGATION;  Surgeon: Purcell Nails, MD;  Location: [facility] ORS;  Service: Obstetrics;  Laterality: Bilateral;   EYE SURGERY Bilateral    FRACTURE SURGERY     surgery left hip   HAND SURGERY Right 11/2012   I & D EXTREMITY Right 11/23/2012   Procedure: IRRIGATION AND DEBRIDEMENT EXTREMITY;  Surgeon: Jodi Marble, MD;  Location: [facility] OR;  Service: Orthopedics;  Laterality: Right;   I & D EXTREMITY Right  11/30/2012   Procedure: IRRIGATION AND DEBRIDEMENT Right hand Abscess;  Surgeon: Jodi Marble, MD;  Location: [facility] OR;  Service: Orthopedics;  Laterality: Right;   TOTAL HIP ARTHROPLASTY Left 10/12/2014   Procedure: TOTAL HIP ARTHROPLASTY;  Surgeon: Nadara Mustard, MD;  Location: [facility] OR;  Service: Orthopedics;  Laterality: Left;    Family History  Problem Relation Age of Onset   Diabetes Mother    Heart disease Mother    Heart attack Father    Asthma Son    Healthy Son    Asthma Son    Healthy Son    Healthy Daughter    Social History:  reports that she has never smoked. She has been exposed to tobacco smoke. She has never used smokeless tobacco. She reports that she does not drink alcohol and does not use drugs.  Allergies:  Allergies  Allergen Reactions   Phenergan [Promethazine Hcl]     Lost vision   Tramadol Other (See Comments)    "put her heart into shock"    Medications Prior to Admission  Medication Sig Dispense Refill   Cholecalciferol (VITAMIN D3 PO) Take 1 tablet by mouth daily.     cyclobenzaprine (FLEXERIL) 10 MG tablet Take 10 mg by mouth 3 (three) times daily as needed for muscle spasms.  gabapentin (NEURONTIN) 400 MG capsule Take 200 mg by mouth 2 (two) times daily.     insulin aspart (NOVOLOG) 100 UNIT/ML injection Inject 0-3 Units into the skin 3 (three) times daily as needed for high blood sugar.     insulin degludec (TRESIBA FLEXTOUCH) 100 UNIT/ML FlexTouch Pen Inject 50 Units into the skin daily.     meloxicam (MOBIC) 15 MG tablet Take 15 mg by mouth daily as needed for pain.     metroNIDAZOLE (METROGEL VAGINAL) 0.75 % vaginal gel Place 1 Applicatorful vaginally 2 (two) times a week. For 4-6 months. (Patient taking differently: Place 1 Applicatorful vaginally daily as needed (when instructed by MD).) 70 g 4   ondansetron (ZOFRAN-ODT) 4 MG disintegrating tablet Take 1 tablet (4 mg total) by mouth every 8 (eight) hours as needed for nausea or vomiting. 10  tablet 0   oxyCODONE-acetaminophen (PERCOCET) 5-325 MG tablet Take 1-2 tablets by mouth every 6 (six) hours as needed. 12 tablet 0   simvastatin (ZOCOR) 10 MG tablet Take 10 mg by mouth daily.     tirzepatide [facility]) 10 MG/0.5ML Pen Inject 10 mg into the skin every Sunday.     zolpidem (AMBIEN) 10 MG tablet Take 10 mg by mouth at bedtime.     aspirin EC 325 MG tablet Take 1 tablet (325 mg total) by mouth daily. (Patient not taking: Reported on 04/22/2023) 30 tablet 0   benzonatate (TESSALON) 100 MG capsule Take 1-2 capsules (100-200 mg total) by mouth 3 (three) times daily as needed. (Patient not taking: Reported on 04/22/2023) 60 capsule 0   cetirizine (ZYRTEC ALLERGY) 10 MG tablet Take 1 tablet (10 mg total) by mouth daily. (Patient not taking: Reported on 04/22/2023) 30 tablet 0   dextromethorphan-guaiFENesin (MUCINEX DM) 30-600 MG 12hr tablet Take 1 tablet by mouth 2 (two) times daily. (Patient not taking: Reported on 04/22/2023) 20 tablet 0   ferrous sulfate 325 (65 FE) MG tablet Take 325 mg by mouth daily with breakfast. (Patient not taking: Reported on 04/22/2023)     fluconazole (DIFLUCAN) 200 MG tablet Take 1 tablet (200 mg total) by mouth daily. (Patient not taking: Reported on 04/22/2023) 3 tablet PRN   ondansetron (ZOFRAN) 4 MG tablet Take 1 tablet (4 mg total) by mouth every 8 (eight) hours as needed for nausea or vomiting. (Patient not taking: Reported on 04/22/2023) 20 tablet 0   oseltamivir (TAMIFLU) 75 MG capsule Take 1 capsule (75 mg total) by mouth every 12 (twelve) hours. (Patient not taking: Reported on 04/22/2023) 10 capsule 0   pseudoephedrine (SUDAFED) 60 MG tablet Take 1 tablet (60 mg total) by mouth every 8 (eight) hours as needed for congestion. (Patient not taking: Reported on 04/22/2023) 30 tablet 0   terconazole (TERAZOL 7) 0.4 % vaginal cream Place 1 applicator vaginally at bedtime. (Patient not taking: Reported on 04/22/2023) 45 g 3   tirzepatide (MOUNJARO) 12.5 MG/0.5ML Pen Inject 12.5  mg into the skin once a week. (Patient not taking: Reported on 04/22/2023) 4 mL 2   valACYclovir (VALTREX) 500 MG tablet Take 1 tablet (500 mg total) by mouth 2 (two) times daily. For 2 weeks. (Patient taking differently: Take 500 mg by mouth as needed. For 2 weeks.) 28 tablet 4    Results for orders placed or performed during the hospital encounter of 04/30/23 (from the past 48 hour(s))  Pregnancy, urine POC     Status: None   Collection Time: 04/30/23 11:39 AM  Result Value Ref Range  Preg Test, Ur NEGATIVE NEGATIVE    Comment:        THE SENSITIVITY OF THIS METHODOLOGY IS >24 mIU/mL   Glucose, capillary     Status: Abnormal   Collection Time: 04/30/23 11:40 AM  Result Value Ref Range   Glucose-Capillary 135 (H) 70 - 99 mg/dL    Comment: Glucose reference range applies only to samples taken after fasting for at least 8 hours.   Comment 1 Notify RN    Comment 2 Document in Chart    No results found.  Review of Systems  Blood pressure 116/70, pulse 78, temperature 98 F (36.7 C), temperature source Oral, resp. rate 20, height 5\' 2"  (1.575 m), weight 103 kg, SpO2 99%. Physical Exam Vitals reviewed.  Constitutional:      General: She is not in acute distress.    Appearance: Normal appearance.  HENT:     Head: Normocephalic and atraumatic.  Eyes:     General: No scleral icterus.    Conjunctiva/sclera: Conjunctivae normal.  Pulmonary:     Effort: Pulmonary effort is normal. No respiratory distress.  Abdominal:     General: There is no distension.     Palpations: Abdomen is soft.  Skin:    General: Skin is warm and dry.  Neurological:     General: No focal deficit present.     Mental Status: She is alert and oriented to person, place, and time.      Assessment/Plan 47 yo female with symptomatic cholelithiasis. Proceed to OR for laparoscopic cholecystectomy. Procedure details were reviewed, all questions answered. Plan for discharge home from PACU.  Fritzi Mandes,  MD 04/30/2023, 12:19 PM

## 2023-04-30 NOTE — Discharge Instructions (Addendum)
[facility] DISCHARGE INSTRUCTIONS  Activity No heavy lifting greater than 15 pounds for 4 weeks after surgery. Ok to shower in 24 hours, but do not bathe or submerge incisions underwater. Do not drive while taking narcotic pain medication. You may drive when you are no longer taking prescription pain medication, you can comfortably wear a seatbelt, and you can safely maneuver your car and apply brakes.  Wound Care Your incisions are covered with skin glue called Dermabond. This will peel off on its own over time. You may shower and allow warm soapy water to run over your incisions. Gently pat dry. Do not submerge your incision underwater until cleared by your surgeon. Monitor your incision for any new redness, tenderness, or drainage. Many patients will experience some swelling and bruising at the incisions.  Ice packs will help.  Swelling and bruising can take several days to resolve.   Medications A  prescription for pain medication may be given to you upon discharge.  Take your pain medication as prescribed, if needed.  If narcotic pain medicine is not needed, then you may take acetaminophen (Tylenol) or ibuprofen (Advil) as needed. It is common to experience some constipation if taking pain medication after surgery.  Increasing fluid intake and taking a stool softener (such as Colace) will usually help or prevent this problem from occurring.  A mild laxative (Milk of Magnesia or Miralax) should be taken according to package directions if there are no bowel movements after 48 hours. Take your usually prescribed medications unless otherwise directed. If you need a refill on your pain medication, please contact your pharmacy.  They will contact our office to request authorization. Prescriptions will not be filled after 5 pm or on weekends.  When to Call us: Fever greater than 100.5 New redness, drainage, or swelling at incision site Severe pain, nausea, or  vomiting Persistent bleeding from incisions Jaundice (yellowing of the whites of the eyes or skin)  Follow-up You have an appointment scheduled with Dr. Freida Busman on May 20, 2023 at 9:30am. This will be at the [facility] office at [address], [address], [place], [place]. Please arrive at least 15 minutes prior to your scheduled appointment time.  IF YOU HAVE DISABILITY OR FAMILY LEAVE FORMS, YOU MUST BRING THEM TO THE OFFICE FOR PROCESSING.   DO NOT GIVE THEM TO YOUR DOCTOR.  The clinic staff is available to answer your questions during regular business hours.  Please don't hesitate to call and ask to speak to one of the nurses for clinical concerns.  If you have a medical emergency, go to the nearest emergency room or call 911.  A surgeon from [facility] is always on call at the hospital  [address], [address], [place], [place]  [address] ?  [address] [address], [place], [place]   [address] [phone] ? Toll Free: [phone] ? FAX [phone] Web site: [email or web address]      Managing Your Pain After Surgery Without Opioids    Thank you for participating in our program to help patients manage their pain after surgery without opioids. This is part of our effort to provide you with the best care possible, without exposing you or your family to the risk that opioids pose.  What pain can I expect after surgery? You can expect to have some pain after surgery. This is normal. The pain is typically worse the day after surgery, and quickly begins to get better. Many studies have  found that many patients are able to manage their pain after surgery with Over-the-Counter (OTC) medications such as Tylenol and Motrin. If you have a condition that does not allow you to take Tylenol or Motrin, notify your surgical team.  How will I manage my pain? The best strategy for controlling your pain after surgery is around the clock pain control with  Tylenol (acetaminophen) and Motrin (ibuprofen or Advil). Alternating these medications with each other allows you to maximize your pain control. In addition to Tylenol and Motrin, you can use heating pads or ice packs on your incisions to help reduce your pain.  How will I alternate your regular strength over-the-counter pain medication? You will take a dose of pain medication every three hours. Start by taking 650 mg of Tylenol (2 pills of 325 mg) 3 hours later take 600 mg of Motrin (3 pills of 200 mg) 3 hours after taking the Motrin take 650 mg of Tylenol 3 hours after that take 600 mg of Motrin.   - 1 -  See example - if your first dose of Tylenol is at 12:00 PM   12:00 PM Tylenol 650 mg (2 pills of 325 mg)  3:00 PM Motrin 600 mg (3 pills of 200 mg)  6:00 PM Tylenol 650 mg (2 pills of 325 mg)  9:00 PM Motrin 600 mg (3 pills of 200 mg)  Continue alternating every 3 hours   We recommend that you follow this schedule around-the-clock for at least 3 days after surgery, or until you feel that it is no longer needed. Use the table on the last page of this handout to keep track of the medications you are taking. Important: Do not take more than 3000mg  of Tylenol or 3200mg  of Motrin in a 24-hour period. Do not take ibuprofen/Motrin if you have a history of bleeding stomach ulcers, severe kidney disease, &/or actively taking a blood thinner  What if I still have pain? If you have pain that is not controlled with the over-the-counter pain medications (Tylenol and Motrin or Advil) you might have what we call "breakthrough" pain. You will receive a prescription for a small amount of an opioid pain medication such as Oxycodone, Tramadol, or Tylenol with Codeine. Use these opioid pills in the first 24 hours after surgery if you have breakthrough pain. Do not take more than 1 pill every 4-6 hours.  If you still have uncontrolled pain after using all opioid pills, don't hesitate to call our staff  using the number provided. We will help make sure you are managing your pain in the best way possible, and if necessary, we can provide a prescription for additional pain medication.   Day 1    Time  Name of Medication Number of pills taken  Amount of Acetaminophen  Pain Level   Comments  AM PM       AM PM       AM PM       AM PM       AM PM       AM PM       AM PM       AM PM       Total Daily amount of Acetaminophen Do not take more than  3,000 mg per day      Day 2    Time  Name of Medication Number of pills taken  Amount of Acetaminophen  Pain Level   Comments  AM PM  AM PM       AM PM       AM PM       AM PM       AM PM       AM PM       AM PM       Total Daily amount of Acetaminophen Do not take more than  3,000 mg per day      Day 3    Time  Name of Medication Number of pills taken  Amount of Acetaminophen  Pain Level   Comments  AM PM       AM PM       AM PM       AM PM         AM PM       AM PM       AM PM       AM PM       Total Daily amount of Acetaminophen Do not take more than  3,000 mg per day      Day 4    Time  Name of Medication Number of pills taken  Amount of Acetaminophen  Pain Level   Comments  AM PM       AM PM       AM PM       AM PM       AM PM       AM PM       AM PM       AM PM       Total Daily amount of Acetaminophen Do not take more than  3,000 mg per day      Day 5    Time  Name of Medication Number of pills taken  Amount of Acetaminophen  Pain Level   Comments  AM PM       AM PM       AM PM       AM PM       AM PM       AM PM       AM PM       AM PM       Total Daily amount of Acetaminophen Do not take more than  3,000 mg per day      Day 6    Time  Name of Medication Number of pills taken  Amount of Acetaminophen  Pain Level  Comments  AM PM       AM PM       AM PM       AM PM       AM PM       AM PM       AM PM       AM PM       Total Daily amount of  Acetaminophen Do not take more than  3,000 mg per day      Day 7    Time  Name of Medication Number of pills taken  Amount of Acetaminophen  Pain Level   Comments  AM PM       AM PM       AM PM       AM PM       AM PM       AM PM       AM PM       AM PM  Total Daily amount of Acetaminophen Do not take more than  3,000 mg per day        For additional information about how and where to safely dispose of unused opioid medications - [email or web address]  Disclaimer: This document contains information and/or instructional materials adapted from [facility] for the typical patient with your condition. It does not replace medical advice from your health care provider because your experience may differ from that of the typical patient. Talk to your health care provider if you have any questions about this document, your condition or your treatment plan. Adapted from [facility]

## 2023-04-30 NOTE — Anesthesia Procedure Notes (Addendum)
Procedure Name: Intubation Date/Time: 04/30/2023 1:13 PM  Performed by: Camillia Herter, CRNAPre-anesthesia Checklist: Patient identified, Emergency Drugs available, Suction available and Patient being monitored Patient Re-evaluated:Patient Re-evaluated prior to induction Oxygen Delivery Method: Circle System Utilized Preoxygenation: Pre-oxygenation with 100% oxygen Induction Type: IV induction Ventilation: Mask ventilation without difficulty Laryngoscope Size: 3 and Mac Grade View: Grade I Tube type: Oral Tube size: 7.0 mm Number of attempts: 1 Airway Equipment and Method: Stylet and Oral airway Placement Confirmation: ETT inserted through vocal cords under direct vision, positive ETCO2 and breath sounds checked- equal and bilateral Secured at: 22 cm Tube secured with: Tape Dental Injury: Teeth and Oropharynx as per pre-operative assessment

## 2023-04-30 NOTE — Transfer of Care (Addendum)
Immediate Anesthesia Transfer of Care Note  Patient: [name]  Procedure(s) Performed: LAPAROSCOPIC CHOLECYSTECTOMY (Abdomen)  Patient Location: PACU  Anesthesia Type:General  Level of Consciousness: drowsy  Airway & Oxygen Therapy: Patient Spontanous Breathing and Patient connected to face mask oxygen  Post-op Assessment: Report given to RN and Post -op Vital signs reviewed and stable  Post vital signs: Reviewed and stable  Last Vitals:  Vitals Value Taken Time  BP 160/66 04/30/23 1408  Temp    Pulse 88 04/30/23 1413  Resp 13 04/30/23 1413  SpO2 100 % 04/30/23 1413  Vitals shown include unfiled device data.  Last Pain:  Vitals:   04/30/23 1141  TempSrc: Oral  PainSc:       Patients Stated Pain Goal: 0 (04/30/23 1136)  Complications: No notable events documented.

## 2023-04-30 NOTE — Op Note (Signed)
Date: 04/30/23  Patient: [name] MRN: [id number]  Preoperative Diagnosis: Symptomatic cholelithiasis Postoperative Diagnosis: Same  Procedure: Laparoscopic cholecystectomy  Surgeon: Sophronia Simas, MD  EBL: Minimal  Anesthesia: General endotracheal  Specimens: Gallbladder  Indications: Ms. Crump is a 47 yo female who presented with intermittent upper abdominal pain and was seen in the ED. Ultrasound confirmed cholelithiasis. She was evaluated in the office and after a discussion of the risks and benefits of surgery, elected to proceed with cholecystectomy.  Findings: Cholelithiasis without evidence of acute cholecystitis.  Procedure details: Informed consent was obtained in the preoperative area prior to the procedure. The patient was brought to the operating room and placed on the table in the supine position. General anesthesia was induced and appropriate lines and drains were placed for intraoperative monitoring. Perioperative antibiotics were administered per SCIP guidelines. The abdomen was prepped and draped in the usual sterile fashion. A pre-procedure timeout was taken verifying patient identity, surgical site and procedure to be performed.  A small infraumbilical skin incision was made, the subcutaneous tissue was divided with cautery, and the umbilical stalk was grasped and elevated. The fascia was incised and the peritoneal cavity was directly visualized. A 12mm Hassan trocar was placed and the abdomen was insufflated. The peritoneal cavity was inspected with no evidence of visceral or vascular injury. Three 5mm ports were placed in the right subcostal margin, all under direct visualization. The fundus of the gallbladder was grasped and retracted cephalad. There were thin filmy omental adhesions to the gallbladder, which were taken down with cautery. The infundibulum was retracted laterally. The peritoneum overlying the gallbladder was incised and the cystic triangle was  dissected out using cautery and blunt dissection. The critical view of safety was obtained. The cystic duct and cystic artery were clipped and ligated, leaving two clips behind on the cystic duct stump. The gallbladder was taken off the liver using cautery. A posterior branch of the cystic artery was clipped. The gallbladder was further taken off the liver, and placed in an endocatch bag. The surgical site was irrigated with saline until the effluent was clear. Hemostasis was achieved in the gallbladder fossa using cautery. The cystic duct and artery stumps were visually inspected and there was no evidence of bile leak or bleeding. The ports were removed under direct visualization and the abdomen was desufflated. The specimen was extracted via the umbilical port site. The umbilical port site fascia was closed with a 0 vicryl pursestring suture. The skin at all port sites was closed with 4-0 monocryl subcuticular suture. Dermabond was applied.  The patient tolerated the procedure well with no apparent complications. All counts were correct x2 at the end of the procedure. The patient was extubated and taken to PACU in stable condition.  Sophronia Simas, MD 04/30/23 3:03 PM

## 2023-05-01 ENCOUNTER — Other Ambulatory Visit: Payer: Self-pay

## 2023-05-01 ENCOUNTER — Encounter (HOSPITAL_COMMUNITY): Payer: Self-pay | Admitting: Surgery

## 2023-05-01 LAB — SURGICAL PATHOLOGY

## 2023-05-01 NOTE — Anesthesia Postprocedure Evaluation (Signed)
Anesthesia Post Note  Patient: [name]  Procedure(s) Performed: LAPAROSCOPIC CHOLECYSTECTOMY (Abdomen)     Patient location during evaluation: PACU Anesthesia Type: General Level of consciousness: awake and alert Pain management: pain level controlled Vital Signs Assessment: post-procedure vital signs reviewed and stable Respiratory status: spontaneous breathing, nonlabored ventilation, respiratory function stable and patient connected to nasal cannula oxygen Cardiovascular status: blood pressure returned to baseline and stable Postop Assessment: no apparent nausea or vomiting Anesthetic complications: no  No notable events documented.  Last Vitals:  Vitals:   04/30/23 1438 04/30/23 1445  BP:  (!) 153/81  Pulse: 90 88  Resp: (!) 24 12  Temp:  (!) 36.2 C  SpO2: 99% 99%    Last Pain:  Vitals:   04/30/23 1445  TempSrc:   PainSc: Asleep                 Ernesto Lashway L Danyetta Gillham

## 2023-11-05 ENCOUNTER — Ambulatory Visit
Admission: EM | Admit: 2023-11-05 | Discharge: 2023-11-05 | Disposition: A | Discharge status: Home / Self Care | Payer: Medicare HMO

## 2023-11-05 ENCOUNTER — Ambulatory Visit: Payer: Medicare HMO

## 2023-11-05 DIAGNOSIS — J069 Acute upper respiratory infection, unspecified: Secondary | ICD-10-CM | POA: Diagnosis not present

## 2023-11-05 LAB — POCT RAPID STREP A (OFFICE): Rapid Strep A Screen: NEGATIVE

## 2023-11-05 NOTE — ED Triage Notes (Signed)
"  This started with sore throat about 4 days ago and then mucous (drainage) and cough (intermittent), No rash. No fever.

## 2023-11-05 NOTE — ED Provider Notes (Signed)
[name]    CSN: [id number] Arrival date & time: 11/05/23  1451      History   Chief Complaint Chief Complaint  Patient presents with   Cough    Family of 2   Sore Throat    HPI [name] is a 48 y.o. female.   Patient here today for evaluation of sore throat that started about 4 days ago.  She reports she is also had some congestion and cough.  Son is here with similar symptoms.  She has not had any rash or fever.  The history is provided by the patient.  Cough Associated symptoms: sore throat   Associated symptoms: no chills, no ear pain, no eye discharge, no fever, no shortness of breath and no wheezing   Sore Throat Pertinent negatives include no abdominal pain and no shortness of breath.    Past Medical History:  Diagnosis Date   Abscess 11/14/2012   RIGHT FINGER   Anemia    after surgeries   Anxiety    Arthritis    Cataracts, bilateral    Chronic back pain    Complication of anesthesia    has had to have blood transfusions after several surgeries   Diabetes mellitus    type 1   Diabetic neuropathy (HCC)    Herpes virus disease    Hip fracture (HCC)    left side   History of blood transfusion 09/17/2011   left  hip- pinning   Neuromuscular disorder (HCC)    neuropathy   Shortness of breath dyspnea    with exertion   Urinary tract infection    hx of    Patient Active Problem List   Diagnosis Date Noted   Neuralgia 01/30/2022   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   S/P repeat low transverse C-section 09/10/2013   Cellulitis and abscess 12/22/2012   Diabetes (HCC) 11/25/2012    Past Surgical History:  Procedure Laterality Date   BILATERAL SALPINGECTOMY     CATARACT EXTRACTION     left   CATARACT EXTRACTION W/PHACO  06/12/2012   Procedure: CATARACT EXTRACTION PHACO AND INTRAOCULAR LENS PLACEMENT (IOC);  Surgeon: [name], MD;  Location: [facility] OR;  Service: Ophthalmology;  Laterality:  Right;   CATARACT EXTRACTION W/PHACO  06/15/2012   Procedure: CATARACT EXTRACTION PHACO AND INTRAOCULAR LENS PLACEMENT (IOC);  Surgeon: [name], MD;  Location: [facility] OR;  Service: Ophthalmology;  Laterality: Left;   CESAREAN SECTION     x 3   CESAREAN SECTION WITH BILATERAL TUBAL LIGATION Bilateral 09/10/2013   Procedure: REPEAT CESAREAN SECTION WITH BILATERAL TUBAL LIGATION;  Surgeon: Purcell Nails, MD;  Location: [facility] ORS;  Service: Obstetrics;  Laterality: Bilateral;   CHOLECYSTECTOMY N/A 04/30/2023   Procedure: LAPAROSCOPIC CHOLECYSTECTOMY;  Surgeon: Fritzi Mandes, MD;  Location: [facility] OR;  Service: General;  Laterality: N/A;   EYE SURGERY Bilateral    FRACTURE SURGERY     surgery left hip   HAND SURGERY Right 11/2012   I & D EXTREMITY Right 11/23/2012   Procedure: IRRIGATION AND DEBRIDEMENT EXTREMITY;  Surgeon: Jodi Marble, MD;  Location: [facility] OR;  Service: Orthopedics;  Laterality: Right;   I & D EXTREMITY Right 11/30/2012   Procedure: IRRIGATION AND DEBRIDEMENT Right hand Abscess;  Surgeon: Jodi Marble, MD;  Location: [facility] OR;  Service: Orthopedics;  Laterality: Right;   TOTAL HIP ARTHROPLASTY Left 10/12/2014   Procedure: TOTAL HIP ARTHROPLASTY;  Surgeon: Nadara Mustard, MD;  Location: [facility] OR;  Service: Orthopedics;  Laterality: Left;    OB History     Gravida  4   Para  3   Term  1   Preterm      AB  1   Living  3      SAB      IAB      Ectopic      Multiple      Live Births  1            Home Medications    Prior to Admission medications   Medication Sig Start Date End Date Taking? Authorizing Provider  cyclobenzaprine (FLEXERIL) 10 MG tablet Take 10 mg by mouth 3 (three) times daily as needed for muscle spasms.   Yes [provider]  gabapentin (NEURONTIN) 400 MG capsule Take 200 mg by mouth 2 (two) times daily.   Yes [provider]  omeprazole (PRILOSEC) 20 MG capsule Take 20 mg by mouth daily.   Yes [provider]   simvastatin (ZOCOR) 10 MG tablet Take 10 mg by mouth daily.   Yes [provider]  tirzepatide Greggory Keen) 10 MG/0.5ML Pen Inject 10 mg into the skin every Sunday.   Yes [provider]  zolpidem (AMBIEN) 10 MG tablet Take 10 mg by mouth at bedtime.   Yes [provider]  aspirin EC 325 MG tablet Take 1 tablet (325 mg total) by mouth daily. Patient not taking: Reported on 04/22/2023 10/12/14   Nadara Mustard, MD  benzonatate (TESSALON) 100 MG capsule Take 1-2 capsules (100-200 mg total) by mouth 3 (three) times daily as needed. Patient not taking: Reported on 04/22/2023 04/08/21   Wallis Bamberg, PA-C  cetirizine (ZYRTEC ALLERGY) 10 MG tablet Take 1 tablet (10 mg total) by mouth daily. Patient not taking: Reported on 04/22/2023 04/08/21   Wallis Bamberg, PA-C  Cholecalciferol (VITAMIN D3 PO) Take 1 tablet by mouth daily.    [provider]  Continuous Glucose Sensor (FREESTYLE LIBRE 3 SENSOR) MISC USE AS DIRECTED THREE TIMES DAILY AND AS NEEDED GLUCOSE TESTING ON INSULIN 10/12/23   [provider]  dextromethorphan-guaiFENesin (MUCINEX DM) 30-600 MG 12hr tablet Take 1 tablet by mouth 2 (two) times daily. Patient not taking: Reported on 04/22/2023 12/06/22   Ellsworth Lennox, PA-C  ferrous sulfate 325 (65 FE) MG tablet Take 325 mg by mouth daily with breakfast. Patient not taking: Reported on 04/22/2023    [provider]  fluconazole (DIFLUCAN) 200 MG tablet Take 1 tablet (200 mg total) by mouth daily. Patient not taking: Reported on 04/22/2023 06/24/16   Orvilla Cornwall A, CNM  insulin aspart (NOVOLOG) 100 UNIT/ML injection Inject 0-3 Units into the skin 3 (three) times daily as needed for high blood sugar.    [provider]  insulin degludec (TRESIBA FLEXTOUCH) 100 UNIT/ML FlexTouch Pen Inject 50 Units into the skin daily.    [provider]  meloxicam (MOBIC) 15 MG tablet Take 15 mg by mouth daily as needed for pain.    [provider]   metroNIDAZOLE (METROGEL VAGINAL) 0.75 % vaginal gel Place 1 Applicatorful vaginally 2 (two) times a week. For 4-6 months. Patient taking differently: Place 1 Applicatorful vaginally daily as needed (when instructed by MD). 06/24/16   Denney, Rachelle A, CNM  ondansetron (ZOFRAN) 4 MG tablet Take 1 tablet (4 mg total) by mouth every 8 (eight) hours as needed for nausea or vomiting. Patient not taking: Reported on 04/22/2023 12/06/22  Ellsworth Lennox, PA-C  ondansetron (ZOFRAN-ODT) 4 MG disintegrating tablet Take 1 tablet (4 mg total) by mouth every 8 (eight) hours as needed for nausea or vomiting. 02/14/23   Roxy Horseman, PA-C  pseudoephedrine (SUDAFED) 60 MG tablet Take 1 tablet (60 mg total) by mouth every 8 (eight) hours as needed for congestion. Patient not taking: Reported on 04/22/2023 04/08/21   Wallis Bamberg, PA-C  terconazole (TERAZOL 7) 0.4 % vaginal cream Place 1 applicator vaginally at bedtime. Patient not taking: Reported on 04/22/2023 06/24/16   Roe Coombs, CNM  tirzepatide [facility]) 12.5 MG/0.5ML Pen Inject 12.5 mg into the skin once a week. Patient not taking: Reported on 04/22/2023 02/18/23     valACYclovir (VALTREX) 500 MG tablet Take 1 tablet (500 mg total) by mouth 2 (two) times daily. For 2 weeks. Patient taking differently: Take 500 mg by mouth as needed. For 2 weeks. 07/18/17   Roe Coombs, CNM    Family History Family History  Problem Relation Age of Onset   Diabetes Mother    Heart disease Mother    Heart attack Father    Asthma Son    Healthy Son    Asthma Son    Healthy Son    Healthy Daughter     Social History Social History   Tobacco Use   Smoking status: Never    Passive exposure: Past   Smokeless tobacco: Never  Vaping Use   Vaping status: Never Used  Substance Use Topics   Alcohol use: No    Alcohol/week: 0.0 standard drinks of alcohol   Drug use: No     Allergies   Phenergan [promethazine hcl] and Tramadol   Review of Systems Review  of Systems  Constitutional:  Negative for chills and fever.  HENT:  Positive for congestion and sore throat. Negative for ear pain.   Eyes:  Negative for discharge and redness.  Respiratory:  Positive for cough. Negative for shortness of breath and wheezing.   Gastrointestinal:  Negative for abdominal pain, diarrhea, nausea and vomiting.     Physical Exam Triage Vital Signs ED Triage Vitals  Encounter Vitals Group     BP 11/05/23 1530 119/72     Systolic BP Percentile --      Diastolic BP Percentile --      Pulse Rate 11/05/23 1530 74     Resp 11/05/23 1530 20     Temp 11/05/23 1530 97.8 F (36.6 C)     Temp Source 11/05/23 1530 Oral     SpO2 11/05/23 1530 98 %     Weight 11/05/23 1526 206 lb (93.4 kg)     Height 11/05/23 1526 5\' 2"  (1.575 m)     Head Circumference --      Peak Flow --      Pain Score 11/05/23 1525 0     Pain Loc --      Pain Education --      Exclude from Growth Chart --    No data found.  Updated Vital Signs BP 119/72 (BP Location: Right Arm)   Pulse 74   Temp 97.8 F (36.6 C) (Oral)   Resp 20   Ht 5\' 2"  (1.575 m)   Wt 206 lb (93.4 kg)   LMP  (Exact Date)   SpO2 98%   BMI 37.68 kg/m   Visual Acuity Right Eye Distance:   Left Eye Distance:   Bilateral Distance:    Right Eye Near:   Left Eye Near:  Bilateral Near:     Physical Exam Vitals and nursing note reviewed.  Constitutional:      General: She is not in acute distress.    Appearance: Normal appearance. She is not ill-appearing.  HENT:     Head: Normocephalic and atraumatic.     Nose: Congestion present.     Mouth/Throat:     Mouth: Mucous membranes are moist.     Pharynx: No oropharyngeal exudate or posterior oropharyngeal erythema.  Eyes:     Conjunctiva/sclera: Conjunctivae normal.  Cardiovascular:     Rate and Rhythm: Normal rate and regular rhythm.     Heart sounds: Normal heart sounds. No murmur heard. Pulmonary:     Effort: Pulmonary effort is normal. No  respiratory distress.     Breath sounds: Normal breath sounds. No wheezing, rhonchi or rales.  Skin:    General: Skin is warm and dry.  Neurological:     Mental Status: She is alert.  Psychiatric:        Mood and Affect: Mood normal.        Thought Content: Thought content normal.      UC Treatments / Results  Labs (all labs ordered are listed, but only abnormal results are displayed) Labs Reviewed  POCT RAPID STREP A (OFFICE) - Normal    EKG   Radiology No results found.  Procedures Procedures (including critical care time)  Medications Ordered in UC Medications - No data to display  Initial Impression / Assessment and Plan / UC Course  I have reviewed the triage vital signs and the nursing notes.  Pertinent labs & imaging results that were available during my care of the patient were reviewed by me and considered in my medical decision making (see chart for details).    Strep screening negative.  Suspect likely viral etiology of symptoms and recommended symptomatic treatment, increase fluids and rest.  Recommended follow-up if no gradual improvement or with any further concerns.  Final Clinical Impressions(s) / UC Diagnoses   Final diagnoses:  Acute upper respiratory infection   Discharge Instructions   None    ED Prescriptions   None    PDMP not reviewed this encounter.   Tomi Bamberger, PA-C 11/05/23 [phone]

## 2024-06-17 ENCOUNTER — Encounter: Payer: Self-pay | Admitting: Orthopedic Surgery

## 2024-06-17 ENCOUNTER — Ambulatory Visit (INDEPENDENT_AMBULATORY_CARE_PROVIDER_SITE_OTHER): Admitting: Orthopedic Surgery

## 2024-06-17 ENCOUNTER — Other Ambulatory Visit: Payer: Self-pay

## 2024-06-17 DIAGNOSIS — Z96642 Presence of left artificial hip joint: Secondary | ICD-10-CM

## 2024-06-17 DIAGNOSIS — M2580 Other specified joint disorders, unspecified joint: Secondary | ICD-10-CM

## 2024-06-17 NOTE — Progress Notes (Signed)
 Office Visit Note   Patient: [name]           Date of Birth: November 13, 1975           MRN: [id number] Visit Date: 06/17/2024              Requested by: Shelda Atlas, MD [address] [place],  [place] [address] PCP: Shelda Atlas, MD  Chief Complaint  Patient presents with   Left Hip - Pain      HPI: Discussed the use of AI scribe software for clinical note transcription with the patient, who gave verbal consent to proceed.  History of Present Illness [name] is a 48 year old female with a history of total hip arthroplasty who presents with left hip pain and limping.  She has been experiencing left hip pain and a return of limping for the past three weeks, primarily associated with walking. The pain is located in the hip area, but she is unable to pinpoint a specific spot.  She underwent a total hip arthroplasty and internal fixation for a posterior wall acetabular fracture approximately ten years ago. She has not had problems with the hip since her surgery nearly ten years ago.  She has recently lost fifty-five pounds and speculates whether this might have affected her hip alignment.  She has been doing well overall except for the recent onset of symptoms.     Assessment & Plan: Visit Diagnoses:  1. Status post total replacement of left hip   2. Heterotopic ossification of joint     Plan: Assessment and Plan Assessment & Plan Left hip pain and abductor lurch after total hip arthroplasty with heterotopic ossification Ten years post total hip arthroplasty and ORIF for acetabular fracture. Recurrence of left hip pain and abductor lurch. Radiographs show heterotopic ossification, no loosening or infection. Pain likely from scar tissue and inflammation. - Initiate physical therapy for hip strengthening. - Recommend Naprosyn 440 mg twice daily with meals. - Arrange follow-up in four weeks.      Follow-Up Instructions: No follow-ups on file.    Ortho Exam  Patient is alert, oriented, no adenopathy, well-dressed, normal affect, normal respiratory effort. Physical Exam MUSCULOSKELETAL: Abductor lurch on the left side. Pain-free external rotation of the hip. External rotation to 30 degrees, internal rotation to 20 degrees. No pain with passive range of motion of the hip. NEUROLOGICAL: No radicular symptoms.      Imaging: No results found. No images are attached to the encounter.  Labs: Lab Results  Component Value Date   HGBA1C 7.0 (H) 04/24/2023   HGBA1C 11.3 (H) 11/25/2012   ESRSEDRATE 77 (H) 12/29/2012   ESRSEDRATE 104 (H) 11/25/2012   CRP 2.0 (H) 12/29/2012   CRP 31.0 (H) 11/25/2012   LABURIC 4.9 08/19/2007   REPTSTATUS 12/05/2012 FINAL 11/30/2012   REPTSTATUS 12/04/2012 FINAL 11/30/2012   GRAMSTAIN  11/30/2012    NO WBC SEEN NO SQUAMOUS EPITHELIAL CELLS SEEN FEW GRAM NEGATIVE RODS RARE GRAM POSITIVE COCCI IN PAIRS   GRAMSTAIN  11/30/2012    NO WBC SEEN NO SQUAMOUS EPITHELIAL CELLS SEEN FEW GRAM NEGATIVE RODS RARE GRAM POSITIVE COCCI IN PAIRS   CULT NO ANAEROBES ISOLATED 11/30/2012   CULT  11/30/2012    FEW MICROAEROPHILIC STREPTOCOCCI Note: Standardized susceptibility testing for this organism is not available.     Lab Results  Component Value Date   ALBUMIN 3.9 03/25/2023   ALBUMIN 3.6 02/14/2023   ALBUMIN 3.8 05/08/2022    No results found  for: MG Lab Results  Component Value Date   VD25OH 32 02/18/2023    No results found for: PREALBUMIN    Latest Ref Rng & Units 04/24/2023    1:18 PM 03/25/2023    5:00 AM 02/18/2023   12:00 AM  CBC EXTENDED  WBC 4.0 - 10.5 K/uL 6.9  8.3  8.7   RBC 3.87 - 5.11 MIL/uL 4.44  4.26  4.08   Hemoglobin 12.0 - 15.0 g/dL 88.7  88.9  89.3   HCT 36.0 - 46.0 % 36.1  35.2  34.2   Platelets 150 - 400 K/uL 339  384  330   NEUT# 1.7 - 7.7 K/uL  5.7    Lymph# 0.7 - 4.0 K/uL  1.9       There is no height or weight on file to calculate BMI.  Orders:  Orders  Placed This Encounter  Procedures   XR HIP UNILAT W OR W/O PELVIS 2-3 VIEWS LEFT   Ambulatory referral to Physical Therapy   No orders of the defined types were placed in this encounter.    Procedures: No procedures performed  Clinical Data: No additional findings.  ROS:  All other systems negative, except as noted in the HPI. Review of Systems  Objective: Vital Signs: There were no vitals taken for this visit.  Specialty Comments:  No specialty comments available.  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   S/P repeat low transverse C-section 09/10/2013   Cellulitis and abscess 12/22/2012   Diabetes (HCC) 11/25/2012   Past Medical History:  Diagnosis Date   Abscess 11/14/2012   RIGHT FINGER   Anemia    after surgeries   Anxiety    Arthritis    Cataracts, bilateral    Chronic back pain    Complication of anesthesia    has had to have blood transfusions after several surgeries   Diabetes mellitus    type 1   Diabetic neuropathy (HCC)    Herpes virus disease    Hip fracture (HCC)    left side   History of blood transfusion 09/17/2011   left  hip- pinning   Neuromuscular disorder (HCC)    neuropathy   Shortness of breath dyspnea    with exertion   Urinary tract infection    hx of    Family History  Problem Relation Age of Onset   Diabetes Mother    Heart disease Mother    Heart attack Father    Asthma Son    Healthy Son    Asthma Son    Healthy Son    Healthy Daughter     Past Surgical History:  Procedure Laterality Date   BILATERAL SALPINGECTOMY     CATARACT EXTRACTION     left   CATARACT EXTRACTION W/PHACO  06/12/2012   Procedure: CATARACT EXTRACTION PHACO AND INTRAOCULAR LENS PLACEMENT (IOC);  Surgeon: Jestine Bunnell, MD;  Location: [facility] OR;  Service: Ophthalmology;  Laterality: Right;   CATARACT EXTRACTION W/PHACO   06/15/2012   Procedure: CATARACT EXTRACTION PHACO AND INTRAOCULAR LENS PLACEMENT (IOC);  Surgeon: Jestine Bunnell, MD;  Location: [facility] OR;  Service: Ophthalmology;  Laterality: Left;   CESAREAN SECTION     x 3   CESAREAN SECTION WITH BILATERAL TUBAL LIGATION Bilateral 09/10/2013   Procedure: REPEAT CESAREAN SECTION WITH BILATERAL TUBAL LIGATION;  Surgeon: Jon CINDERELLA Rummer, MD;  Location: [facility]  ORS;  Service: Obstetrics;  Laterality: Bilateral;   CHOLECYSTECTOMY N/A 04/30/2023   Procedure: LAPAROSCOPIC CHOLECYSTECTOMY;  Surgeon: Dasie Leonor CROME, MD;  Location: [facility] OR;  Service: General;  Laterality: N/A;   EYE SURGERY Bilateral    FRACTURE SURGERY     surgery left hip   HAND SURGERY Right 11/2012   I & D EXTREMITY Right 11/23/2012   Procedure: IRRIGATION AND DEBRIDEMENT EXTREMITY;  Surgeon: Alm DELENA Hummer, MD;  Location: [facility] OR;  Service: Orthopedics;  Laterality: Right;   I & D EXTREMITY Right 11/30/2012   Procedure: IRRIGATION AND DEBRIDEMENT Right hand Abscess;  Surgeon: Alm DELENA Hummer, MD;  Location: [facility] OR;  Service: Orthopedics;  Laterality: Right;   TOTAL HIP ARTHROPLASTY Left 10/12/2014   Procedure: TOTAL HIP ARTHROPLASTY;  Surgeon: Jerona Harden GAILS, MD;  Location: [facility] OR;  Service: Orthopedics;  Laterality: Left;   Social History   Occupational History   Not on file  Tobacco Use   Smoking status: Never    Passive exposure: Past   Smokeless tobacco: Never  Vaping Use   Vaping status: Never Used  Substance and Sexual Activity   Alcohol use: No    Alcohol/week: 0.0 standard drinks of alcohol   Drug use: No   Sexual activity: Yes    Partners: Male    Birth control/protection: Surgical    Comment: BTL

## 2024-07-06 NOTE — Therapy (Incomplete)
 OUTPATIENT PHYSICAL THERAPY LOWER EXTREMITY EVALUATION   Patient Name: [name] MRN: [id number] DOB:1975/09/20, 48 y.o., female Today's Date: 07/06/2024  END OF SESSION:   Past Medical History:  Diagnosis Date   Abscess 11/14/2012   RIGHT FINGER   Anemia    after surgeries   Anxiety    Arthritis    Cataracts, bilateral    Chronic back pain    Complication of anesthesia    has had to have blood transfusions after several surgeries   Diabetes mellitus    type 1   Diabetic neuropathy (HCC)    Herpes virus disease    Hip fracture (HCC)    left side   History of blood transfusion 09/17/2011   left  hip- pinning   Neuromuscular disorder (HCC)    neuropathy   Shortness of breath dyspnea    with exertion   Urinary tract infection    hx of   Past Surgical History:  Procedure Laterality Date   BILATERAL SALPINGECTOMY     CATARACT EXTRACTION     left   CATARACT EXTRACTION W/PHACO  06/12/2012   Procedure: CATARACT EXTRACTION PHACO AND INTRAOCULAR LENS PLACEMENT (IOC);  Surgeon: Jestine Bunnell, MD;  Location: [facility] OR;  Service: Ophthalmology;  Laterality: Right;   CATARACT EXTRACTION W/PHACO  06/15/2012   Procedure: CATARACT EXTRACTION PHACO AND INTRAOCULAR LENS PLACEMENT (IOC);  Surgeon: Jestine Bunnell, MD;  Location: [facility] OR;  Service: Ophthalmology;  Laterality: Left;   CESAREAN SECTION     x 3   CESAREAN SECTION WITH BILATERAL TUBAL LIGATION Bilateral 09/10/2013   Procedure: REPEAT CESAREAN SECTION WITH BILATERAL TUBAL LIGATION;  Surgeon: Jon CINDERELLA Rummer, MD;  Location: [facility] ORS;  Service: Obstetrics;  Laterality: Bilateral;   CHOLECYSTECTOMY N/A 04/30/2023   Procedure: LAPAROSCOPIC CHOLECYSTECTOMY;  Surgeon: Dasie Leonor CROME, MD;  Location: [facility] OR;  Service: General;  Laterality: N/A;   EYE SURGERY Bilateral    FRACTURE SURGERY     surgery left hip   HAND SURGERY Right 11/2012   I & D EXTREMITY Right 11/23/2012   Procedure: IRRIGATION AND DEBRIDEMENT EXTREMITY;  Surgeon:  Alm DELENA Hummer, MD;  Location: [facility] OR;  Service: Orthopedics;  Laterality: Right;   I & D EXTREMITY Right 11/30/2012   Procedure: IRRIGATION AND DEBRIDEMENT Right hand Abscess;  Surgeon: Alm DELENA Hummer, MD;  Location: [facility] OR;  Service: Orthopedics;  Laterality: Right;   TOTAL HIP ARTHROPLASTY Left 10/12/2014   Procedure: TOTAL HIP ARTHROPLASTY;  Surgeon: Jerona Harden GAILS, MD;  Location: [facility] OR;  Service: Orthopedics;  Laterality: Left;   Patient Active Problem List   Diagnosis Date Noted   Neuralgia 01/30/2022   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   THERAPY DIAG:  No diagnosis found.  Rationale for Evaluation and Treatment: Rehabilitation  ONSET DATE: ***  SUBJECTIVE:   SUBJECTIVE STATEMENT: ***  PERTINENT HISTORY: PN from Dr. Harden 09/18/23: History of Present Illness [name] is a 48 year old female with a history of total hip arthroplasty who presents with left hip pain and limping.   She has been experiencing left hip pain and a return of limping for  the past three weeks, primarily associated with walking. The pain is located in the hip area, but she is unable to pinpoint a specific spot.   She underwent a total hip arthroplasty and internal fixation for a posterior wall acetabular fracture approximately ten years ago. She has not had problems with the hip since her surgery nearly ten years ago.   She has recently lost fifty-five pounds and speculates whether this might have affected her hip alignment.   She has been doing well overall except for the recent onset of symptoms. PAIN:  Are you having pain? Yes:  NPRS scale: *** Pain location: *** Pain description: *** Aggravating factors: *** Relieving factors: ***  PRECAUTIONS: {Therapy precautions:24002}  RED FLAGS: {PT Red Flags:29287}   WEIGHT BEARING RESTRICTIONS: {Yes ***/No:24003}  FALLS:  Has patient fallen in last 6 months? {fallsyesno:27318}  LIVING ENVIRONMENT: Lives with: {[facility] lives with:25569::lives with their family} Lives in: {Lives in:25570} Stairs: {opstairs:27293} Has following equipment at home: {Assistive devices:23999}  OCCUPATION: ***  PLOF: {PLOF:24004}  PATIENT GOALS: ***  NEXT MD VISIT: ***  OBJECTIVE:  Note: Objective measures were completed at Evaluation unless otherwise noted.  DIAGNOSTIC FINDINGS: ***  PATIENT SURVEYS:  {rehab surveys:24030}  COGNITION: Overall cognitive status: {cognition:24006}     SENSATION: {sensation:27233}  EDEMA:  {edema:24020}  MUSCLE LENGTH: Hamstrings: Right *** deg; Left *** deg Debby test: Right *** deg; Left *** deg  POSTURE: {posture:25561}  PALPATION: ***  LOWER EXTREMITY ROM:  {AROM/PROM:27142} ROM Right eval Left eval  Hip flexion    Hip extension    Hip abduction    Hip adduction    Hip internal rotation    Hip external rotation    Knee flexion    Knee extension    Ankle dorsiflexion    Ankle plantarflexion    Ankle inversion    Ankle eversion     (Blank rows = not tested)  LOWER EXTREMITY MMT:  MMT Right eval Left eval  Hip flexion    Hip extension    Hip abduction    Hip adduction    Hip internal rotation    Hip external rotation    Knee flexion    Knee extension    Ankle dorsiflexion    Ankle plantarflexion    Ankle inversion    Ankle eversion     (Blank rows = not tested)  LOWER EXTREMITY SPECIAL TESTS:  {LEspecialtests:26242}  FUNCTIONAL TESTS:  {Functional tests:24029}  GAIT: Distance walked: *** Assistive device utilized: {Assistive devices:23999} Level of assistance: {Levels of  assistance:24026} Comments: ***                                                                                                                                TREATMENT DATE:  [facility] Adult PT Treatment:  DATE: 10/08/23 Therapeutic Exercise: *** Manual Therapy: *** Neuromuscular re-ed: *** Therapeutic Activity: *** Modalities: *** Self Care: ***     PATIENT EDUCATION:  Education details: *** Person educated: {Person educated:25204} Education method: {Education Method:25205} Education comprehension: {Education Comprehension:25206}  HOME EXERCISE PROGRAM: ***  ASSESSMENT:  CLINICAL IMPRESSION: Patient is a 48 y.o. female who was seen today for physical therapy evaluation and treatment for  Z96.642 (ICD-10-CM) - Status post total replacement of left hip  M25.80 (ICD-10-CM) - Heterotopic ossification of joint  .   OBJECTIVE IMPAIRMENTS: {opptimpairments:25111}.   ACTIVITY LIMITATIONS: {activitylimitations:27494}  PARTICIPATION LIMITATIONS: {participationrestrictions:25113}  PERSONAL FACTORS: {Personal factors:25162} are also affecting patient's functional outcome.   REHAB POTENTIAL: {rehabpotential:25112}  CLINICAL DECISION MAKING: {clinical decision making:25114}  EVALUATION COMPLEXITY: {Evaluation complexity:25115}   GOALS: Goals reviewed with patient? {yes/no:20286}  SHORT TERM GOALS: Target date: *** *** Baseline: Goal status: INITIAL  2.  *** Baseline:  Goal status: INITIAL  3.  *** Baseline:  Goal status: INITIAL  4.  *** Baseline:  Goal status: INITIAL  5.  *** Baseline:  Goal status: INITIAL  6.  *** Baseline:  Goal status: INITIAL  LONG TERM GOALS: Target date: ***  *** Baseline:  Goal status: INITIAL  2.  *** Baseline:  Goal status: INITIAL  3.  *** Baseline:  Goal status: INITIAL  4.  *** Baseline:  Goal status: INITIAL  5.  *** Baseline:  Goal status: INITIAL  6.   *** Baseline:  Goal status: INITIAL   PLAN:  PT FREQUENCY: {rehab frequency:25116}  PT DURATION: {rehab duration:25117}  PLANNED INTERVENTIONS: {rehab planned interventions:25118::97110-Therapeutic exercises,97530- Therapeutic [phone]- Neuromuscular re-education,97535- Self Rjmz,02859- Manual therapy,Patient/Family education}  PLAN FOR NEXT SESSION: ***   Naw Lasala, PT 07/06/2024, 10:16 PM

## 2024-07-07 ENCOUNTER — Ambulatory Visit

## 2024-07-15 ENCOUNTER — Ambulatory Visit: Admitting: Orthopedic Surgery

## 2024-07-19 ENCOUNTER — Encounter: Payer: Self-pay | Admitting: Radiology

## 2024-07-19 NOTE — Therapy (Signed)
 OUTPATIENT PHYSICAL THERAPY LOWER EXTREMITY EVALUATION   Patient Name: [name] MRN: [id number] DOB:1976-02-20, 48 y.o., female Today's Date: 07/20/2024  END OF SESSION:  PT End of Session - 07/20/24 1125     Visit Number 1    Number of Visits 17    Date for Recertification  09/14/24    Authorization Type [employer] [facility]    PT Start Time 0815   arrived late   PT Stop Time 0845    PT Time Calculation (min) 30 min    Activity Tolerance Patient limited by pain    Behavior During Therapy [facility] for tasks assessed/performed          Past Medical History:  Diagnosis Date   Abscess 11/14/2012   RIGHT FINGER   Anemia    after surgeries   Anxiety    Arthritis    Cataracts, bilateral    Chronic back pain    Complication of anesthesia    has had to have blood transfusions after several surgeries   Diabetes mellitus    type 1   Diabetic neuropathy (HCC)    Herpes virus disease    Hip fracture (HCC)    left side   History of blood transfusion 09/17/2011   left  hip- pinning   Neuromuscular disorder (HCC)    neuropathy   Shortness of breath dyspnea    with exertion   Urinary tract infection    hx of   Past Surgical History:  Procedure Laterality Date   BILATERAL SALPINGECTOMY     CATARACT EXTRACTION     left   CATARACT EXTRACTION W/PHACO  06/12/2012   Procedure: CATARACT EXTRACTION PHACO AND INTRAOCULAR LENS PLACEMENT (IOC);  Surgeon: Jestine Bunnell, MD;  Location: [facility] OR;  Service: Ophthalmology;  Laterality: Right;   CATARACT EXTRACTION W/PHACO  06/15/2012   Procedure: CATARACT EXTRACTION PHACO AND INTRAOCULAR LENS PLACEMENT (IOC);  Surgeon: Jestine Bunnell, MD;  Location: [facility] OR;  Service: Ophthalmology;  Laterality: Left;   CESAREAN SECTION     x 3   CESAREAN SECTION WITH BILATERAL TUBAL LIGATION Bilateral 09/10/2013   Procedure: REPEAT CESAREAN SECTION WITH BILATERAL TUBAL LIGATION;  Surgeon: Jon CINDERELLA Rummer, MD;  Location: [facility] OR[facility];  Service: Obstetrics;  Laterality:  Bilateral;   CHOLECYSTECTOMY N/A 04/30/2023   Procedure: LAPAROSCOPIC CHOLECYSTECTOMY;  Surgeon: Dasie Leonor CROME, MD;  Location: [facility] OR;  Service: General;  Laterality: N/A;   EYE SURGERY Bilateral    FRACTURE SURGERY     surgery left hip   HAND SURGERY Right 11/2012   I & D EXTREMITY Right 11/23/2012   Procedure: IRRIGATION AND DEBRIDEMENT EXTREMITY;  Surgeon: Alm DELENA Hummer, MD;  Location: [facility] OR;  Service: Orthopedics;  Laterality: Right;   I & D EXTREMITY Right 11/30/2012   Procedure: IRRIGATION AND DEBRIDEMENT Right hand Abscess;  Surgeon: Alm DELENA Hummer, MD;  Location: 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   S/P repeat low transverse C-section 09/10/2013   Cellulitis and abscess 12/22/2012   Diabetes (HCC) 11/25/2012    PCP: Shelda Atlas, MD  REFERRING PROVIDER: Harden Jerona GAILS, MD  REFERRING DIAG:  [phone] (ICD-10-CM) - Status post total replacement of left  hip M25.80 (ICD-10-CM) - Heterotopic ossification of joint  THERAPY DIAG:  Pain in left hip  Muscle weakness (generalized)  Other abnormalities of gait and mobility  Rationale for Evaluation and Treatment: Rehabilitation  ONSET DATE: 2 month  SUBJECTIVE:   SUBJECTIVE STATEMENT: Pt presents to PT with reports of L hip pain for roughly two months. Hx of L hip THA with internal fixation in 2016. Notes recent weight loss of over 50lbs since being on GLP-1, also states it feels like the rod is moving when I get up. Denies N/T or referral of pain post L groin.   PERTINENT HISTORY: DM I,  L THA and internal fixation  PAIN:  Are you having pain?  Yes: NPRS scale: 5/10 Worst: 10/10 Pain  location: left hip Pain description: sharp, sore Aggravating factors: walking, standing Relieving factors: rest  PRECAUTIONS: None  RED FLAGS: None   WEIGHT BEARING RESTRICTIONS: No  FALLS:  Has patient fallen in last 6 months? No  LIVING ENVIRONMENT: Lives with: lives with their family Lives in: House/apartment Stairs: No Has following equipment at home: None  OCCUPATION: [employer]  PLOF: Independent  PATIENT GOALS: decrease hip pain, improve walking  NEXT MD VISIT: PRN  OBJECTIVE:  Note: Objective measures were completed at Evaluation unless otherwise noted.  DIAGNOSTIC FINDINGS: See imaging   PATIENT SURVEYS:  LEFS  Extreme difficulty/unable (0), Quite a bit of difficulty (1), Moderate difficulty (2), Little difficulty (3), No difficulty (4) Survey date:  07/20/2024  Any of your usual work, housework or school activities 2  2. Usual hobbies, recreational or sporting activities 2  3. Getting into/out of the bath 2  4. Walking between rooms 2  5. Putting on socks/shoes 2  6. Squatting  2  7. Lifting an object, like a bag of groceries from the floor 2  8. Performing light activities around your home 2  9. Performing heavy activities around your home 2  10. Getting into/out of a car 2  11. Walking 2 blocks 0  12. Walking 1 mile 0  13. Going up/down 10 stairs (1 flight) 2  14. Standing for 1 hour 0  15.  sitting for 1 hour 0  16. Running on even ground 0  17. Running on uneven ground 0  18. Making sharp turns while running fast 0  19. Hopping  0  20. Rolling over in bed 2  Score total:  24/80     COGNITION: Overall cognitive status: Within functional limits for tasks assessed     SENSATION: WFL  POSTURE: rounded shoulders and forward head  PALPATION: TTP to L greater trochanter   LOWER EXTREMITY ROM:  Active ROM Right eval Left eval  Hip flexion  90  Hip extension    Hip abduction    Hip adduction    Hip internal rotation  15   Hip external rotation  20  Knee flexion    Knee extension    Ankle dorsiflexion    Ankle plantarflexion    Ankle inversion    Ankle eversion     (Blank rows = not tested)  LOWER EXTREMITY MMT:  MMT Right eval Left eval  Hip flexion 4 3  Hip extension    Hip abduction 4 2+  Hip adduction    Hip internal rotation 4 3+  Hip external rotation 4 3+  Knee flexion    Knee extension    Ankle dorsiflexion    Ankle plantarflexion    Ankle inversion  Ankle eversion     (Blank rows = not tested)  LOWER EXTREMITY SPECIAL TESTS:  Hip special tests: Trendelenburg test: positive left  FUNCTIONAL TESTS:  30 Second Sit to Stand: 8 reps  GAIT: Distance walked: 44ft Assistive device utilized: None Level of assistance: Complete Independence Comments: trendelenburg gait L                                                                                                                              TREATMENT DATE:  [facility] Adult PT Treatment:                                                DATE: 07/20/24 Therapeutic Exercise: STS x 10 Hooklying clamshell x 10 GTB Bridge with GTB x 5  PATIENT EDUCATION:  Education details: eval findings, LEFS, HEP, POC Person educated: Patient Education method: Explanation, Demonstration, and Handouts Education comprehension: verbalized understanding and returned demonstration  HOME EXERCISE PROGRAM: Access Code: [email or web address] URL: [email or web address] Date: 07/20/2024 Prepared by: Alm Kingdom  Exercises - Sit to Stand  - 1 x daily - 7 x weekly - 2 sets - 10 reps - Hooklying Clamshell with Resistance  - 1 x daily - 7 x weekly - 3 sets - 10 reps - blue band hold - Supine Bridge with Resistance Band  - 1 x daily - 7 x weekly - 3 sets - 10 reps - blue band hold  ASSESSMENT:  CLINICAL IMPRESSION: Patient is a 48 y.o. female who was seen today for physical therapy evaluation and treatment for subacute L hip pain and discomfort. Physical  findings are consistent with MD impression as pt demonstrates antalgic trendelenburg gait, decreased lateral gluteal muscle strength, and pain to palpation of L greater trochanteric muscle group. LEFS score shows severe disability in performance of home ADLs and higher level activities. Pt would benefit from skilled PT services in order to decrease pain and improve gait/standing tolerance.   OBJECTIVE IMPAIRMENTS: Abnormal gait, decreased activity tolerance, decreased balance, decreased endurance, decreased mobility, difficulty walking, decreased ROM, decreased strength, and pain.   ACTIVITY LIMITATIONS: carrying, lifting, standing, squatting, stairs, transfers, and locomotion level  PARTICIPATION LIMITATIONS: meal prep, cleaning, driving, shopping, community activity, occupation, yard work, and school  PERSONAL FACTORS: Time since onset of injury/illness/exacerbation and 1-2 comorbidities: DM I,  L THA and internal fixation are also affecting patient's functional outcome.   REHAB POTENTIAL: Good  CLINICAL DECISION MAKING: Evolving/moderate complexity  EVALUATION COMPLEXITY: Moderate   GOALS: Goals reviewed with patient? No  SHORT TERM GOALS: Target date: 08/10/2024   Pt will be compliant and knowledgeable with initial HEP for improved comfort and carryover Baseline: initial HEP given  Goal status: INITIAL  2.  Pt will self report left hip pain no greater than 7/10 for improved comfort and functional ability Baseline: 10/10  at worst Goal status: INITIAL   LONG TERM GOALS: Target date: 09/14/2024   Pt will improve LEFS to no less than 40/80 as proxy for functional improvement with home ADLs and higher level community activity Baseline: 24/80 Goal status: INITIAL   2.  Pt will self report left hip pain no greater than 4/10 for improved comfort and functional ability Baseline: 10/10 at worst Goal status: INITIAL   3.  Pt will increase 30 Second Sit to Stand rep count to no less  than 10 reps for improved balance, strength, and functional mobility Baseline: 8 reps  Goal status: INITIAL   4.  Pt will improve L hip MMT to at least 4/5 for improved comfort and functional mobility while decreasing pain Baseline: see chart Goal status: INITIAL   PLAN:  PT FREQUENCY: 1-2x/week  PT DURATION: 8 weeks  PLANNED INTERVENTIONS: 97164- PT Re-evaluation, 97110-Therapeutic exercises, 97530- Therapeutic activity, [id number]- Neuromuscular re-education, 97535- Self Care, 02859- Manual therapy, [id number]- Gait training, [phone]- Electrical stimulation (unattended), [id number]- Electrical stimulation (manual), 20560 (1-2 muscles), 20561 (3+ muscles)- Dry Needling, and Patient/Family education  PLAN FOR NEXT SESSION: assess HEP response, proximal hip strengthening   Referring diagnosis?  S03.357 (ICD-10-CM) - Status post total replacement of left hip M25.80 (ICD-10-CM) - Heterotopic ossification of joint  Treatment diagnosis? (if different than referring diagnosis)  Pain in left hip Muscle weakness (generalized) Other abnormalities of gait and mobility  What was this (referring dx) caused by? [x]  Surgery []  Fall [x]  Ongoing issue []  Arthritis []  Other: ____________  Laterality: []  Rt [x]  Lt []  Both  Check all possible CPT codes:  *CHOOSE 10 OR LESS*    See Planned Interventions listed in the Plan section of the Evaluation.    Alm JAYSON Kingdom, PT 07/20/2024, 11:32 AM

## 2024-07-20 ENCOUNTER — Other Ambulatory Visit: Payer: Self-pay

## 2024-07-20 ENCOUNTER — Ambulatory Visit: Attending: Orthopedic Surgery

## 2024-07-20 DIAGNOSIS — M6281 Muscle weakness (generalized): Secondary | ICD-10-CM | POA: Insufficient documentation

## 2024-07-20 DIAGNOSIS — R2689 Other abnormalities of gait and mobility: Secondary | ICD-10-CM | POA: Diagnosis present

## 2024-07-20 DIAGNOSIS — M25552 Pain in left hip: Secondary | ICD-10-CM | POA: Insufficient documentation

## 2024-07-20 DIAGNOSIS — M2580 Other specified joint disorders, unspecified joint: Secondary | ICD-10-CM | POA: Diagnosis not present

## 2024-07-20 DIAGNOSIS — Z96642 Presence of left artificial hip joint: Secondary | ICD-10-CM | POA: Insufficient documentation

## 2024-08-02 ENCOUNTER — Ambulatory Visit

## 2024-08-02 DIAGNOSIS — M25552 Pain in left hip: Secondary | ICD-10-CM | POA: Diagnosis not present

## 2024-08-02 DIAGNOSIS — R2689 Other abnormalities of gait and mobility: Secondary | ICD-10-CM

## 2024-08-02 DIAGNOSIS — M6281 Muscle weakness (generalized): Secondary | ICD-10-CM

## 2024-08-02 NOTE — Therapy (Signed)
 OUTPATIENT PHYSICAL THERAPY TREATMENT   Patient Name: [name] MRN: [id number] DOB:[birth year], 48 y.o., female Today's Date: 08/02/2024  END OF SESSION:  PT End of Session - 08/02/24 0802     Visit Number 2    Number of Visits 17    Date for Recertification  09/14/24    Authorization Type [employer] MCR    PT Start Time 0802    PT Stop Time 0842    PT Time Calculation (min) 40 min    Activity Tolerance Patient limited by pain    Behavior During Therapy [facility] for tasks assessed/performed           Past Medical History:  Diagnosis Date   Abscess 11/14/2012   RIGHT FINGER   Anemia    after surgeries   Anxiety    Arthritis    Cataracts, bilateral    Chronic back pain    Complication of anesthesia    has had to have blood transfusions after several surgeries   Diabetes mellitus    type 1   Diabetic neuropathy (HCC)    Herpes virus disease    Hip fracture (HCC)    left side   History of blood transfusion 09/17/2011   left  hip- pinning   Neuromuscular disorder (HCC)    neuropathy   Shortness of breath dyspnea    with exertion   Urinary tract infection    hx of   Past Surgical History:  Procedure Laterality Date   BILATERAL SALPINGECTOMY     CATARACT EXTRACTION     left   CATARACT EXTRACTION W/PHACO  06/12/2012   Procedure: CATARACT EXTRACTION PHACO AND INTRAOCULAR LENS PLACEMENT (IOC);  Surgeon: Jestine Bunnell, MD;  Location: [facility] OR;  Service: Ophthalmology;  Laterality: Right;   CATARACT EXTRACTION W/PHACO  06/15/2012   Procedure: CATARACT EXTRACTION PHACO AND INTRAOCULAR LENS PLACEMENT (IOC);  Surgeon: Jestine Bunnell, MD;  Location: [facility] OR;  Service: Ophthalmology;  Laterality: Left;   CESAREAN SECTION     x 3   CESAREAN SECTION WITH BILATERAL TUBAL LIGATION Bilateral 09/10/2013   Procedure: REPEAT CESAREAN SECTION WITH BILATERAL TUBAL LIGATION;  Surgeon: Jon CINDERELLA Rummer, MD;  Location: [facility] ORS;  Service: Obstetrics;  Laterality: Bilateral;   CHOLECYSTECTOMY  N/A 04/30/2023   Procedure: LAPAROSCOPIC CHOLECYSTECTOMY;  Surgeon: Dasie Leonor CROME, MD;  Location: [facility] OR;  Service: General;  Laterality: N/A;   EYE SURGERY Bilateral    FRACTURE SURGERY     surgery left hip   HAND SURGERY Right 11/2012   I & D EXTREMITY Right 11/23/2012   Procedure: IRRIGATION AND DEBRIDEMENT EXTREMITY;  Surgeon: Alm DELENA Hummer, MD;  Location: [facility] OR;  Service: Orthopedics;  Laterality: Right;   I & D EXTREMITY Right 11/30/2012   Procedure: IRRIGATION AND DEBRIDEMENT Right hand Abscess;  Surgeon: Alm DELENA Hummer, MD;  Location: [facility] OR;  Service: Orthopedics;  Laterality: Right;   TOTAL HIP ARTHROPLASTY Left 10/12/2014   Procedure: TOTAL HIP ARTHROPLASTY;  Surgeon: Jerona Harden GAILS, MD;  Location: [facility] OR;  Service: Orthopedics;  Laterality: Left;   Patient Active Problem List   Diagnosis Date Noted   Neuralgia 01/30/2022   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   S/P repeat low transverse C-section 09/10/2013   Cellulitis and abscess 12/22/2012   Diabetes (HCC) 11/25/2012    PCP: Shelda Atlas, MD  REFERRING PROVIDER: Harden Jerona GAILS, MD  REFERRING DIAG:  [phone] (ICD-10-CM) - Status post total replacement of left hip M25.80 (ICD-10-CM) -  Heterotopic ossification of joint  THERAPY DIAG:  Pain in left hip  Muscle weakness (generalized)  Other abnormalities of gait and mobility  Rationale for Evaluation and Treatment: Rehabilitation  ONSET DATE: 2 month  SUBJECTIVE:   SUBJECTIVE STATEMENT: Pt presents to PT with reports of L hip tightness but denies pain. Has been compliant with initial HEP with no adverse effect.   EVAL: Pt presents to PT with reports of L hip pain for roughly two months. Hx of L hip THA with internal fixation in 2016. Notes recent weight loss of over 50lbs since being on GLP-1, also states it feels like the rod is moving when I get up. Denies N/T or referral of pain post L groin.   PERTINENT HISTORY: DM I,   L THA and internal fixation  PAIN:  Are you having pain?  Yes: NPRS scale: 5/10 Worst: 10/10 Pain location: left hip Pain description: sharp, sore Aggravating factors: walking, standing Relieving factors: rest  PRECAUTIONS: None  RED FLAGS: None   WEIGHT BEARING RESTRICTIONS: No  FALLS:  Has patient fallen in last 6 months? No  LIVING ENVIRONMENT: Lives with: lives with their family Lives in: House/apartment Stairs: No Has following equipment at home: None  OCCUPATION: Retail - [employer]  PLOF: Independent  PATIENT GOALS: decrease hip pain, improve walking  NEXT MD VISIT: PRN  OBJECTIVE:  Note: Objective measures were completed at Evaluation unless otherwise noted.  DIAGNOSTIC FINDINGS: See imaging   PATIENT SURVEYS:  LEFS  Extreme difficulty/unable (0), Quite a bit of difficulty (1), Moderate difficulty (2), Little difficulty (3), No difficulty (4) Survey date:  07/20/2024  Any of your usual work, housework or school activities 2  2. Usual hobbies, recreational or sporting activities 2  3. Getting into/out of the bath 2  4. Walking between rooms 2  5. Putting on socks/shoes 2  6. Squatting  2  7. Lifting an object, like a bag of groceries from the floor 2  8. Performing light activities around your home 2  9. Performing heavy activities around your home 2  10. Getting into/out of a car 2  11. Walking 2 blocks 0  12. Walking 1 mile 0  13. Going up/down 10 stairs (1 flight) 2  14. Standing for 1 hour 0  15.  sitting for 1 hour 0  16. Running on even ground 0  17. Running on uneven ground 0  18. Making sharp turns while running fast 0  19. Hopping  0  20. Rolling over in bed 2  Score total:  24/80     COGNITION: Overall cognitive status: Within functional limits for tasks assessed     SENSATION: WFL  POSTURE: rounded shoulders and forward head  PALPATION: TTP to L greater trochanter   LOWER EXTREMITY ROM:  Active ROM Right eval  Left eval  Hip flexion  90  Hip extension    Hip abduction    Hip adduction    Hip internal rotation  15  Hip external rotation  20  Knee flexion    Knee extension    Ankle dorsiflexion    Ankle plantarflexion    Ankle inversion    Ankle eversion     (Blank rows = not tested)  LOWER EXTREMITY MMT:  MMT Right eval Left eval  Hip flexion 4 3  Hip extension    Hip abduction 4 2+  Hip adduction    Hip internal rotation 4 3+  Hip external rotation 4 3+  Knee  flexion    Knee extension    Ankle dorsiflexion    Ankle plantarflexion    Ankle inversion    Ankle eversion     (Blank rows = not tested)  LOWER EXTREMITY SPECIAL TESTS:  Hip special tests: Trendelenburg test: positive left  FUNCTIONAL TESTS:  30 Second Sit to Stand: 8 reps  GAIT: Distance walked: 27ft Assistive device utilized: None Level of assistance: Complete Independence Comments: trendelenburg gait L                                                                                                                              TREATMENT DATE:  [facility] Adult PT Treatment:                                                DATE: 08/02/24 Therapeutic Exercise: NuStep lvl 5 UE/LE x 4 min for functional activity Hooklying clamshell 2x15 GTB Bridge with GTB 3x10 Supine SLR 2x10 L STS 3x10 GTB around knees Lateral walk YTB x 3 laps at counter Standing hip abd/ext 2x10 YTB  [facility] Adult PT Treatment:                                                DATE: 07/20/24 Therapeutic Exercise: STS x 10 Hooklying clamshell x 10 GTB Bridge with GTB x 5  PATIENT EDUCATION:  Education details: eval findings, LEFS, HEP, POC Person educated: Patient Education method: Explanation, Demonstration, and Handouts Education comprehension: verbalized understanding and returned demonstration  HOME EXERCISE PROGRAM: Access Code: [email or web address] URL: [email or web address] Date: 08/02/2024 Prepared by: Alm Kingdom  Exercises -  Sit to Stand  - 1 x daily - 7 x weekly - 2 sets - 10 reps - Hooklying Clamshell with Resistance  - 1 x daily - 7 x weekly - 3 sets - 10 reps - blue band hold - Supine Bridge with Resistance Band  - 1 x daily - 7 x weekly - 3 sets - 10 reps - blue band hold - Side Stepping with Resistance at Ankles and Counter Support  - 1 x daily - 7 x weekly - 3-4 reps - yellow band hold  ASSESSMENT:  CLINICAL IMPRESSION: Pt was able to complete all prescribed exercises with no adverse effect. Exercises today focused on improving lateral hip strength in order to decrease pain and improve functional mobility. Pt did note some dizziness near end of session due to blood sugar, began to resolve when she was leaving. Pt continues to benefit from skilled PT, will continue per POC.   EVAL: Patient is a 48 y.o. female who was seen today for physical therapy evaluation and treatment for subacute L hip pain and discomfort. Physical  findings are consistent with MD impression as pt demonstrates antalgic trendelenburg gait, decreased lateral gluteal muscle strength, and pain to palpation of L greater trochanteric muscle group. LEFS score shows severe disability in performance of home ADLs and higher level activities. Pt would benefit from skilled PT services in order to decrease pain and improve gait/standing tolerance.   OBJECTIVE IMPAIRMENTS: Abnormal gait, decreased activity tolerance, decreased balance, decreased endurance, decreased mobility, difficulty walking, decreased ROM, decreased strength, and pain.   ACTIVITY LIMITATIONS: carrying, lifting, standing, squatting, stairs, transfers, and locomotion level  PARTICIPATION LIMITATIONS: meal prep, cleaning, driving, shopping, community activity, occupation, yard work, and school  PERSONAL FACTORS: Time since onset of injury/illness/exacerbation and 1-2 comorbidities: DM I,  L THA and internal fixation are also affecting patient's functional outcome.   REHAB POTENTIAL:  Good  CLINICAL DECISION MAKING: Evolving/moderate complexity  EVALUATION COMPLEXITY: Moderate   GOALS: Goals reviewed with patient? No  SHORT TERM GOALS: Target date: 08/10/2024   Pt will be compliant and knowledgeable with initial HEP for improved comfort and carryover Baseline: initial HEP given  Goal status: INITIAL  2.  Pt will self report left hip pain no greater than 7/10 for improved comfort and functional ability Baseline: 10/10 at worst Goal status: INITIAL   LONG TERM GOALS: Target date: 09/14/2024   Pt will improve LEFS to no less than 40/80 as proxy for functional improvement with home ADLs and higher level community activity Baseline: 24/80 Goal status: INITIAL   2.  Pt will self report left hip pain no greater than 4/10 for improved comfort and functional ability Baseline: 10/10 at worst Goal status: INITIAL   3.  Pt will increase 30 Second Sit to Stand rep count to no less than 10 reps for improved balance, strength, and functional mobility Baseline: 8 reps  Goal status: INITIAL   4.  Pt will improve L hip MMT to at least 4/5 for improved comfort and functional mobility while decreasing pain Baseline: see chart Goal status: INITIAL   PLAN:  PT FREQUENCY: 1-2x/week  PT DURATION: 8 weeks  PLANNED INTERVENTIONS: 97164- PT Re-evaluation, 97110-Therapeutic exercises, 97530- Therapeutic activity, [id number]- Neuromuscular re-education, 97535- Self Care, [id number]- Manual therapy, [id number]- Gait training, [id number]- Electrical stimulation (unattended), [id number]- Electrical stimulation (manual), 20560 (1-2 muscles), 20561 (3+ muscles)- Dry Needling, and Patient/Family education  PLAN FOR NEXT SESSION: assess HEP response, proximal hip strengthening   Referring diagnosis?  S03.357 (ICD-10-CM) - Status post total replacement of left hip M25.80 (ICD-10-CM) - Heterotopic ossification of joint  Treatment diagnosis? (if different than referring diagnosis)  Pain in left hip Muscle  weakness (generalized) Other abnormalities of gait and mobility  What was this (referring dx) caused by? [x]  Surgery []  Fall [x]  Ongoing issue []  Arthritis []  Other: ____________  Laterality: []  Rt [x]  Lt []  Both  Check all possible CPT codes:  *CHOOSE 10 OR LESS*    See Planned Interventions listed in the Plan section of the Evaluation.    Alm JAYSON Kingdom, PT 08/02/2024, 8:48 AM

## 2024-08-05 ENCOUNTER — Telehealth: Payer: Self-pay

## 2024-08-05 ENCOUNTER — Ambulatory Visit

## 2024-08-05 NOTE — Telephone Encounter (Signed)
 [place] Odessia Scot  PT, DPT

## 2024-08-09 ENCOUNTER — Ambulatory Visit

## 2024-08-09 DIAGNOSIS — M25552 Pain in left hip: Secondary | ICD-10-CM

## 2024-08-09 DIAGNOSIS — R2689 Other abnormalities of gait and mobility: Secondary | ICD-10-CM

## 2024-08-09 DIAGNOSIS — M6281 Muscle weakness (generalized): Secondary | ICD-10-CM

## 2024-08-09 NOTE — Therapy (Signed)
 OUTPATIENT PHYSICAL THERAPY TREATMENT   Patient Name: [name] MRN: [id number] DOB:1976/09/11, 48 y.o., female Today's Date: 08/09/2024  END OF SESSION:  PT End of Session - 08/09/24 0845     Visit Number 3    Number of Visits 17    Date for Recertification  09/14/24    Authorization Type [employer] MCR    PT Start Time 9153    PT Stop Time 0926    PT Time Calculation (min) 40 min    Activity Tolerance Patient limited by pain    Behavior During Therapy [facility] for tasks assessed/performed            Past Medical History:  Diagnosis Date   Abscess 11/14/2012   RIGHT FINGER   Anemia    after surgeries   Anxiety    Arthritis    Cataracts, bilateral    Chronic back pain    Complication of anesthesia    has had to have blood transfusions after several surgeries   Diabetes mellitus    type 1   Diabetic neuropathy (HCC)    Herpes virus disease    Hip fracture (HCC)    left side   History of blood transfusion 09/17/2011   left  hip- pinning   Neuromuscular disorder (HCC)    neuropathy   Shortness of breath dyspnea    with exertion   Urinary tract infection    hx of   Past Surgical History:  Procedure Laterality Date   BILATERAL SALPINGECTOMY     CATARACT EXTRACTION     left   CATARACT EXTRACTION W/PHACO  06/12/2012   Procedure: CATARACT EXTRACTION PHACO AND INTRAOCULAR LENS PLACEMENT (IOC);  Surgeon: Jestine Bunnell, MD;  Location: [facility] OR;  Service: Ophthalmology;  Laterality: Right;   CATARACT EXTRACTION W/PHACO  06/15/2012   Procedure: CATARACT EXTRACTION PHACO AND INTRAOCULAR LENS PLACEMENT (IOC);  Surgeon: Jestine Bunnell, MD;  Location: [facility] OR;  Service: Ophthalmology;  Laterality: Left;   CESAREAN SECTION     x 3   CESAREAN SECTION WITH BILATERAL TUBAL LIGATION Bilateral 09/10/2013   Procedure: REPEAT CESAREAN SECTION WITH BILATERAL TUBAL LIGATION;  Surgeon: Jon CINDERELLA Rummer, MD;  Location: [facility] ORS;  Service: Obstetrics;  Laterality: Bilateral;   CHOLECYSTECTOMY  N/A 04/30/2023   Procedure: LAPAROSCOPIC CHOLECYSTECTOMY;  Surgeon: Dasie Leonor CROME, MD;  Location: [facility] OR;  Service: General;  Laterality: N/A;   EYE SURGERY Bilateral    FRACTURE SURGERY     surgery left hip   HAND SURGERY Right 11/2012   I & D EXTREMITY Right 11/23/2012   Procedure: IRRIGATION AND DEBRIDEMENT EXTREMITY;  Surgeon: Alm DELENA Hummer, MD;  Location: [facility] OR;  Service: Orthopedics;  Laterality: Right;   I & D EXTREMITY Right 11/30/2012   Procedure: IRRIGATION AND DEBRIDEMENT Right hand Abscess;  Surgeon: Alm DELENA Hummer, MD;  Location: [facility] OR;  Service: Orthopedics;  Laterality: Right;   TOTAL HIP ARTHROPLASTY Left 10/12/2014   Procedure: TOTAL HIP ARTHROPLASTY;  Surgeon: Jerona Harden GAILS, MD;  Location: [facility] OR;  Service: Orthopedics;  Laterality: Left;   Patient Active Problem List   Diagnosis Date Noted   Neuralgia 01/30/2022   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   S/P repeat low transverse C-section 09/10/2013   Cellulitis and abscess 12/22/2012   Diabetes (HCC) 11/25/2012    PCP: Shelda Atlas, MD  REFERRING PROVIDER: Harden Jerona GAILS, MD  REFERRING DIAG:  [phone] (ICD-10-CM) - Status post total replacement of left hip M25.80 (ICD-10-CM) -  Heterotopic ossification of joint  THERAPY DIAG:  Pain in left hip  Muscle weakness (generalized)  Other abnormalities of gait and mobility  Rationale for Evaluation and Treatment: Rehabilitation  ONSET DATE: 2 month  SUBJECTIVE:   SUBJECTIVE STATEMENT: Pt presents to PT with reports of 5/10 L hip pain. Has been compliant with HEP.   EVAL: Pt presents to PT with reports of L hip pain for roughly two months. Hx of L hip THA with internal fixation in 2016. Notes recent weight loss of over 50lbs since being on GLP-1, also states it feels like the rod is moving when I get up. Denies N/T or referral of pain post L groin.   PERTINENT HISTORY: DM I,  L THA and internal fixation  PAIN:  Are  you having pain?  Yes: NPRS scale: 5/10 Worst: 10/10 Pain location: left hip Pain description: sharp, sore Aggravating factors: walking, standing Relieving factors: rest  PRECAUTIONS: None  RED FLAGS: None   WEIGHT BEARING RESTRICTIONS: No  FALLS:  Has patient fallen in last 6 months? No  LIVING ENVIRONMENT: Lives with: lives with their family Lives in: House/apartment Stairs: No Has following equipment at home: None  OCCUPATION: Retail - [employer]  PLOF: Independent  PATIENT GOALS: decrease hip pain, improve walking  NEXT MD VISIT: PRN  OBJECTIVE:  Note: Objective measures were completed at Evaluation unless otherwise noted.  DIAGNOSTIC FINDINGS: See imaging   PATIENT SURVEYS:  LEFS  Extreme difficulty/unable (0), Quite a bit of difficulty (1), Moderate difficulty (2), Little difficulty (3), No difficulty (4) Survey date:  07/20/2024  Any of your usual work, housework or school activities 2  2. Usual hobbies, recreational or sporting activities 2  3. Getting into/out of the bath 2  4. Walking between rooms 2  5. Putting on socks/shoes 2  6. Squatting  2  7. Lifting an object, like a bag of groceries from the floor 2  8. Performing light activities around your home 2  9. Performing heavy activities around your home 2  10. Getting into/out of a car 2  11. Walking 2 blocks 0  12. Walking 1 mile 0  13. Going up/down 10 stairs (1 flight) 2  14. Standing for 1 hour 0  15.  sitting for 1 hour 0  16. Running on even ground 0  17. Running on uneven ground 0  18. Making sharp turns while running fast 0  19. Hopping  0  20. Rolling over in bed 2  Score total:  24/80     COGNITION: Overall cognitive status: Within functional limits for tasks assessed     SENSATION: WFL  POSTURE: rounded shoulders and forward head  PALPATION: TTP to L greater trochanter   LOWER EXTREMITY ROM:  Active ROM Right eval Left eval  Hip flexion  90  Hip extension     Hip abduction    Hip adduction    Hip internal rotation  15  Hip external rotation  20  Knee flexion    Knee extension    Ankle dorsiflexion    Ankle plantarflexion    Ankle inversion    Ankle eversion     (Blank rows = not tested)  LOWER EXTREMITY MMT:  MMT Right eval Left eval  Hip flexion 4 3  Hip extension    Hip abduction 4 2+  Hip adduction    Hip internal rotation 4 3+  Hip external rotation 4 3+  Knee flexion    Knee extension  Ankle dorsiflexion    Ankle plantarflexion    Ankle inversion    Ankle eversion     (Blank rows = not tested)  LOWER EXTREMITY SPECIAL TESTS:  Hip special tests: Trendelenburg test: positive left  FUNCTIONAL TESTS:  30 Second Sit to Stand: 8 reps  GAIT: Distance walked: 64ft Assistive device utilized: None Level of assistance: Complete Independence Comments: trendelenburg gait L                                                                                                                              TREATMENT DATE:  [facility] Adult PT Treatment:                                                DATE: 08/09/24 NuStep lvl 5 UE/LE x 4 min for functional activity Bridge with blue band 3x10 Hooklying clamshell 2x15 blue band Supine SLR 2x10 L Hookling ball squeeze 2x10 - 5 hold STS 3x10 GTB around knees Step up 6in fwd L leading 2x10 1 UE Lateral walk YTB x 3 laps at counter Standing hip abd 2x10 YTB  [facility] Adult PT Treatment:                                                DATE: 08/02/24 Therapeutic Exercise: NuStep lvl 5 UE/LE x 4 min for functional activity Hooklying clamshell 2x15 GTB Bridge with GTB 3x10 Supine SLR 2x10 L STS 3x10 GTB around knees Lateral walk YTB x 3 laps at counter Standing hip abd/ext 2x10 YTB  [facility] Adult PT Treatment:                                                DATE: 07/20/24 Therapeutic Exercise: STS x 10 Hooklying clamshell x 10 GTB Bridge with GTB x 5  PATIENT EDUCATION:  Education details:  eval findings, LEFS, HEP, POC Person educated: Patient Education method: Explanation, Demonstration, and Handouts Education comprehension: verbalized understanding and returned demonstration  HOME EXERCISE PROGRAM: Access Code: [email or web address] URL: [email or web address] Date: 08/02/2024 Prepared by: Alm Kingdom  Exercises - Sit to Stand  - 1 x daily - 7 x weekly - 2 sets - 10 reps - Hooklying Clamshell with Resistance  - 1 x daily - 7 x weekly - 3 sets - 10 reps - blue band hold - Supine Bridge with Resistance Band  - 1 x daily - 7 x weekly - 3 sets - 10 reps - blue band hold - Side Stepping with Resistance at Ankles and Counter Support  - 1 x daily -  7 x weekly - 3-4 reps - yellow band hold  ASSESSMENT:  CLINICAL IMPRESSION: Pt was able to complete all prescribed exercises with no adverse effect. Exercises today focused on improving lateral hip strength in order to decrease pain and improve functional mobility. Pt continues to benefit from skilled PT, will continue per POC.   EVAL: Patient is a 48 y.o. female who was seen today for physical therapy evaluation and treatment for subacute L hip pain and discomfort. Physical findings are consistent with MD impression as pt demonstrates antalgic trendelenburg gait, decreased lateral gluteal muscle strength, and pain to palpation of L greater trochanteric muscle group. LEFS score shows severe disability in performance of home ADLs and higher level activities. Pt would benefit from skilled PT services in order to decrease pain and improve gait/standing tolerance.   OBJECTIVE IMPAIRMENTS: Abnormal gait, decreased activity tolerance, decreased balance, decreased endurance, decreased mobility, difficulty walking, decreased ROM, decreased strength, and pain.   ACTIVITY LIMITATIONS: carrying, lifting, standing, squatting, stairs, transfers, and locomotion level  PARTICIPATION LIMITATIONS: meal prep, cleaning, driving, shopping, community  activity, occupation, yard work, and school  PERSONAL FACTORS: Time since onset of injury/illness/exacerbation and 1-2 comorbidities: DM I,  L THA and internal fixation are also affecting patient's functional outcome.   REHAB POTENTIAL: Good  CLINICAL DECISION MAKING: Evolving/moderate complexity  EVALUATION COMPLEXITY: Moderate   GOALS: Goals reviewed with patient? No  SHORT TERM GOALS: Target date: 08/10/2024   Pt will be compliant and knowledgeable with initial HEP for improved comfort and carryover Baseline: initial HEP given  Goal status: INITIAL  2.  Pt will self report left hip pain no greater than 7/10 for improved comfort and functional ability Baseline: 10/10 at worst Goal status: INITIAL   LONG TERM GOALS: Target date: 09/14/2024   Pt will improve LEFS to no less than 40/80 as proxy for functional improvement with home ADLs and higher level community activity Baseline: 24/80 Goal status: INITIAL   2.  Pt will self report left hip pain no greater than 4/10 for improved comfort and functional ability Baseline: 10/10 at worst Goal status: INITIAL   3.  Pt will increase 30 Second Sit to Stand rep count to no less than 10 reps for improved balance, strength, and functional mobility Baseline: 8 reps  Goal status: INITIAL   4.  Pt will improve L hip MMT to at least 4/5 for improved comfort and functional mobility while decreasing pain Baseline: see chart Goal status: INITIAL   PLAN:  PT FREQUENCY: 1-2x/week  PT DURATION: 8 weeks  PLANNED INTERVENTIONS: 97164- PT Re-evaluation, 97110-Therapeutic exercises, 97530- Therapeutic activity, [id number]- Neuromuscular re-education, 97535- Self Care, [id number]- Manual therapy, [id number]- Gait training, [phone]- Electrical stimulation (unattended), [id number]- Electrical stimulation (manual), 20560 (1-2 muscles), 20561 (3+ muscles)- Dry Needling, and Patient/Family education  PLAN FOR NEXT SESSION: assess HEP response, proximal hip  strengthening   Referring diagnosis?  S03.357 (ICD-10-CM) - Status post total replacement of left hip M25.80 (ICD-10-CM) - Heterotopic ossification of joint  Treatment diagnosis? (if different than referring diagnosis)  Pain in left hip Muscle weakness (generalized) Other abnormalities of gait and mobility  What was this (referring dx) caused by? [x]  Surgery []  Fall [x]  Ongoing issue []  Arthritis []  Other: ____________  Laterality: []  Rt [x]  Lt []  Both  Check all possible CPT codes:  *CHOOSE 10 OR LESS*    See Planned Interventions listed in the Plan section of the Evaluation.    Alm JAYSON Kingdom, PT 08/09/2024, 9:32 AM

## 2024-08-10 ENCOUNTER — Ambulatory Visit: Admitting: Physical Therapy

## 2024-08-10 ENCOUNTER — Encounter: Payer: Self-pay | Admitting: Physical Therapy

## 2024-08-10 DIAGNOSIS — M6281 Muscle weakness (generalized): Secondary | ICD-10-CM

## 2024-08-10 DIAGNOSIS — M25552 Pain in left hip: Secondary | ICD-10-CM | POA: Diagnosis not present

## 2024-08-10 DIAGNOSIS — R2689 Other abnormalities of gait and mobility: Secondary | ICD-10-CM

## 2024-08-10 NOTE — Therapy (Signed)
 OUTPATIENT PHYSICAL THERAPY TREATMENT   Patient Name: [name] MRN: [id number] DOB:05-01-1976, 48 y.o., female Today's Date: 08/10/2024  END OF SESSION:  PT End of Session - 08/10/24 1252     Visit Number 4    Number of Visits 17    Date for Recertification  09/14/24    Authorization Type [employer] M[employer]    Authorization Time Period 07/20/24-09/14/24    Authorization - Visit Number 4    Authorization - Number of Visits 10    PT Start Time 0845    PT Stop Time 0930    PT Time Calculation (min) 45 min             Past Medical History:  Diagnosis Date   Abscess 11/14/2012   RIGHT FINGER   Anemia    after surgeries   Anxiety    Arthritis    Cataracts, bilateral    Chronic back pain    Complication of anesthesia    has had to have blood transfusions after several surgeries   Diabetes mellitus    type 1   Diabetic neuropathy (HCC)    Herpes virus disease    Hip fracture (HCC)    left side   History of blood transfusion 09/17/2011   left  hip- pinning   Neuromuscular disorder (HCC)    neuropathy   Shortness of breath dyspnea    with exertion   Urinary tract infection    hx of   Past Surgical History:  Procedure Laterality Date   BILATERAL SALPINGECTOMY     CATARACT EXTRACTION     left   CATARACT EXTRACTION W/PHACO  06/12/2012   Procedure: CATARACT EXTRACTION PHACO AND INTRAOCULAR LENS PLACEMENT (IOC);  Surgeon: Jestine Bunnell, MD;  Location: [facility] OR;  Service: Ophthalmology;  Laterality: Right;   CATARACT EXTRACTION W/PHACO  06/15/2012   Procedure: CATARACT EXTRACTION PHACO AND INTRAOCULAR LENS PLACEMENT (IOC);  Surgeon: Jestine Bunnell, MD;  Location: [facility] OR;  Service: Ophthalmology;  Laterality: Left;   CESAREAN SECTION     x 3   CESAREAN SECTION WITH BILATERAL TUBAL LIGATION Bilateral 09/10/2013   Procedure: REPEAT CESAREAN SECTION WITH BILATERAL TUBAL LIGATION;  Surgeon: Jon CINDERELLA Rummer, MD;  Location: [facility] ORS;  Service: Obstetrics;  Laterality: Bilateral;    CHOLECYSTECTOMY N/A 04/30/2023   Procedure: LAPAROSCOPIC CHOLECYSTECTOMY;  Surgeon: Dasie Leonor CROME, MD;  Location: [facility] OR;  Service: General;  Laterality: N/A;   EYE SURGERY Bilateral    FRACTURE SURGERY     surgery left hip   HAND SURGERY Right 11/2012   I & D EXTREMITY Right 11/23/2012   Procedure: IRRIGATION AND DEBRIDEMENT EXTREMITY;  Surgeon: Alm DELENA Hummer, MD;  Location: [facility] OR;  Service: Orthopedics;  Laterality: Right;   I & D EXTREMITY Right 11/30/2012   Procedure: IRRIGATION AND DEBRIDEMENT Right hand Abscess;  Surgeon: Alm DELENA Hummer, MD;  Location: [facility] OR;  Service: Orthopedics;  Laterality: Right;   TOTAL HIP ARTHROPLASTY Left 10/12/2014   Procedure: TOTAL HIP ARTHROPLASTY;  Surgeon: Jerona Harden GAILS, MD;  Location: [facility] OR;  Service: Orthopedics;  Laterality: Left;   Patient Active Problem List   Diagnosis Date Noted   Neuralgia 01/30/2022   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 Has been compliant with HEP.   EVAL: Pt presents to PT with reports of L hip pain for roughly two months. Hx of L hip THA with internal fixation in 2016. Notes recent weight loss of over 50lbs since being on GLP-1, also states it feels like the rod is moving when I get up. Denies N/T or referral of pain post L groin.   PERTINENT HISTORY: DM I,  L THA and internal  fixation  PAIN:  Are you having pain?  Yes: NPRS scale: 5/10 Worst: 10/10 Pain location: left hip Pain description: sharp, sore Aggravating factors: walking, standing Relieving factors: rest  PRECAUTIONS: None  RED FLAGS: None   WEIGHT BEARING RESTRICTIONS: No  FALLS:  Has patient fallen in last 6 months? No  LIVING ENVIRONMENT: Lives with: lives with their family Lives in: House/apartment Stairs: No Has following equipment at home: None  OCCUPATION: Retail - [employer]  PLOF: Independent  PATIENT GOALS: decrease hip pain, improve walking  NEXT MD VISIT: PRN  OBJECTIVE:  Note: Objective measures were completed at Evaluation unless otherwise noted.  DIAGNOSTIC FINDINGS: See imaging   PATIENT SURVEYS:  LEFS  Extreme difficulty/unable (0), Quite a bit of difficulty (1), Moderate difficulty (2), Little difficulty (3), No difficulty (4) Survey date:  07/20/2024  Any of your usual work, housework or school activities 2  2. Usual hobbies, recreational or sporting activities 2  3. Getting into/out of the bath 2  4. Walking between rooms 2  5. Putting on socks/shoes 2  6. Squatting  2  7. Lifting an object, like a bag of groceries from the floor 2  8. Performing light activities around your home 2  9. Performing heavy activities around your home 2  10. Getting into/out of a car 2  11. Walking 2 blocks 0  12. Walking 1 mile 0  13. Going up/down 10 stairs (1 flight) 2  14. Standing for 1 hour 0  15.  sitting for 1 hour 0  16. Running on even ground 0  17. Running on uneven ground 0  18. Making sharp turns while running fast 0  19. Hopping  0  20. Rolling over in bed 2  Score total:  24/80     COGNITION: Overall cognitive status: Within functional limits for tasks assessed     SENSATION: WFL  POSTURE: rounded shoulders and forward head  PALPATION: TTP to L greater trochanter   LOWER EXTREMITY ROM:  Active ROM Right eval Left eval  Hip flexion   90  Hip extension    Hip abduction    Hip adduction    Hip internal rotation  15  Hip external rotation  20  Knee flexion    Knee extension    Ankle dorsiflexion    Ankle plantarflexion    Ankle inversion    Ankle eversion     (Blank rows = not tested)  LOWER EXTREMITY MMT:  MMT Right eval Left eval  Hip flexion 4 3  Hip extension    Hip abduction 4 2+  Hip adduction    Hip internal rotation 4 3+  Hip external rotation 4 3+  Knee  flexion    Knee extension    Ankle dorsiflexion    Ankle plantarflexion    Ankle inversion    Ankle eversion     (Blank rows = not tested)  LOWER EXTREMITY SPECIAL TESTS:  Hip special tests: Trendelenburg test: positive left  FUNCTIONAL TESTS:  30 Second Sit to Stand: 8 reps  GAIT: Distance walked: 45ft Assistive device utilized: None Level of assistance: Complete Independence Comments: trendelenburg gait L                                                                                                                              TREATMENT DATE:  [facility] Adult PT Treatment:                                                DATE: 08/10/24  Nustep L5 x 5 minutes  Left step up with cues to correct hip drop Hip hikes from edge of step  Hip hike with hip circles- edge of step  Side lying clam red band Side hip abduction- unable Side reverse clam- unable Seated ball squeeze with IR AROM     [facility] Adult PT Treatment:                                                DATE: 08/09/24 NuStep lvl 5 UE/LE x 4 min for functional activity Bridge with blue band 3x10 Hooklying clamshell 2x15 blue band Supine SLR 2x10 L Hookling ball squeeze 2x10 - 5 hold STS 3x10 GTB around knees Step up 6in fwd L leading 2x10 1 UE Lateral walk YTB x 3 laps at counter Standing hip abd 2x10 YTB  [facility] Adult PT Treatment:                                                DATE: 08/02/24 Therapeutic Exercise: NuStep lvl 5 UE/LE x 4 min for functional activity Hooklying  clamshell 2x15 GTB Bridge with GTB 3x10 Supine SLR 2x10 L STS 3x10 GTB around knees Lateral walk YTB x 3 laps at counter Standing hip abd/ext 2x10 YTB  [facility] Adult PT Treatment:                                                DATE: 07/20/24 Therapeutic Exercise: STS x 10 Hooklying clamshell x 10 GTB Bridge with GTB x 5  PATIENT EDUCATION:  Education details: eval findings, LEFS, HEP,  POC Person educated: Patient Education method: Explanation, Demonstration, and Handouts Education comprehension: verbalized understanding and returned demonstration  HOME EXERCISE PROGRAM: Access Code: [email or web address] URL: [email or web address] Date: 08/02/2024 Prepared by: Alm Kingdom  Exercises - Sit to Stand  - 1 x daily - 7 x weekly - 2 sets - 10 reps - Hooklying Clamshell with Resistance  - 1 x daily - 7 x weekly - 3 sets - 10 reps - blue band hold - Supine Bridge with Resistance Band  - 1 x daily - 7 x weekly - 3 sets - 10 reps - blue band hold - Side Stepping with Resistance at Ankles and Counter Support  - 1 x daily - 7 x weekly - 3-4 reps - yellow band hold  ASSESSMENT:  CLINICAL IMPRESSION: Pt was able to complete all prescribed exercises with no adverse effect. Exercises today focused on improving lateral hip strength in order to decrease pain and improve functional mobility. Pt continues to benefit from skilled PT, will continue per POC.   EVAL: Patient is a 48 y.o. female who was seen today for physical therapy evaluation and treatment for subacute L hip pain and discomfort. Physical findings are consistent with MD impression as pt demonstrates antalgic trendelenburg gait, decreased lateral gluteal muscle strength, and pain to palpation of L greater trochanteric muscle group. LEFS score shows severe disability in performance of home ADLs and higher level activities. Pt would benefit from skilled PT services in order to decrease pain and improve gait/standing tolerance.   OBJECTIVE  IMPAIRMENTS: Abnormal gait, decreased activity tolerance, decreased balance, decreased endurance, decreased mobility, difficulty walking, decreased ROM, decreased strength, and pain.   ACTIVITY LIMITATIONS: carrying, lifting, standing, squatting, stairs, transfers, and locomotion level  PARTICIPATION LIMITATIONS: meal prep, cleaning, driving, shopping, community activity, occupation, yard work, and school  PERSONAL FACTORS: Time since onset of injury/illness/exacerbation and 1-2 comorbidities: DM I,  L THA and internal fixation are also affecting patient's functional outcome.   REHAB POTENTIAL: Good  CLINICAL DECISION MAKING: Evolving/moderate complexity  EVALUATION COMPLEXITY: Moderate   GOALS: Goals reviewed with patient? No  SHORT TERM GOALS: Target date: 08/10/2024   Pt will be compliant and knowledgeable with initial HEP for improved comfort and carryover Baseline: initial HEP given  Goal status: MET   2.  Pt will self report left hip pain no greater than 7/10 for improved comfort and functional ability Baseline: 10/10 at worst Goal status: ONGOING  LONG TERM GOALS: Target date: 09/14/2024   Pt will improve LEFS to no less than 40/80 as proxy for functional improvement with home ADLs and higher level community activity Baseline: 24/80 Goal status: INITIAL   2.  Pt will self report left hip pain no greater than 4/10 for improved comfort and functional ability Baseline: 10/10 at worst Goal status: INITIAL   3.  Pt will increase 30 Second Sit to Stand rep count to no less than 10 reps for improved balance, strength, and functional mobility Baseline: 8 reps  Goal status: INITIAL   4.  Pt will improve L hip MMT to at least 4/5 for improved comfort and functional mobility while decreasing pain Baseline: see chart Goal status: INITIAL   PLAN:  PT FREQUENCY: 1-2x/week  PT DURATION: 8 weeks  PLANNED INTERVENTIONS: 97164- PT Re-evaluation, 97110-Therapeutic exercises,  97530- Therapeutic activity, [id number]- Neuromuscular re-education, 97535- Self Care, [id number]- Manual therapy, [id number]- Gait training, [phone]- Electrical stimulation (unattended), [id number]- Electrical stimulation (manual), 20560 (1-2 muscles), 20561 (3+ muscles)- Dry Needling, and Patient/Family education  PLAN  FOR NEXT SESSION: assess HEP response, proximal hip strengthening   Referring diagnosis?  S03.357 (ICD-10-CM) - Status post total replacement of left hip M25.80 (ICD-10-CM) - Heterotopic ossification of joint  Treatment diagnosis? (if different than referring diagnosis)  Pain in left hip Muscle weakness (generalized) Other abnormalities of gait and mobility  What was this (referring dx) caused by? [x]  Surgery []  Fall [x]  Ongoing issue []  Arthritis []  Other: ____________  Laterality: []  Rt [x]  Lt []  Both  Check all possible CPT codes:  *CHOOSE 10 OR LESS*    See Planned Interventions listed in the Plan section of the Evaluation.    Harlene Persons, PTA 08/10/24 12:53 PM Phone: [phone] Fax: [phone]

## 2024-08-16 ENCOUNTER — Ambulatory Visit

## 2024-08-16 DIAGNOSIS — M6281 Muscle weakness (generalized): Secondary | ICD-10-CM | POA: Insufficient documentation

## 2024-08-16 DIAGNOSIS — M25552 Pain in left hip: Secondary | ICD-10-CM | POA: Diagnosis present

## 2024-08-16 NOTE — Therapy (Signed)
 OUTPATIENT PHYSICAL THERAPY TREATMENT   Patient Name: [name] MRN: [id number] DOB:1976/04/30, 48 y.o., female Today's Date: 08/16/2024  END OF SESSION:  PT End of Session - 08/16/24 0859     Visit Number 5    Number of Visits 17    Date for Recertification  09/14/24    Authorization Type [employer] MCR    Authorization Time Period 07/20/24-09/14/24    Authorization - Number of Visits 10    PT Start Time 0850   pt late   PT Stop Time 0930    PT Time Calculation (min) 40 min    Activity Tolerance Patient tolerated treatment well              Past Medical History:  Diagnosis Date   Abscess 11/14/2012   RIGHT FINGER   Anemia    after surgeries   Anxiety    Arthritis    Cataracts, bilateral    Chronic back pain    Complication of anesthesia    has had to have blood transfusions after several surgeries   Diabetes mellitus    type 1   Diabetic neuropathy (HCC)    Herpes virus disease    Hip fracture (HCC)    left side   History of blood transfusion 09/17/2011   left  hip- pinning   Neuromuscular disorder (HCC)    neuropathy   Shortness of breath dyspnea    with exertion   Urinary tract infection    hx of   Past Surgical History:  Procedure Laterality Date   BILATERAL SALPINGECTOMY     CATARACT EXTRACTION     left   CATARACT EXTRACTION W/PHACO  06/12/2012   Procedure: CATARACT EXTRACTION PHACO AND INTRAOCULAR LENS PLACEMENT (IOC);  Surgeon: Jestine Bunnell, MD;  Location: [facility] OR;  Service: Ophthalmology;  Laterality: Right;   CATARACT EXTRACTION W/PHACO  06/15/2012   Procedure: CATARACT EXTRACTION PHACO AND INTRAOCULAR LENS PLACEMENT (IOC);  Surgeon: Jestine Bunnell, MD;  Location: [facility] OR;  Service: Ophthalmology;  Laterality: Left;   CESAREAN SECTION     x 3   CESAREAN SECTION WITH BILATERAL TUBAL LIGATION Bilateral 09/10/2013   Procedure: REPEAT CESAREAN SECTION WITH BILATERAL TUBAL LIGATION;  Surgeon: Jon CINDERELLA Rummer, MD;  Location: [facility] ORS;  Service:  Obstetrics;  Laterality: Bilateral;   CHOLECYSTECTOMY N/A 04/30/2023   Procedure: LAPAROSCOPIC CHOLECYSTECTOMY;  Surgeon: Dasie Leonor CROME, MD;  Location: [facility] OR;  Service: General;  Laterality: N/A;   EYE SURGERY Bilateral    FRACTURE SURGERY     surgery left hip   HAND SURGERY Right 11/2012   I & D EXTREMITY Right 11/23/2012   Procedure: IRRIGATION AND DEBRIDEMENT EXTREMITY;  Surgeon: Alm DELENA Hummer, MD;  Location: [facility] OR;  Service: Orthopedics;  Laterality: Right;   I & D EXTREMITY Right 11/30/2012   Procedure: IRRIGATION AND DEBRIDEMENT Right hand Abscess;  Surgeon: Alm DELENA Hummer, MD;  Location: [facility] OR;  Service: Orthopedics;  Laterality: Right;   TOTAL HIP ARTHROPLASTY Left 10/12/2014   Procedure: TOTAL HIP ARTHROPLASTY;  Surgeon: Jerona Harden GAILS, MD;  Location: [facility] OR;  Service: Orthopedics;  Laterality: Left;   Patient Active Problem List   Diagnosis Date Noted   Neuralgia 01/30/2022   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 Spasms with L hip. Has been doing HEP.  EVAL: Pt presents to PT with reports of L hip pain for roughly two months. Hx of L hip THA with internal fixation in 2016. Notes recent weight loss of over 50lbs since being on GLP-1, also states it feels like the rod is moving when I get up. Denies N/T or referral of pain post L groin.   PERTINENT HISTORY: DM I,  L THA and internal fixation  PAIN:   Are you having pain?  Yes: NPRS scale: 0/10 Worst: 10/10 Pain location: left hip Pain description: sharp, sore Aggravating factors: walking, standing Relieving factors: rest  PRECAUTIONS: None  RED FLAGS: None   WEIGHT BEARING RESTRICTIONS: No  FALLS:  Has patient fallen in last 6 months? No  LIVING ENVIRONMENT: Lives with: lives with their family Lives in: House/apartment Stairs: No Has following equipment at home: None  OCCUPATION: Retail - [employer]  PLOF: Independent  PATIENT GOALS: decrease hip pain, improve walking  NEXT MD VISIT: PRN  OBJECTIVE:  Note: Objective measures were completed at Evaluation unless otherwise noted.  DIAGNOSTIC FINDINGS: See imaging   PATIENT SURVEYS:  LEFS  Extreme difficulty/unable (0), Quite a bit of difficulty (1), Moderate difficulty (2), Little difficulty (3), No difficulty (4) Survey date:  07/20/2024  Any of your usual work, housework or school activities 2  2. Usual hobbies, recreational or sporting activities 2  3. Getting into/out of the bath 2  4. Walking between rooms 2  5. Putting on socks/shoes 2  6. Squatting  2  7. Lifting an object, like a bag of groceries from the floor 2  8. Performing light activities around your home 2  9. Performing heavy activities around your home 2  10. Getting into/out of a car 2  11. Walking 2 blocks 0  12. Walking 1 mile 0  13. Going up/down 10 stairs (1 flight) 2  14. Standing for 1 hour 0  15.  sitting for 1 hour 0  16. Running on even ground 0  17. Running on uneven ground 0  18. Making sharp turns while running fast 0  19. Hopping  0  20. Rolling over in bed 2  Score total:  24/80     COGNITION: Overall cognitive status: Within functional limits for tasks assessed     SENSATION: WFL  POSTURE: rounded shoulders and forward head  PALPATION: TTP to L greater trochanter   LOWER EXTREMITY ROM:  Active ROM Right eval Left eval  Hip flexion  90  Hip extension     Hip abduction    Hip adduction    Hip internal rotation  15  Hip external rotation  20  Knee flexion    Knee extension    Ankle dorsiflexion    Ankle plantarflexion    Ankle inversion    Ankle eversion     (Blank rows = not tested)  LOWER EXTREMITY MMT:  MMT Right eval Left eval  Hip flexion 4 3  Hip extension    Hip abduction 4 2+  Hip adduction    Hip internal rotation 4 3+  Hip external rotation 4 3+  Knee flexion  Knee extension    Ankle dorsiflexion    Ankle plantarflexion    Ankle inversion    Ankle eversion     (Blank rows = not tested)  LOWER EXTREMITY SPECIAL TESTS:  Hip special tests: Trendelenburg test: positive left  FUNCTIONAL TESTS:  30 Second Sit to Stand: 8 reps  GAIT: Distance walked: 70ft Assistive device utilized: None Level of assistance: Complete Independence Comments: trendelenburg gait L                                                                                                                              TREATMENT DATE:  08/16/24 Therapeutic Exercise:   Nustep x5 min  RTB clamshell x8x3s, YTB 2x8x3s hip marches YTB 2x6x3s BL  Seated RTB Leg extension 2x8x3s Seated RTB hamstring curl 2x8x3s  Therapeutic Activity: HEP reassessment and update YTB crab walks 2x12 feet Step up 6 inch 2x8x3s BUE support (left leg up first and right leg down first) YTB counter leg extensions 2x8x3s BUE support    [facility] Adult PT Treatment:                                                DATE: 08/10/24  Nustep L5 x 5 minutes  Left step up with cues to correct hip drop Hip hikes from edge of step  Hip hike with hip circles- edge of step  Side lying clam red band Side hip abduction- unable Side reverse clam- unable Seated ball squeeze with IR AROM     [facility] Adult PT Treatment:                                                DATE: 08/09/24 NuStep lvl 5 UE/LE x 4 min for functional activity Bridge with blue band 3x10 Hooklying clamshell  2x15 blue band Supine SLR 2x10 L Hookling ball squeeze 2x10 - 5 hold STS 3x10 GTB around knees Step up 6in fwd L leading 2x10 1 UE Lateral walk YTB x 3 laps at counter Standing hip abd 2x10 YTB  [facility] Adult PT Treatment:                                                DATE: 08/02/24 Therapeutic Exercise: NuStep lvl 5 UE/LE x 4 min for functional activity Hooklying clamshell 2x15 GTB Bridge with GTB 3x10 Supine SLR 2x10 L STS 3x10 GTB around knees Lateral walk YTB x 3 laps at counter Standing hip abd/ext 2x10 YTB  [facility] Adult PT Treatment:  DATE: 07/20/24 Therapeutic Exercise: STS x 10 Hooklying clamshell x 10 GTB Bridge with GTB x 5  PATIENT EDUCATION:  Education details: eval findings, LEFS, HEP, POC Person educated: Patient Education method: Explanation, Demonstration, and Handouts Education comprehension: verbalized understanding and returned demonstration  HOME EXERCISE PROGRAM: Access Code: [email or web address] URL: [email or web address] Date: 08/02/2024 Prepared by: Alm Kingdom  Exercises - Sit to Stand  - 1 x daily - 7 x weekly - 2 sets - 10 reps - Hooklying Clamshell with Resistance  - 1 x daily - 7 x weekly - 3 sets - 10 reps - blue band hold - Supine Bridge with Resistance Band  - 1 x daily - 7 x weekly - 3 sets - 10 reps - blue band hold - Side Stepping with Resistance at Ankles and Counter Support  - 1 x daily - 7 x weekly - 3-4 reps - yellow band hold  ASSESSMENT:  CLINICAL IMPRESSION: Patient tolerated treatment with no significant increases in pain with progressions in BL hip loading via isolated and functional exercises. Current deficits include: L hip strength, activity tolerance, and excessive pain. As a result, patient would continue to benefit from skilled PT to address said deficits via plan below.    EVAL: Patient is a 48 y.o. female who was seen today for physical therapy evaluation and treatment for  subacute L hip pain and discomfort. Physical findings are consistent with MD impression as pt demonstrates antalgic trendelenburg gait, decreased lateral gluteal muscle strength, and pain to palpation of L greater trochanteric muscle group. LEFS score shows severe disability in performance of home ADLs and higher level activities. Pt would benefit from skilled PT services in order to decrease pain and improve gait/standing tolerance.   OBJECTIVE IMPAIRMENTS: Abnormal gait, decreased activity tolerance, decreased balance, decreased endurance, decreased mobility, difficulty walking, decreased ROM, decreased strength, and pain.   ACTIVITY LIMITATIONS: carrying, lifting, standing, squatting, stairs, transfers, and locomotion level  PARTICIPATION LIMITATIONS: meal prep, cleaning, driving, shopping, community activity, occupation, yard work, and school  PERSONAL FACTORS: Time since onset of injury/illness/exacerbation and 1-2 comorbidities: DM I,  L THA and internal fixation are also affecting patient's functional outcome.   REHAB POTENTIAL: Good  CLINICAL DECISION MAKING: Evolving/moderate complexity  EVALUATION COMPLEXITY: Moderate   GOALS: Goals reviewed with patient? No  SHORT TERM GOALS: Target date: 08/10/2024   Pt will be compliant and knowledgeable with initial HEP for improved comfort and carryover Baseline: initial HEP given  Goal status: MET   2.  Pt will self report left hip pain no greater than 7/10 for improved comfort and functional ability Baseline: 10/10 at worst Goal status: ONGOING  LONG TERM GOALS: Target date: 09/14/2024   Pt will improve LEFS to no less than 40/80 as proxy for functional improvement with home ADLs and higher level community activity Baseline: 24/80 Goal status: INITIAL   2.  Pt will self report left hip pain no greater than 4/10 for improved comfort and functional ability Baseline: 10/10 at worst Goal status: INITIAL   3.  Pt will increase 30  Second Sit to Stand rep count to no less than 10 reps for improved balance, strength, and functional mobility Baseline: 8 reps  Goal status: INITIAL   4.  Pt will improve L hip MMT to at least 4/5 for improved comfort and functional mobility while decreasing pain Baseline: see chart Goal status: INITIAL   PLAN:  PT FREQUENCY: 1-2x/week  PT DURATION: 8 weeks  PLANNED INTERVENTIONS: 02835- PT Re-evaluation, 97110-Therapeutic  exercises, 97530- Therapeutic activity, [id number]- Neuromuscular re-education, [id number]- Self Care, [id number]- Manual therapy, [id number]- Gait training, [phone]- Electrical stimulation (unattended), [id number]- Electrical stimulation (manual), [phone] (1-2 muscles), [id number] (3+ muscles)- Dry Needling, and Patient/Family education  PLAN FOR NEXT SESSION: assess HEP response, proximal hip strengthening via isolated and functional exercises.  Referring diagnosis?  S03.357 (ICD-10-CM) - Status post total replacement of left hip M25.80 (ICD-10-CM) - Heterotopic ossification of joint  Treatment diagnosis? (if different than referring diagnosis)  Pain in left hip Muscle weakness (generalized) Other abnormalities of gait and mobility  What was this (referring dx) caused by? [x]  Surgery []  Fall [x]  Ongoing issue []  Arthritis []  Other: ____________  Laterality: []  Rt [x]  Lt []  Both  Check all possible CPT codes:  *CHOOSE 10 OR LESS*    See Planned Interventions listed in the Plan section of the Evaluation.    [place] Odessia Scot  PT, DPT

## 2024-08-19 ENCOUNTER — Ambulatory Visit: Admitting: Physical Therapy

## 2024-08-23 ENCOUNTER — Ambulatory Visit

## 2024-08-23 DIAGNOSIS — M25552 Pain in left hip: Secondary | ICD-10-CM

## 2024-08-23 DIAGNOSIS — M6281 Muscle weakness (generalized): Secondary | ICD-10-CM

## 2024-08-23 NOTE — Therapy (Addendum)
 " OUTPATIENT PHYSICAL THERAPY TREATMENT  PHYSICAL THERAPY UNPLANNED DISCHARGE SUMMARY   Visits from Start of Care: 6  Current functional level related to goals / functional outcomes: Current status unknown   Remaining deficits: Current status unknown   Education / Equipment: Pt has not returned since visit listed below  Patient goals were not assessed. Patient is being discharged due to not returning since the last visit.  (the note below was addended to include the above D/C summary on 10/14/24)  Patient Name: [name] MRN: [id number] DOB:1975/09/27, 48 y.o., female Today's Date: 08/23/2024  END OF SESSION:  PT End of Session - 08/23/24 0901     Visit Number 6    Number of Visits 17    Date for Recertification  09/14/24    Authorization Type [employer] [facility]    Authorization Time Period 07/20/24-09/14/24    Authorization - Number of Visits 10    PT Start Time 0850   pt late   PT Stop Time 0930    PT Time Calculation (min) 40 min    Activity Tolerance Patient tolerated treatment well               Past Medical History:  Diagnosis Date   Abscess 11/14/2012   RIGHT FINGER   Anemia    after surgeries   Anxiety    Arthritis    Cataracts, bilateral    Chronic back pain    Complication of anesthesia    has had to have blood transfusions after several surgeries   Diabetes mellitus    type 1   Diabetic neuropathy (HCC)    Herpes virus disease    Hip fracture (HCC)    left side   History of blood transfusion 09/17/2011   left  hip- pinning   Neuromuscular disorder (HCC)    neuropathy   Shortness of breath dyspnea    with exertion   Urinary tract infection    hx of   Past Surgical History:  Procedure Laterality Date   BILATERAL SALPINGECTOMY     CATARACT EXTRACTION     left   CATARACT EXTRACTION W/PHACO  06/12/2012   Procedure: CATARACT EXTRACTION PHACO AND INTRAOCULAR LENS PLACEMENT (IOC);  Surgeon: Jestine Bunnell, MD;  Location: [facility] OR;  Service:  Ophthalmology;  Laterality: Right;   CATARACT EXTRACTION W/PHACO  06/15/2012   Procedure: CATARACT EXTRACTION PHACO AND INTRAOCULAR LENS PLACEMENT (IOC);  Surgeon: Jestine Bunnell, MD;  Location: [facility] OR;  Service: Ophthalmology;  Laterality: Left;   CESAREAN SECTION     x 3   CESAREAN SECTION WITH BILATERAL TUBAL LIGATION Bilateral 09/10/2013   Procedure: REPEAT CESAREAN SECTION WITH BILATERAL TUBAL LIGATION;  Surgeon: Jon CINDERELLA Rummer, MD;  Location: [facility] ORS;  Service: Obstetrics;  Laterality: Bilateral;   CHOLECYSTECTOMY N/A 04/30/2023   Procedure: LAPAROSCOPIC CHOLECYSTECTOMY;  Surgeon: Dasie Leonor CROME, MD;  Location: [facility] OR;  Service: General;  Laterality: N/A;   EYE SURGERY Bilateral    FRACTURE SURGERY     surgery left hip   HAND SURGERY Right 11/2012   I & D EXTREMITY Right 11/23/2012   Procedure: IRRIGATION AND DEBRIDEMENT EXTREMITY;  Surgeon: Alm DELENA Hummer, MD;  Location: [facility] OR;  Service: Orthopedics;  Laterality: Right;   I & D EXTREMITY Right 11/30/2012   Procedure: IRRIGATION AND DEBRIDEMENT Right hand Abscess;  Surgeon: Alm DELENA Hummer, MD;  Location: [facility] OR;  Service: Orthopedics;  Laterality: Right;   TOTAL HIP ARTHROPLASTY Left 10/12/2014   Procedure: TOTAL HIP ARTHROPLASTY;  Surgeon: Jerona Harden GAILS, MD;  Location: [facility] OR;  Service: Orthopedics;  Laterality: Left;   Patient Active Problem List   Diagnosis Date Noted   Neuralgia 01/30/2022   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 Been doing HEP 50%; primarily SLS stuff w/ balance. Hip has been feeling stiff with no pain. Reports improvements in SLS balance and overall tolerance.  EVAL: Pt presents to PT with reports of L hip pain for roughly two months. Hx of L hip THA with internal fixation in 2016. Notes recent weight loss of over 50lbs since being on GLP-1, also states it feels like the rod is moving when I get up. Denies N/T or referral of pain post L groin.   PERTINENT HISTORY: DM I,  L THA and internal fixation  PAIN:  Are you having pain?  Yes: NPRS scale: 0/10 Worst: 10/10 Pain location: left hip Pain description: sharp, sore Aggravating factors: walking, standing Relieving factors: rest  PRECAUTIONS: None  RED FLAGS: None   WEIGHT BEARING RESTRICTIONS: No  FALLS:  Has patient fallen in last 6 months? No  LIVING ENVIRONMENT: Lives with: lives with their family Lives in: House/apartment Stairs: No Has following equipment at home: None  OCCUPATION: Retail - [employer]  PLOF: Independent  PATIENT GOALS: decrease hip pain, improve walking  NEXT MD VISIT: PRN  OBJECTIVE:  Note: Objective measures were completed at Evaluation unless otherwise noted.  DIAGNOSTIC FINDINGS: See imaging   PATIENT SURVEYS:  LEFS  Extreme difficulty/unable (0), Quite a bit of difficulty (1), Moderate difficulty (2), Little difficulty (3), No difficulty (4) Survey date:  07/20/2024  Any of your usual work, housework or school activities 2  2. Usual hobbies, recreational or sporting activities 2  3. Getting into/out of the bath 2  4. Walking between rooms 2  5. Putting on socks/shoes 2  6. Squatting  2  7. Lifting an object, like a bag of groceries from the floor 2  8. Performing light activities around your home 2  9. Performing heavy activities around your home 2  10. Getting into/out of a car 2  11. Walking 2  blocks 0  12. Walking 1 mile 0  13. Going up/down 10 stairs (1 flight) 2  14. Standing for 1 hour 0  15.  sitting for 1 hour 0  16. Running on even ground 0  17. Running on uneven ground 0  18. Making sharp turns while running fast 0  19. Hopping  0  20. Rolling over in bed 2  Score total:  24/80     COGNITION: Overall cognitive status: Within functional limits for tasks assessed     SENSATION: WFL  POSTURE: rounded shoulders and forward head  PALPATION: TTP to L greater trochanter   LOWER EXTREMITY ROM:  Active ROM Right eval Left eval  Hip flexion  90  Hip extension    Hip abduction    Hip adduction  Hip internal rotation  15  Hip external rotation  20  Knee flexion    Knee extension    Ankle dorsiflexion    Ankle plantarflexion    Ankle inversion    Ankle eversion     (Blank rows = not tested)  LOWER EXTREMITY MMT:  MMT Right eval Left eval  Hip flexion 4 3  Hip extension    Hip abduction 4 2+  Hip adduction    Hip internal rotation 4 3+  Hip external rotation 4 3+  Knee flexion    Knee extension    Ankle dorsiflexion    Ankle plantarflexion    Ankle inversion    Ankle eversion     (Blank rows = not tested)  LOWER EXTREMITY SPECIAL TESTS:  Hip special tests: Trendelenburg test: positive left  FUNCTIONAL TESTS:  30 Second Sit to Stand: 8 reps  GAIT: Distance walked: 68ft Assistive device utilized: None Level of assistance: Complete Independence Comments: trendelenburg gait L                                                                                                                              TREATMENT DATE:  08/23/24 Therapeutic Exercise:  Nustep x5 min  clamshell YTB 3x8x3s hip marches YTB 2x6x3s BL  Seated RTB Leg extension 2x8x3s Seated RTB hamstring curl 2x8x3s   Therapeutic Activity: HEP reassessment and update RTB crab walks 2x12 feet Step up 4 inch x8x3s BUE support (left leg up first and right leg down first), x4  no UE support (Cuing on not letting hip drop) Counter top hip marches 2 finger support x8x3s      08/16/24 Therapeutic Exercise:  Nustep x5 min  RTB clamshell x8x3s, YTB 2x8x3s hip marches YTB 2x6x3s BL  Seated RTB Leg extension 2x8x3s Seated RTB hamstring curl 2x8x3s  Therapeutic Activity: HEP reassessment and update YTB crab walks 2x12 feet Step up 6 inch 2x8x3s BUE support (left leg up first and right leg down first) YTB counter leg extensions 2x8x3s BUE support    [facility] Adult PT Treatment:                                                DATE: 08/10/24  Nustep L5 x 5 minutes  Left step up with cues to correct hip drop Hip hikes from edge of step  Hip hike with hip circles- edge of step  Side lying clam red band Side hip abduction- unable Side reverse clam- unable Seated ball squeeze with IR AROM     [facility] Adult PT Treatment:  DATE: 08/09/24 NuStep lvl 5 UE/LE x 4 min for functional activity Bridge with blue band 3x10 Hooklying clamshell 2x15 blue band Supine SLR 2x10 L Hookling ball squeeze 2x10 - 5 hold STS 3x10 GTB around knees Step up 6in fwd L leading 2x10 1 UE Lateral walk YTB x 3 laps at counter Standing hip abd 2x10 YTB  [facility] Adult PT Treatment:                                                DATE: 08/02/24 Therapeutic Exercise: NuStep lvl 5 UE/LE x 4 min for functional activity Hooklying clamshell 2x15 GTB Bridge with GTB 3x10 Supine SLR 2x10 L STS 3x10 GTB around knees Lateral walk YTB x 3 laps at counter Standing hip abd/ext 2x10 YTB  [facility] Adult PT Treatment:                                                DATE: 07/20/24 Therapeutic Exercise: STS x 10 Hooklying clamshell x 10 GTB Bridge with GTB x 5  PATIENT EDUCATION:  Education details: eval findings, LEFS, HEP, POC Person educated: Patient Education method: Explanation, Demonstration, and Handouts Education comprehension: verbalized  understanding and returned demonstration  HOME EXERCISE PROGRAM: Access Code: [email or web address] URL: [email or web address] Date: 08/02/2024 Prepared by: Alm Kingdom  Exercises - Sit to Stand  - 1 x daily - 7 x weekly - 2 sets - 10 reps - Hooklying Clamshell with Resistance  - 1 x daily - 7 x weekly - 3 sets - 10 reps - blue band hold - Supine Bridge with Resistance Band  - 1 x daily - 7 x weekly - 3 sets - 10 reps - blue band hold - Side Stepping with Resistance at Ankles and Counter Support  - 1 x daily - 7 x weekly - 3-4 reps - yellow band hold  ASSESSMENT:  CLINICAL IMPRESSION: Patient tolerated treatment with no significant increases in pain with progressions in BL hip loading via isolated and functional exercises. Current deficits include: L hip strength, activity tolerance, and excessive pain. As a result, patient would continue to benefit from skilled PT to address said deficits via plan below.    EVAL: Patient is a 48 y.o. female who was seen today for physical therapy evaluation and treatment for subacute L hip pain and discomfort. Physical findings are consistent with MD impression as pt demonstrates antalgic trendelenburg gait, decreased lateral gluteal muscle strength, and pain to palpation of L greater trochanteric muscle group. LEFS score shows severe disability in performance of home ADLs and higher level activities. Pt would benefit from skilled PT services in order to decrease pain and improve gait/standing tolerance.   OBJECTIVE IMPAIRMENTS: Abnormal gait, decreased activity tolerance, decreased balance, decreased endurance, decreased mobility, difficulty walking, decreased ROM, decreased strength, and pain.   ACTIVITY LIMITATIONS: carrying, lifting, standing, squatting, stairs, transfers, and locomotion level  PARTICIPATION LIMITATIONS: meal prep, cleaning, driving, shopping, community activity, occupation, yard work, and school  PERSONAL FACTORS: Time since onset  of injury/illness/exacerbation and 1-2 comorbidities: DM I,  L THA and internal fixation are also affecting patient's functional outcome.   REHAB POTENTIAL: Good  CLINICAL DECISION MAKING: Evolving/moderate complexity  EVALUATION COMPLEXITY: Moderate   GOALS: Goals reviewed  with patient? No  SHORT TERM GOALS: Target date: 08/10/2024   Pt will be compliant and knowledgeable with initial HEP for improved comfort and carryover Baseline: initial HEP given  Goal status: MET   2.  Pt will self report left hip pain no greater than 7/10 for improved comfort and functional ability Baseline: 10/10 at worst Goal status: ONGOING  LONG TERM GOALS: Target date: 09/14/2024   Pt will improve LEFS to no less than 40/80 as proxy for functional improvement with home ADLs and higher level community activity Baseline: 24/80 Goal status: INITIAL   2.  Pt will self report left hip pain no greater than 4/10 for improved comfort and functional ability Baseline: 10/10 at worst Goal status: INITIAL   3.  Pt will increase 30 Second Sit to Stand rep count to no less than 10 reps for improved balance, strength, and functional mobility Baseline: 8 reps  Goal status: INITIAL   4.  Pt will improve L hip MMT to at least 4/5 for improved comfort and functional mobility while decreasing pain Baseline: see chart Goal status: INITIAL   PLAN:  PT FREQUENCY: 1-2x/week  PT DURATION: 8 weeks  PLANNED INTERVENTIONS: 97164- PT Re-evaluation, 97110-Therapeutic exercises, 97530- Therapeutic activity, [id number]- Neuromuscular re-education, 97535- Self Care, [id number]- Manual therapy, [id number]- Gait training, [phone]- Electrical stimulation (unattended), [id number]- Electrical stimulation (manual), 20560 (1-2 muscles), 20561 (3+ muscles)- Dry Needling, and Patient/Family education  PLAN FOR NEXT SESSION: assess HEP response, proximal hip strengthening via isolated and functional exercises. Continue improving hip abduction and closed  chain WB tolerance.   Referring diagnosis?  S03.357 (ICD-10-CM) - Status post total replacement of left hip M25.80 (ICD-10-CM) - Heterotopic ossification of joint  Treatment diagnosis? (if different than referring diagnosis)  Pain in left hip Muscle weakness (generalized) Other abnormalities of gait and mobility  What was this (referring dx) caused by? [x]  Surgery []  Fall [x]  Ongoing issue []  Arthritis []  Other: ____________  Laterality: []  Rt [x]  Lt []  Both  Check all possible CPT codes:  *CHOOSE 10 OR LESS*    See Planned Interventions listed in the Plan section of the Evaluation.    [place] Odessia Scot  PT, DPT   "

## 2024-08-26 ENCOUNTER — Ambulatory Visit

## 2024-08-27 ENCOUNTER — Ambulatory Visit: Admitting: Physical Therapy

## 2024-09-21 ENCOUNTER — Other Ambulatory Visit: Payer: Self-pay

## 2024-09-21 ENCOUNTER — Ambulatory Visit
Admission: EM | Admit: 2024-09-21 | Discharge: 2024-09-21 | Disposition: A | Discharge status: Home / Self Care | Attending: Physician Assistant | Admitting: Physician Assistant

## 2024-09-21 DIAGNOSIS — R0989 Other specified symptoms and signs involving the circulatory and respiratory systems: Secondary | ICD-10-CM | POA: Diagnosis not present

## 2024-09-21 DIAGNOSIS — U071 COVID-19: Secondary | ICD-10-CM

## 2024-09-21 LAB — POC SOFIA SARS ANTIGEN FIA: SARS Coronavirus 2 Ag: POSITIVE — AB

## 2024-09-21 LAB — POCT INFLUENZA A/B
Influenza A, POC: NEGATIVE
Influenza B, POC: NEGATIVE

## 2024-09-21 LAB — POCT RAPID STREP A (OFFICE): Rapid Strep A Screen: NEGATIVE

## 2024-09-21 MED ORDER — ALBUTEROL SULFATE HFA 108 (90 BASE) MCG/ACT IN AERS: 1.0000 | INHALATION_SPRAY | Freq: Four times a day (QID) | RESPIRATORY_TRACT | 0 refills | Status: AC | PRN

## 2024-09-21 MED ORDER — FLUTICASONE PROPIONATE 50 MCG/ACT NA SUSP: 1.0000 | Freq: Every day | NASAL | 0 refills | Status: AC

## 2024-09-21 MED ORDER — LIDOCAINE VISCOUS HCL 2 % MT SOLN: 15.0000 mL | OROMUCOSAL | 0 refills | Status: AC | PRN

## 2024-09-21 NOTE — ED Triage Notes (Signed)
 Pt presents with c/o nasal congestion, sore throat, fevers, chills, and headaches. This is day five of symptoms. Highest reported temperature at home has been 102.5 F. Has been taking OTC Tylenol  + Mucinex  for sxs at home. Minimal improvement with medications. Currently rates overall pain a 10/10. Has a headache. Denies sick contacts.

## 2024-09-21 NOTE — ED Provider Notes (Signed)
 [facility]    CSN: [id number] Arrival date & time: 09/21/24  1441      History   Chief Complaint Chief Complaint  Patient presents with   Fever   Sore Throat   Nasal Congestion    HPI [name] is a 49 y.o. female.   HPI  Pt is here today with concerns of nasal congestion, sore throat, coughing, fever, headache  She reports symptoms started on Friday, 09/17/24 She reports tmax of 102.5 - responds to tylenol   She denies previous hx of asthma or breathing conditions but does endorse hx of diabetes   Interventions: Mucinex  cold and flu, Tylenol    Pt declines offered Toradol  injection for headache pain   Past Medical History:  Diagnosis Date   Abscess 11/14/2012   RIGHT FINGER   Anemia    after surgeries   Anxiety    Arthritis    Cataracts, bilateral    Chronic back pain    Complication of anesthesia    has had to have blood transfusions after several surgeries   Diabetes mellitus    type 1   Diabetic neuropathy (HCC)    Herpes virus disease    Hip fracture (HCC)    left side   History of blood transfusion 09/17/2011   left  hip- pinning   Neuromuscular disorder (HCC)    neuropathy   Shortness of breath dyspnea    with exertion   Urinary tract infection    hx of    Patient Active Problem List   Diagnosis Date Noted   Neuralgia 01/30/2022   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   S/P repeat low transverse C-section 09/10/2013   Cellulitis and abscess 12/22/2012   Diabetes (HCC) 11/25/2012    Past Surgical History:  Procedure Laterality Date   BILATERAL SALPINGECTOMY     CATARACT EXTRACTION     left   CATARACT EXTRACTION W/PHACO  06/12/2012   Procedure: CATARACT EXTRACTION PHACO AND INTRAOCULAR LENS PLACEMENT (IOC);  Surgeon: Jestine Bunnell, MD;  Location: [facility] OR;  Service: Ophthalmology;  Laterality: Right;   CATARACT EXTRACTION W/PHACO  06/15/2012   Procedure: CATARACT EXTRACTION PHACO AND  INTRAOCULAR LENS PLACEMENT (IOC);  Surgeon: Jestine Bunnell, MD;  Location: [facility] OR;  Service: Ophthalmology;  Laterality: Left;   CESAREAN SECTION     x 3   CESAREAN SECTION WITH BILATERAL TUBAL LIGATION Bilateral 09/10/2013   Procedure: REPEAT CESAREAN SECTION WITH BILATERAL TUBAL LIGATION;  Surgeon: Jon CINDERELLA Rummer, MD;  Location: [facility] ORS;  Service: Obstetrics;  Laterality: Bilateral;   CHOLECYSTECTOMY N/A 04/30/2023   Procedure: LAPAROSCOPIC CHOLECYSTECTOMY;  Surgeon: Dasie Leonor CROME, MD;  Location: [facility] OR;  Service: General;  Laterality: N/A;   EYE SURGERY Bilateral    FRACTURE SURGERY     surgery left hip   HAND SURGERY Right 11/2012   I & D EXTREMITY Right 11/23/2012   Procedure: IRRIGATION AND DEBRIDEMENT EXTREMITY;  Surgeon: Alm DELENA Hummer, MD;  Location: [facility] OR;  Service: Orthopedics;  Laterality: Right;   I & D EXTREMITY Right 11/30/2012   Procedure: IRRIGATION AND DEBRIDEMENT Right hand Abscess;  Surgeon: Alm DELENA Hummer, MD;  Location: [facility] OR;  Service: Orthopedics;  Laterality: Right;   TOTAL HIP ARTHROPLASTY Left 10/12/2014   Procedure: TOTAL HIP ARTHROPLASTY;  Surgeon: Jerona Harden GAILS, MD;  Location: [facility] OR;  Service: Orthopedics;  Laterality: Left;    OB History     Gravida  4   Para  3   Term  1   Preterm      AB  1   Living  3      SAB      IAB      Ectopic      Multiple      Live Births  1            Home Medications    Prior to Admission medications  Medication Sig Start Date End Date Taking? Authorizing Provider  albuterol  (VENTOLIN  HFA) 108 (90 Base) MCG/ACT inhaler Inhale 1-2 puffs into the lungs every 6 (six) hours as needed for wheezing or shortness of breath. 09/21/24  Yes Isaul Landi E, PA-C  fluticasone  (FLONASE ) 50 MCG/ACT nasal spray Place 1 spray into both nostrils daily. 09/21/24  Yes Winthrop Shannahan E, PA-C  lidocaine  (XYLOCAINE ) 2 % solution Use as directed 15 mLs in the mouth or throat as needed. 09/21/24  Yes Jibran Crookshanks E, PA-C  aspirin  EC 325  MG tablet Take 1 tablet (325 mg total) by mouth daily. Patient not taking: Reported on 04/22/2023 10/12/14   Harden Jerona GAILS, MD  benzonatate  (TESSALON ) 100 MG capsule Take 1-2 capsules (100-200 mg total) by mouth 3 (three) times daily as needed. Patient not taking: Reported on 04/22/2023 04/08/21   Christopher Savannah, PA-C  cetirizine  (ZYRTEC  ALLERGY) 10 MG tablet Take 1 tablet (10 mg total) by mouth daily. Patient not taking: Reported on 04/22/2023 04/08/21   Christopher Savannah, PA-C  Cholecalciferol (VITAMIN D3 PO) Take 1 tablet by mouth daily.    [provider]  Continuous Glucose Sensor (FREESTYLE LIBRE 3 SENSOR) MISC USE AS DIRECTED THREE TIMES DAILY AND AS NEEDED GLUCOSE TESTING ON INSULIN  10/12/23   [provider]  cyclobenzaprine  (FLEXERIL ) 10 MG tablet Take 10 mg by mouth 3 (three) times daily as needed for muscle spasms.    [provider]  dextromethorphan-guaiFENesin  (MUCINEX  DM) 30-600 MG 12hr tablet Take 1 tablet by mouth 2 (two) times daily. Patient not taking: Reported on 04/22/2023 12/06/22   Lynwood Lenis, PA-C  ferrous sulfate  325 (65 FE) MG tablet Take 325 mg by mouth daily with breakfast. Patient not taking: Reported on 04/22/2023    [provider]  fluconazole  (DIFLUCAN ) 200 MG tablet Take 1 tablet (200 mg total) by mouth daily. Patient not taking: Reported on 04/22/2023 06/24/16   Marguerite Willma LABOR, CNM  gabapentin  (NEURONTIN ) 400 MG capsule Take 200 mg by mouth 2 (two) times daily.    [provider]  insulin  aspart (NOVOLOG ) 100 UNIT/ML injection Inject 0-3 Units into the skin 3 (three) times daily as needed for high blood sugar.    [provider]  insulin  degludec (TRESIBA FLEXTOUCH) 100 UNIT/ML FlexTouch Pen Inject 50 Units into the skin daily.    [provider]  meloxicam (MOBIC) 15 MG tablet Take 15 mg by mouth daily as needed for pain.    [provider]  metroNIDAZOLE  (METROGEL  VAGINAL) 0.75 % vaginal gel Place 1  Applicatorful vaginally 2 (two) times a week. For 4-6 months. Patient taking differently: Place 1 Applicatorful vaginally daily as needed (when instructed by MD). 06/24/16   Marguerite Willma A, CNM  omeprazole (PRILOSEC) 20 MG capsule Take 20 mg by mouth daily.    [provider]  ondansetron  (ZOFRAN ) 4 MG tablet Take 1 tablet (4 mg total) by mouth every 8 (eight) hours as needed for nausea or vomiting. Patient not taking: Reported on 04/22/2023 12/06/22   Lynwood Lenis, PA-C  ondansetron  (ZOFRAN -ODT) 4 MG disintegrating tablet Take 1 tablet (4 mg total) by mouth every 8 (eight) hours as needed for nausea or vomiting. 02/14/23   Vicky Charleston, PA-C  pseudoephedrine  (SUDAFED) 60 MG tablet Take 1 tablet (60 mg total) by mouth every 8 (eight) hours as needed for congestion. Patient not taking: Reported on 04/22/2023 04/08/21   Christopher Savannah, PA-C  simvastatin  (ZOCOR ) 10 MG tablet Take 10 mg by mouth daily.    [provider]  terconazole  (TERAZOL 7 ) 0.4 % vaginal cream Place 1 applicator vaginally at bedtime. Patient not taking: Reported on 04/22/2023 06/24/16   Marguerite Caddy A, CNM  tirzepatide  (MOUNJARO ) 10 MG/0.5ML Pen Inject 10 mg into the skin every Sunday.    [provider]  tirzepatide  (MOUNJARO ) 12.5 MG/0.5ML Pen Inject 12.5 mg into the skin once a week. Patient not taking: Reported on 04/22/2023 02/18/23     valACYclovir  (VALTREX ) 500 MG tablet Take 1 tablet (500 mg total) by mouth 2 (two) times daily. For 2 weeks. Patient taking differently: Take 500 mg by mouth as needed. For 2 weeks. 07/18/17   Marguerite Caddy A, CNM  zolpidem  (AMBIEN ) 10 MG tablet Take 10 mg by mouth at bedtime.    [provider]    Family History Family History  Problem Relation Age of Onset   Diabetes Mother    Heart disease Mother    Heart attack Father    Asthma Son    Healthy Son    Asthma Son    Healthy Son    Healthy Daughter     Social History Social  History[1]   Allergies   Phenergan [promethazine hcl] and Tramadol    Review of Systems Review of Systems  Constitutional:  Positive for chills and fever.  HENT:  Positive for congestion, ear pain, rhinorrhea and sore throat. Negative for postnasal drip.   Respiratory:  Positive for cough and shortness of breath. Negative for wheezing.   Gastrointestinal:  Negative for diarrhea, nausea and vomiting.  Musculoskeletal:  Positive for myalgias.  Neurological:  Positive for headaches.     Physical Exam Triage Vital Signs ED Triage Vitals  Encounter Vitals Group     BP 09/21/24 1626 124/81     Girls Systolic BP Percentile --      Girls Diastolic BP Percentile --      Boys Systolic BP Percentile --      Boys Diastolic BP Percentile --      Pulse Rate 09/21/24 1626 88     Resp 09/21/24 1626 18     Temp 09/21/24 1626 98.2 F (36.8 C)     Temp Source 09/21/24 1626 Oral     SpO2 09/21/24 1626 100 %     Weight 09/21/24 1626 203 lb (92.1 kg)     Height 09/21/24 1626 5' 2 (1.575 m)     Head Circumference --      Peak Flow --      Pain Score 09/21/24 1639 10     Pain Loc --      Pain Education --      Exclude from Growth Chart --    No data found.  Updated Vital Signs BP 124/81 (BP Location: Right Arm)   Pulse 88   Temp 98.2 F (36.8 C) (Oral)   Resp 18   Ht 5' 2 (1.575 m)   Wt 203 lb (92.1 kg)   LMP 09/12/2024 (Exact Date)   SpO2 100%   BMI 37.13 kg/m  Visual Acuity Right Eye Distance:   Left Eye Distance:   Bilateral Distance:    Right Eye Near:   Left Eye Near:    Bilateral Near:     Physical Exam Vitals reviewed.  Constitutional:      General: She is awake.     Appearance: Normal appearance. She is well-developed and well-groomed.  HENT:     Head: Normocephalic and atraumatic.     Right Ear: Hearing, tympanic membrane and ear canal normal.     Left Ear: Hearing, tympanic membrane and ear canal normal.     Mouth/Throat:     Lips: Pink.     Mouth:  Mucous membranes are moist.     Pharynx: Oropharynx is clear. Uvula midline. No pharyngeal swelling, oropharyngeal exudate, posterior oropharyngeal erythema, uvula swelling or postnasal drip.  Cardiovascular:     Rate and Rhythm: Normal rate and regular rhythm.     Pulses: Normal pulses.          Radial pulses are 2+ on the right side and 2+ on the left side.     Heart sounds: Normal heart sounds. No murmur heard.    No friction rub. No gallop.  Pulmonary:     Effort: Pulmonary effort is normal.     Breath sounds: Normal breath sounds. No decreased air movement. No decreased breath sounds, wheezing, rhonchi or rales.  Musculoskeletal:     Cervical back: Normal range of motion and neck supple.  Lymphadenopathy:     Head:     Right side of head: No submental, submandibular or preauricular adenopathy.     Left side of head: No submental, submandibular or preauricular adenopathy.     Cervical:     Right cervical: No superficial cervical adenopathy.    Left cervical: No superficial cervical adenopathy.     Upper Body:     Right upper body: No supraclavicular adenopathy.     Left upper body: No supraclavicular adenopathy.  Neurological:     Mental Status: She is alert.  Psychiatric:        Behavior: Behavior is cooperative.      UC Treatments / Results  Labs (all labs ordered are listed, but only abnormal results are displayed) Labs Reviewed  POC SOFIA SARS ANTIGEN FIA - Abnormal; Notable for the following components:      Result Value   SARS Coronavirus 2 Ag Positive (*)    All other components within normal limits  POCT RAPID STREP A (OFFICE)  POCT INFLUENZA A/B    EKG   Radiology No results found.  Procedures Procedures (including critical care time)  Medications Ordered in UC Medications - No data to display  Initial Impression / Assessment and Plan / UC Course  I have reviewed the triage vital signs and the nursing notes.  Pertinent labs & imaging results that  were available during my care of the patient were reviewed by me and considered in my medical decision making (see chart for details).      Final Clinical Impressions(s) / UC Diagnoses   Final diagnoses:  Symptoms of upper respiratory infection (URI)  COVID-19   Patient is here today with concerns of nasal congestion, sore throat, fevers chills and headaches.  She reports that today is day 5 of symptoms.  Rapid testing was positive for COVID.  Results discussed with patient during visit.  Reviewed with patient that since she is on day 5 of symptoms she is outside the window for antiviral medications and  I recommend supportive care for her at this time.  Will send albuterol  rescue inhaler to help with coughing and shortness of breath, viscous lidocaine  to assist with sore throat and Flonase  nasal spray.  Reviewed that she can continue OTC medications for symptomatic relief.  Quarantine and masking precautions reviewed with patient to assist with preventing transmission.  ED and return precautions reviewed and provided in AVS.  Follow-up as needed.    Discharge Instructions      You were seen today for concerns of nasal congestion, headache, sore throat and coughing. Your testing was positive for COVID-19. You are currently on day 5 of symptoms so you are outside the recommended therapeutic window for antiviral medication such as Paxlovid.  To help manage your symptoms I recommend over-the-counter medications such as the following:  DayQuil/NyQuil Alka-Seltzer day and night TheraFlu day and night  *If you have High blood pressure I recommend taking Mucinex , Robitussin, and Acetaminophen  rather than the combination medications. Flonase  nasal spray  Nasal saline flushes/rinses Humidifier at night Warm tea with honey Ibuprofen  as needed  Please make sure that you are getting plenty of rest and staying well-hydrated If you develop any of the following symptoms please go to the emergency  room: Significant difficulty breathing, chest pain, fevers that are not responding to acetaminophen  or ibuprofen , swelling of one of your arms or legs, inability to eat or drink leading to dehydration, confusion, loss of consciousness.      ED Prescriptions     Medication Sig Dispense Auth. Provider   albuterol  (VENTOLIN  HFA) 108 (90 Base) MCG/ACT inhaler Inhale 1-2 puffs into the lungs every 6 (six) hours as needed for wheezing or shortness of breath. 8 g Valecia Beske E, PA-C   lidocaine  (XYLOCAINE ) 2 % solution Use as directed 15 mLs in the mouth or throat as needed. 100 mL Zyier Dykema E, PA-C   fluticasone  (FLONASE ) 50 MCG/ACT nasal spray Place 1 spray into both nostrils daily. 11.1 mL Bexton Haak E, PA-C      PDMP not reviewed this encounter.     [1]  Social History Tobacco Use   Smoking status: Never    Passive exposure: Past   Smokeless tobacco: Never  Vaping Use   Vaping status: Never Used  Substance Use Topics   Alcohol use: No    Alcohol/week: 0.0 standard drinks of alcohol   Drug use: No     Jadian Karman, Rocky BRAVO, PA-C 09/21/24 1801  "

## 2024-09-21 NOTE — Discharge Instructions (Signed)
 You were seen today for concerns of nasal congestion, headache, sore throat and coughing. Your testing was positive for COVID-19. You are currently on day 5 of symptoms so you are outside the recommended therapeutic window for antiviral medication such as Paxlovid.  To help manage your symptoms I recommend over-the-counter medications such as the following:  DayQuil/NyQuil Alka-Seltzer day and night TheraFlu day and night  *If you have High blood pressure I recommend taking Mucinex , Robitussin, and Acetaminophen  rather than the combination medications. Flonase  nasal spray  Nasal saline flushes/rinses Humidifier at night Warm tea with honey Ibuprofen  as needed  Please make sure that you are getting plenty of rest and staying well-hydrated If you develop any of the following symptoms please go to the emergency room: Significant difficulty breathing, chest pain, fevers that are not responding to acetaminophen  or ibuprofen , swelling of one of your arms or legs, inability to eat or drink leading to dehydration, confusion, loss of consciousness.

## 2024-09-24 LAB — COLOGUARD

## 2024-10-15 ENCOUNTER — Other Ambulatory Visit: Payer: Self-pay | Admitting: Physician Assistant

## 2024-10-18 NOTE — Telephone Encounter (Signed)
 UC patient Requested Prescriptions  Pending Prescriptions Disp Refills   fluticasone  (FLONASE ) 50 MCG/ACT nasal spray [Pharmacy Med Name: FLUTICASONE  PROP 50 MCG SPRAY] 48 mL 1    Sig: SPRAY 1 SPRAY INTO BOTH NOSTRILS DAILY.     Ear, Nose, and Throat: Nasal Preparations - Corticosteroids Failed - 10/18/2024  2:00 PM      Failed - Valid encounter within last 12 months    Recent Outpatient Visits   None
